# Patient Record
Sex: Female | Born: 1935
Health system: Southern US, Community
[De-identification: ages and names within clinical notes are randomized; demographics above are authoritative.]

## PROBLEM LIST (undated history)

## (undated) DIAGNOSIS — E739 Lactose intolerance, unspecified: Secondary | ICD-10-CM

## (undated) DIAGNOSIS — I10 Essential (primary) hypertension: Secondary | ICD-10-CM

## (undated) DIAGNOSIS — F32A Depression, unspecified: Secondary | ICD-10-CM

## (undated) DIAGNOSIS — Z87440 Personal history of urinary (tract) infections: Secondary | ICD-10-CM

## (undated) DIAGNOSIS — B019 Varicella without complication: Secondary | ICD-10-CM

## (undated) DIAGNOSIS — A630 Anogenital (venereal) warts: Secondary | ICD-10-CM

## (undated) DIAGNOSIS — K5792 Diverticulitis of intestine, part unspecified, without perforation or abscess without bleeding: Secondary | ICD-10-CM

## (undated) DIAGNOSIS — K589 Irritable bowel syndrome without diarrhea: Secondary | ICD-10-CM

## (undated) DIAGNOSIS — J301 Allergic rhinitis due to pollen: Secondary | ICD-10-CM

## (undated) DIAGNOSIS — M1990 Unspecified osteoarthritis, unspecified site: Secondary | ICD-10-CM

## (undated) DIAGNOSIS — K219 Gastro-esophageal reflux disease without esophagitis: Secondary | ICD-10-CM

## (undated) DIAGNOSIS — R32 Unspecified urinary incontinence: Secondary | ICD-10-CM

## (undated) DIAGNOSIS — J45909 Unspecified asthma, uncomplicated: Secondary | ICD-10-CM

## (undated) DIAGNOSIS — G629 Polyneuropathy, unspecified: Secondary | ICD-10-CM

## (undated) HISTORY — DX: Essential (primary) hypertension: I10

## (undated) HISTORY — DX: Varicella without complication: B01.9

## (undated) HISTORY — DX: Irritable bowel syndrome, unspecified: K58.9

## (undated) HISTORY — DX: Lactose intolerance, unspecified: E73.9

## (undated) HISTORY — PX: TONSILLECTOMY AND ADENOIDECTOMY: SUR1326

## (undated) HISTORY — DX: Gastro-esophageal reflux disease without esophagitis: K21.9

## (undated) HISTORY — DX: Unspecified asthma, uncomplicated: J45.909

## (undated) HISTORY — DX: Depression, unspecified: F32.A

## (undated) HISTORY — DX: Allergic rhinitis due to pollen: J30.1

## (undated) HISTORY — PX: PARTIAL COLECTOMY: SHX5273

## (undated) HISTORY — DX: Anogenital (venereal) warts: A63.0

## (undated) HISTORY — DX: Personal history of urinary (tract) infections: Z87.440

## (undated) HISTORY — DX: Diverticulitis of intestine, part unspecified, without perforation or abscess without bleeding: K57.92

## (undated) HISTORY — PX: ABDOMINAL HYSTERECTOMY: SHX81

## (undated) HISTORY — DX: Unspecified urinary incontinence: R32

## (undated) HISTORY — DX: Unspecified osteoarthritis, unspecified site: M19.90

---

## 2010-07-19 HISTORY — PX: OTHER SURGICAL HISTORY: SHX169

## 2018-07-19 HISTORY — PX: OTHER SURGICAL HISTORY: SHX169

## 2020-04-09 LAB — COMPREHENSIVE METABOLIC PANEL
Albumin: 4.5 (ref 3.5–5.0)
Calcium: 9.5 (ref 8.7–10.7)
GFR calc Af Amer: 70
GFR calc non Af Amer: 61
Globulin: 2

## 2020-04-09 LAB — BASIC METABOLIC PANEL
BUN: 12 (ref 4–21)
CO2: 26 — AB (ref 13–22)
Chloride: 100 (ref 99–108)
Creatinine: 0.9 (ref 0.5–1.1)
Glucose: 84
Potassium: 5 (ref 3.4–5.3)
Sodium: 141 (ref 137–147)

## 2020-04-09 LAB — HEPATIC FUNCTION PANEL
ALT: 13 (ref 7–35)
AST: 16 (ref 13–35)
Alkaline Phosphatase: 113 (ref 25–125)
Bilirubin, Total: 0.4

## 2020-04-09 LAB — CBC AND DIFFERENTIAL
HCT: 37 (ref 36–46)
Hemoglobin: 12.6 (ref 12.0–16.0)
Neutrophils Absolute: 4.7
Platelets: 245 (ref 150–399)
WBC: 7.9

## 2020-04-09 LAB — LIPID PANEL
Cholesterol: 114 (ref 0–200)
HDL: 54 (ref 35–70)
LDL Cholesterol: 42
Triglycerides: 93 (ref 40–160)

## 2020-04-09 LAB — CBC: RBC: 3.71 — AB (ref 3.87–5.11)

## 2020-09-25 ENCOUNTER — Other Ambulatory Visit (HOSPITAL_COMMUNITY): Payer: Self-pay | Admitting: Family Medicine

## 2020-09-26 MED FILL — clonazePAM 0.5 MG TABS: 0.5 | 30 days supply | Qty: 60 | Fill #0

## 2020-10-15 ENCOUNTER — Encounter: Payer: Self-pay | Admitting: Internal Medicine

## 2020-10-15 ENCOUNTER — Other Ambulatory Visit: Payer: Self-pay

## 2020-10-15 ENCOUNTER — Other Ambulatory Visit (HOSPITAL_COMMUNITY): Payer: Self-pay | Admitting: Internal Medicine

## 2020-10-15 ENCOUNTER — Ambulatory Visit (INDEPENDENT_AMBULATORY_CARE_PROVIDER_SITE_OTHER): Payer: Medicare Other | Admitting: Internal Medicine

## 2020-10-15 DIAGNOSIS — J4540 Moderate persistent asthma, uncomplicated: Secondary | ICD-10-CM

## 2020-10-15 DIAGNOSIS — K591 Functional diarrhea: Secondary | ICD-10-CM

## 2020-10-15 DIAGNOSIS — B009 Herpesviral infection, unspecified: Secondary | ICD-10-CM

## 2020-10-15 DIAGNOSIS — J3089 Other allergic rhinitis: Secondary | ICD-10-CM

## 2020-10-15 DIAGNOSIS — R630 Anorexia: Secondary | ICD-10-CM

## 2020-10-15 DIAGNOSIS — E782 Mixed hyperlipidemia: Secondary | ICD-10-CM

## 2020-10-15 DIAGNOSIS — F5101 Primary insomnia: Secondary | ICD-10-CM

## 2020-10-15 DIAGNOSIS — R251 Tremor, unspecified: Secondary | ICD-10-CM

## 2020-10-15 MED ORDER — FLUTICASONE FUROATE-VILANTEROL 100-25 MCG/INH IN AEPB: 1.0000 | INHALATION_SPRAY | Freq: Every day | RESPIRATORY_TRACT | 11 refills | Status: DC

## 2020-10-15 MED ORDER — VALACYCLOVIR HCL 1 G PO TABS: 1000.0000 mg | ORAL_TABLET | Freq: Every day | ORAL | 3 refills | Status: DC

## 2020-10-15 MED FILL — BREO ELLIPTA 100-25 MCG INH: 100-25 | 30 days supply | Qty: 60 | Fill #0

## 2020-10-15 MED FILL — valACYclovir HCL 1 GM TABS: 1 | 30 days supply | Qty: 30 | Fill #0

## 2020-10-15 NOTE — Progress Notes (Signed)
Subjective:   Patient ID: Tina Riley, female    DOB: [birth year], 87 y.o.   MRN: [id number]  HPI  The patient is a new 87 YO female coming in for ongoing care of her asthma (having SOB a lot of the time, coughing and sneezing a lot, denies hearing wheezing, she does not go out a lot so hard to tell if this limits activity, does not limit her around her apartment, she is prescribed breztri but this was not approved as it was for copd and not asthma so she did not get, she has symbicort which she uses 1 puff in the morning, does not really remember to use in the evening, does not have rescue inhaler currently uses symbicort for that, denies night time awakening for this, moved from [place] about a month ago and allergies have been worse since then) and allergies (taking xyzal and this seems to be doing okay but worse in the last month since moving here, denies fevers or chills, constant nose dripping and sneezing often, denies SOB or facial pressure) and tremor (has had for a long time and taking propranolol for this, overall feels it is getting worse, not sure she should still be taking this) and new problems with poor sleep (in an apartment and not used to living so close to others, one resident in particular is loud and keeping her up at night, this makes her a little anxious, she is then tired during the day, some napping, slept only 3 hours last night) and appetite (seems to be eating less, losing some weight, previously was on remeron and does not recall if that worked or not). Some other chronic medical conditions not overly discussed but medications reviewed indications for each with patient. Also with ongoing problem of diarrhea (colectomy in the 80s and since that time she is having diarrhea and stomach pains, has GI provider and saw them within 1 year, note reviewed, she takes bentyl and this helps, she is eating a lot of ice cream, daily currently, denies blood in diarrhea, no fevers or  chills) also chronic HSV (taking acyclovir 6 pills daily, sometimes forgets 1-2 of these, gets pain with outbreak).    PMH, [facility], social history reviewed and updated  Review of Systems  Constitutional: Positive for appetite change and fatigue.  HENT: Positive for postnasal drip and sneezing.   Eyes: Negative.   Respiratory: Positive for shortness of breath. Negative for apnea, cough, choking, chest tightness, wheezing and stridor.   Cardiovascular: Negative for chest pain, palpitations and leg swelling.  Gastrointestinal: Positive for abdominal pain and diarrhea. Negative for abdominal distention, anal bleeding, blood in stool, constipation, nausea, rectal pain and vomiting.  Musculoskeletal: Negative.   Skin: Negative.   Neurological: Positive for tremors.  Psychiatric/Behavioral: Positive for sleep disturbance.    Objective:  Physical Exam Constitutional:      Appearance: She is well-developed.     Comments: thin  HENT:     Head: Normocephalic and atraumatic.  Cardiovascular:     Rate and Rhythm: Normal rate and regular rhythm.  Pulmonary:     Effort: Pulmonary effort is normal. No respiratory distress.     Breath sounds: Normal breath sounds. No wheezing or rales.  Abdominal:     General: Bowel sounds are normal. There is no distension.     Palpations: Abdomen is soft. There is no mass.     Tenderness: There is abdominal tenderness. There is no right CVA tenderness, left CVA tenderness, guarding  or rebound.     Hernia: No hernia is present.  Musculoskeletal:     Cervical back: Normal range of motion.  Skin:    General: Skin is warm and dry.  Neurological:     Mental Status: She is alert and oriented to person, place, and time.     Coordination: Coordination abnormal.     Vitals:   10/15/20 1008  BP: 118/80  Pulse: 68  Resp: 18  Temp: 97.9 F (36.6 C)  TempSrc: Oral  SpO2: 98%  Weight: 103 lb 12.8 oz (47.1 kg)  Height: 5' (1.524 m)   This visit occurred during  the SARS-CoV-2 public health emergency.  Safety protocols were in place, including screening questions prior to the visit, additional usage of staff PPE, and extensive cleaning of exam room while observing appropriate contact time as indicated for disinfecting solutions.   Assessment & Plan:  Visit time 35 minutes in face to face communication with patient and coordination of care, additional 35 minutes spent in record review, coordination or care, ordering tests, communicating/referring to other healthcare professionals, documenting in medical records all on the same day of the visit for total time 70 minutes spent on the visit.

## 2020-10-15 NOTE — Patient Instructions (Addendum)
We have sent in breo for the asthma to use 1 puff daily. You can still use the symbicort as a rescue inhaler in case you feel like you cannot breathe.  We have sent in valtrex to replace the acyclovir. This medicine is 1 pill daily and prevents the herpes virus.  We could think about adding remeron (mirtazepine) to help with sleep and with appetite.   Work on reducing the dairy and oils from the diet to see if this helps with the stomach.

## 2020-10-17 ENCOUNTER — Encounter: Payer: Self-pay | Admitting: Internal Medicine

## 2020-10-17 DIAGNOSIS — J45909 Unspecified asthma, uncomplicated: Secondary | ICD-10-CM | POA: Insufficient documentation

## 2020-10-17 DIAGNOSIS — B009 Herpesviral infection, unspecified: Secondary | ICD-10-CM | POA: Insufficient documentation

## 2020-10-17 DIAGNOSIS — R251 Tremor, unspecified: Secondary | ICD-10-CM | POA: Insufficient documentation

## 2020-10-17 DIAGNOSIS — E785 Hyperlipidemia, unspecified: Secondary | ICD-10-CM | POA: Insufficient documentation

## 2020-10-17 DIAGNOSIS — R197 Diarrhea, unspecified: Secondary | ICD-10-CM | POA: Insufficient documentation

## 2020-10-17 DIAGNOSIS — R630 Anorexia: Secondary | ICD-10-CM | POA: Insufficient documentation

## 2020-10-17 DIAGNOSIS — G4700 Insomnia, unspecified: Secondary | ICD-10-CM | POA: Insufficient documentation

## 2020-10-17 DIAGNOSIS — J309 Allergic rhinitis, unspecified: Secondary | ICD-10-CM | POA: Insufficient documentation

## 2020-10-17 NOTE — Assessment & Plan Note (Signed)
Suspect this may partially be adjustment to [place] and apartment living. Offered remeron to help and they will think about this.

## 2020-10-17 NOTE — Assessment & Plan Note (Signed)
She is not on controller medication currently. Rx breo to take 1 puff daily as she does not seem able to do BID medication. Continue symbicort as rescue for now and see her back in 1 month. Adjust as needed then.

## 2020-10-17 NOTE — Assessment & Plan Note (Signed)
Suspect essential tremor and she is on appropriate propranolol. Advised to continue this for now. Given other more urgent concerns we did not get in depth with this today and will follow up on this in 1 month. Given HR 68 dose increase of propranolol is not appropriate. We did talk briefly about how typically essential tremor is chronic and progressive in nature. Not noticeable on exam.

## 2020-10-17 NOTE — Assessment & Plan Note (Signed)
Taking crestor 5 mg daily. Checking records to see when labs are due for monitoring.

## 2020-10-17 NOTE — Assessment & Plan Note (Signed)
Taking xyzal and given other changes will wait on changes with this. It is pollen season and so worsening symptoms are not unusual.

## 2020-10-17 NOTE — Assessment & Plan Note (Signed)
Will change acyclovir to valtrex for less pills and simpler regimen of 1000 mg daily.

## 2020-10-17 NOTE — Assessment & Plan Note (Signed)
Due to extensive other conditions discussed today it was not possible to take an extensive history of this problem. She is still taking bentyl 4 times a day. Advised to stop dairy products to see if this helps. Will get records to clarify colectomy 80s reason. No signs of diverticulitis or infection today based on history.

## 2020-10-17 NOTE — Assessment & Plan Note (Signed)
Offered to resume remeron and they will think about this. We can discuss again at follow up in 1-2 months.

## 2020-10-24 ENCOUNTER — Encounter: Payer: Self-pay | Admitting: Internal Medicine

## 2020-10-27 ENCOUNTER — Other Ambulatory Visit (HOSPITAL_COMMUNITY): Payer: Self-pay

## 2020-10-27 MED ORDER — MOMETASONE FURO-FORMOTEROL FUM 100-5 MCG/ACT IN AERO
2.0000 | INHALATION_SPRAY | Freq: Two times a day (BID) | RESPIRATORY_TRACT | 5 refills | Status: DC
  Filled 2020-10-27: qty 13, 30d supply, fill #0

## 2020-10-29 ENCOUNTER — Other Ambulatory Visit (HOSPITAL_COMMUNITY): Payer: Self-pay

## 2020-10-29 ENCOUNTER — Telehealth: Payer: Self-pay | Admitting: Internal Medicine

## 2020-10-29 NOTE — Telephone Encounter (Signed)
    Patient states she does not plan on taking mometasone-formoterol (DULERA) 100-5 MCG/ACT AERO  She is fearful of possible side effects. Patient requesting Symbicort order  Please call

## 2020-11-03 ENCOUNTER — Other Ambulatory Visit (HOSPITAL_COMMUNITY): Payer: Self-pay

## 2020-11-03 MED ORDER — SYMBICORT 160-4.5 MCG/ACT IN AERO
2.0000 | INHALATION_SPRAY | Freq: Two times a day (BID) | RESPIRATORY_TRACT | 11 refills | Status: DC
  Filled 2020-11-03: qty 10.2, 30d supply, fill #0
  Filled 2020-12-28: qty 10.2, 30d supply, fill #1
  Filled 2021-03-05: qty 10.2, 30d supply, fill #2
  Filled 2021-06-05: qty 10.2, 30d supply, fill #3

## 2020-11-03 NOTE — Telephone Encounter (Signed)
See below

## 2020-11-03 NOTE — Telephone Encounter (Signed)
Ok,placed

## 2020-11-06 ENCOUNTER — Encounter: Payer: Self-pay | Admitting: Internal Medicine

## 2020-11-06 ENCOUNTER — Other Ambulatory Visit (HOSPITAL_COMMUNITY): Payer: Self-pay

## 2020-11-07 ENCOUNTER — Other Ambulatory Visit (HOSPITAL_COMMUNITY): Payer: Self-pay

## 2020-11-07 MED ORDER — DICYCLOMINE HCL 10 MG PO CAPS
10.0000 mg | ORAL_CAPSULE | Freq: Two times a day (BID) | ORAL | 0 refills | Status: DC | PRN
  Filled 2020-11-07: qty 40, 20d supply, fill #0

## 2020-11-20 ENCOUNTER — Other Ambulatory Visit (HOSPITAL_COMMUNITY): Payer: Self-pay

## 2020-11-20 MED FILL — Clonazepam Tab 0.5 MG: ORAL | 30 days supply | Qty: 60 | Fill #0 | Status: AC

## 2020-11-24 ENCOUNTER — Telehealth: Payer: Self-pay | Admitting: Internal Medicine

## 2020-11-24 ENCOUNTER — Other Ambulatory Visit (HOSPITAL_COMMUNITY): Payer: Self-pay

## 2020-11-24 ENCOUNTER — Encounter: Payer: Self-pay | Admitting: Internal Medicine

## 2020-11-24 MED FILL — Valacyclovir HCl Tab 1 GM: ORAL | 90 days supply | Qty: 90 | Fill #0 | Status: AC

## 2020-11-24 NOTE — Telephone Encounter (Signed)
Ok to refill? I don't see where you have prescribed the medication that is being requested.

## 2020-11-24 NOTE — Telephone Encounter (Signed)
rosuvastatin (CRESTOR) 5 MG tablet gabapentin (NEURONTIN) 100 MG capsule propranolol ER (INDERAL LA) 60 MG 24 hr capsule levocetirizine (XYZAL) 5 MG tablet  [facility] Phone:  [phone]  Fax:  [phone]     Last seen- 03.30.22 Next apt- 06.06.22

## 2020-11-25 ENCOUNTER — Other Ambulatory Visit (HOSPITAL_COMMUNITY): Payer: Self-pay

## 2020-11-25 MED ORDER — LEVOCETIRIZINE DIHYDROCHLORIDE 5 MG PO TABS
5.0000 mg | ORAL_TABLET | Freq: Every day | ORAL | 3 refills | Status: DC
  Filled 2020-11-25: qty 90, 90d supply, fill #0
  Filled 2021-02-16: qty 90, 90d supply, fill #1
  Filled 2021-04-19 – 2021-04-27 (×2): qty 90, 90d supply, fill #2

## 2020-11-25 MED ORDER — ROSUVASTATIN CALCIUM 5 MG PO TABS
5.0000 mg | ORAL_TABLET | Freq: Every day | ORAL | 3 refills | Status: DC
  Filled 2020-11-25: qty 90, 90d supply, fill #0
  Filled 2021-02-16: qty 90, 90d supply, fill #1
  Filled 2021-04-19 – 2021-04-27 (×2): qty 90, 90d supply, fill #2

## 2020-11-25 MED ORDER — PROPRANOLOL HCL ER 60 MG PO CP24
60.0000 mg | ORAL_CAPSULE | Freq: Every day | ORAL | 1 refills | Status: DC
  Filled 2020-11-25 (×3): qty 90, 90d supply, fill #0
  Filled 2021-02-16: qty 90, 90d supply, fill #1

## 2020-11-25 MED ORDER — GABAPENTIN 100 MG PO CAPS
100.0000 mg | ORAL_CAPSULE | Freq: Three times a day (TID) | ORAL | 0 refills | Status: DC
  Filled 2020-11-25: qty 270, 90d supply, fill #0

## 2020-11-25 NOTE — Addendum Note (Signed)
Addended by: Hillard Danker A on: 11/25/2020 09:05 AM   Modules accepted: Orders

## 2020-12-16 ENCOUNTER — Ambulatory Visit: Payer: Medicare Other | Admitting: Internal Medicine

## 2020-12-22 ENCOUNTER — Ambulatory Visit (INDEPENDENT_AMBULATORY_CARE_PROVIDER_SITE_OTHER): Payer: Medicare Other | Admitting: Internal Medicine

## 2020-12-22 ENCOUNTER — Encounter: Payer: Self-pay | Admitting: Internal Medicine

## 2020-12-22 ENCOUNTER — Other Ambulatory Visit: Payer: Self-pay

## 2020-12-22 VITALS — BP 122/70 | HR 62 | Temp 98.6°F | Resp 18 | Ht 60.0 in | Wt 97.6 lb

## 2020-12-22 DIAGNOSIS — E2839 Other primary ovarian failure: Secondary | ICD-10-CM

## 2020-12-22 DIAGNOSIS — K591 Functional diarrhea: Secondary | ICD-10-CM

## 2020-12-22 DIAGNOSIS — L659 Nonscarring hair loss, unspecified: Secondary | ICD-10-CM

## 2020-12-22 LAB — CBC
HCT: 37.3 % (ref 36.0–46.0)
Hemoglobin: 12.4 g/dL (ref 12.0–15.0)
MCHC: 33.3 g/dL (ref 30.0–36.0)
MCV: 100.4 fl — ABNORMAL HIGH (ref 78.0–100.0)
Platelets: 289 10*3/uL (ref 150.0–400.0)
RBC: 3.72 Mil/uL — ABNORMAL LOW (ref 3.87–5.11)
RDW: 13.4 % (ref 11.5–15.5)
WBC: 9.9 10*3/uL (ref 4.0–10.5)

## 2020-12-22 LAB — VITAMIN B12: Vitamin B-12: 243 pg/mL (ref 211–911)

## 2020-12-22 LAB — COMPREHENSIVE METABOLIC PANEL
ALT: 8 U/L (ref 0–35)
AST: 13 U/L (ref 0–37)
Albumin: 4 g/dL (ref 3.5–5.2)
Alkaline Phosphatase: 83 U/L (ref 39–117)
BUN: 14 mg/dL (ref 6–23)
CO2: 27 mEq/L (ref 19–32)
Calcium: 9.3 mg/dL (ref 8.4–10.5)
Chloride: 102 mEq/L (ref 96–112)
Creatinine, Ser: 0.94 mg/dL (ref 0.40–1.20)
GFR: 54.6 mL/min — ABNORMAL LOW (ref >60.00)
Glucose, Bld: 88 mg/dL (ref 70–99)
Potassium: 5.2 mEq/L — ABNORMAL HIGH (ref 3.5–5.1)
Sodium: 137 mEq/L (ref 135–145)
Total Bilirubin: 0.4 mg/dL (ref 0.2–1.2)
Total Protein: 6.8 g/dL (ref 6.0–8.3)

## 2020-12-22 LAB — VITAMIN D 25 HYDROXY (VIT D DEFICIENCY, FRACTURES): VITD: 27.17 ng/mL — ABNORMAL LOW (ref 30.00–100.00)

## 2020-12-22 LAB — TSH: TSH: 5.97 u[IU]/mL — ABNORMAL HIGH (ref 0.35–4.50)

## 2020-12-22 NOTE — Progress Notes (Signed)
   Subjective:   Patient ID: Tina Riley, female    DOB: 1931/09/07, 87 y.o.   MRN: [id number]  HPI The patient is an 87 YO female coming in for concerns about ongoing diarrhea (did not have time to discuss at last visit, chronic since bowel surgeries, has tried imodium and this helped for a few days, did not want to overuse, did not try stopping dairy as we discussed last time) and hair thinning (going on awhile noticed in the last 2-3 months more).   Review of Systems  Constitutional:  Positive for fatigue.  HENT: Negative.    Eyes: Negative.   Respiratory:  Negative for cough, chest tightness and shortness of breath.   Cardiovascular:  Negative for chest pain, palpitations and leg swelling.  Gastrointestinal:  Positive for diarrhea. Negative for abdominal distention, abdominal pain, constipation, nausea and vomiting.  Musculoskeletal: Negative.   Skin: Negative.   Neurological: Negative.   Psychiatric/Behavioral: Negative.     Objective:  Physical Exam Constitutional:      Appearance: She is well-developed.  HENT:     Head: Normocephalic and atraumatic.  Cardiovascular:     Rate and Rhythm: Normal rate and regular rhythm.  Pulmonary:     Effort: Pulmonary effort is normal. No respiratory distress.     Breath sounds: Normal breath sounds. No wheezing or rales.  Abdominal:     General: Bowel sounds are normal. There is no distension.     Palpations: Abdomen is soft.     Tenderness: There is no abdominal tenderness. There is no rebound.  Musculoskeletal:     Cervical back: Normal range of motion.  Skin:    General: Skin is warm and dry.  Neurological:     Mental Status: She is alert and oriented to person, place, and time.     Coordination: Coordination normal.    Vitals:   12/22/20 0817  BP: 122/70  Pulse: 62  Resp: 18  Temp: 98.6 F (37 C)  TempSrc: Oral  SpO2: 96%  Weight: 97 lb 9.6 oz (44.3 kg)  Height: 5' (1.524 m)    This visit occurred during the  SARS-CoV-2 public health emergency.  Safety protocols were in place, including screening questions prior to the visit, additional usage of staff PPE, and extensive cleaning of exam room while observing appropriate contact time as indicated for disinfecting solutions.   Assessment & Plan:

## 2020-12-22 NOTE — Patient Instructions (Addendum)
You can take the imodium 2-3 times per week to help with the diarrhea.  We will check the labs to see if we can find a cause for the hair thinning.

## 2020-12-25 DIAGNOSIS — L659 Nonscarring hair loss, unspecified: Secondary | ICD-10-CM | POA: Insufficient documentation

## 2020-12-25 NOTE — Assessment & Plan Note (Signed)
Suspect that this is from prior surgeries upon hearing more history. She got good relief with imodium so asked her to schedule this 2-3 times per week. Also if she wants to try dairy avoidance to see if this helps for 2-3 weeks.

## 2020-12-25 NOTE — Assessment & Plan Note (Signed)
Checking TSH, vitamin D and B12. Advised that beta blocker could be the cause but since this is helping more with tremor I would recommend to continue it.

## 2020-12-28 ENCOUNTER — Encounter: Payer: Self-pay | Admitting: Internal Medicine

## 2020-12-28 MED FILL — Clonazepam Tab 0.5 MG: ORAL | 30 days supply | Qty: 60 | Fill #1 | Status: AC

## 2020-12-29 ENCOUNTER — Other Ambulatory Visit (HOSPITAL_COMMUNITY): Payer: Self-pay

## 2021-01-05 ENCOUNTER — Other Ambulatory Visit: Payer: Self-pay | Admitting: Internal Medicine

## 2021-01-07 ENCOUNTER — Other Ambulatory Visit (HOSPITAL_COMMUNITY): Payer: Self-pay

## 2021-01-07 MED ORDER — DICYCLOMINE HCL 10 MG PO CAPS
10.0000 mg | ORAL_CAPSULE | Freq: Two times a day (BID) | ORAL | 0 refills | Status: DC | PRN
  Filled 2021-01-07: qty 40, 20d supply, fill #0

## 2021-01-16 ENCOUNTER — Encounter: Payer: Self-pay | Admitting: Internal Medicine

## 2021-01-16 ENCOUNTER — Other Ambulatory Visit (HOSPITAL_COMMUNITY): Payer: Self-pay

## 2021-01-16 MED ORDER — DICYCLOMINE HCL 10 MG PO CAPS
10.0000 mg | ORAL_CAPSULE | Freq: Two times a day (BID) | ORAL | 3 refills | Status: DC | PRN
  Filled 2021-01-16: qty 180, 90d supply, fill #0
  Filled 2021-04-27: qty 180, 90d supply, fill #1

## 2021-01-22 ENCOUNTER — Other Ambulatory Visit (HOSPITAL_COMMUNITY): Payer: Self-pay

## 2021-02-11 ENCOUNTER — Encounter: Payer: Self-pay | Admitting: Internal Medicine

## 2021-02-11 DIAGNOSIS — K591 Functional diarrhea: Secondary | ICD-10-CM

## 2021-02-16 ENCOUNTER — Other Ambulatory Visit (HOSPITAL_COMMUNITY): Payer: Self-pay

## 2021-02-16 ENCOUNTER — Other Ambulatory Visit: Payer: Self-pay

## 2021-02-17 ENCOUNTER — Other Ambulatory Visit (HOSPITAL_COMMUNITY): Payer: Self-pay

## 2021-02-17 ENCOUNTER — Other Ambulatory Visit: Payer: Self-pay | Admitting: Internal Medicine

## 2021-02-18 ENCOUNTER — Other Ambulatory Visit: Payer: Self-pay | Admitting: Internal Medicine

## 2021-02-18 ENCOUNTER — Other Ambulatory Visit (HOSPITAL_COMMUNITY): Payer: Self-pay

## 2021-02-18 NOTE — Telephone Encounter (Signed)
I'm not sure why she is taking this medication and we have not talked about this due to other medical concerns at our prior visits. Can we offer her appointment to discuss?

## 2021-02-18 NOTE — Telephone Encounter (Signed)
This has not been prescribed by you in the past. Last RF per controlled substance database: 12/29/20 Last OV: 12/22/20 Next OV: 05/04/21

## 2021-02-19 ENCOUNTER — Other Ambulatory Visit (HOSPITAL_COMMUNITY): Payer: Self-pay

## 2021-02-19 ENCOUNTER — Other Ambulatory Visit: Payer: Self-pay | Admitting: Internal Medicine

## 2021-02-23 ENCOUNTER — Other Ambulatory Visit (HOSPITAL_COMMUNITY): Payer: Self-pay

## 2021-02-23 ENCOUNTER — Other Ambulatory Visit: Payer: Self-pay

## 2021-02-23 ENCOUNTER — Ambulatory Visit (INDEPENDENT_AMBULATORY_CARE_PROVIDER_SITE_OTHER): Payer: Medicare Other | Admitting: Internal Medicine

## 2021-02-23 ENCOUNTER — Encounter: Payer: Self-pay | Admitting: Internal Medicine

## 2021-02-23 DIAGNOSIS — F419 Anxiety disorder, unspecified: Secondary | ICD-10-CM | POA: Diagnosis not present

## 2021-02-23 DIAGNOSIS — K591 Functional diarrhea: Secondary | ICD-10-CM | POA: Diagnosis not present

## 2021-02-23 DIAGNOSIS — K219 Gastro-esophageal reflux disease without esophagitis: Secondary | ICD-10-CM

## 2021-02-23 MED ORDER — CLONAZEPAM 0.5 MG PO TABS
0.5000 mg | ORAL_TABLET | Freq: Two times a day (BID) | ORAL | 5 refills | Status: DC | PRN
  Filled 2021-02-23: qty 60, 30d supply, fill #0
  Filled 2021-04-10: qty 60, 30d supply, fill #1
  Filled 2021-05-25: qty 60, 30d supply, fill #2
  Filled 2021-07-18: qty 60, 30d supply, fill #3

## 2021-02-23 MED ORDER — PANTOPRAZOLE SODIUM 40 MG PO TBEC
40.0000 mg | DELAYED_RELEASE_TABLET | Freq: Two times a day (BID) | ORAL | 3 refills | Status: DC
  Filled 2021-02-23: qty 180, 90d supply, fill #0
  Filled 2021-04-27 – 2021-06-12 (×2): qty 180, 90d supply, fill #1
  Filled 2021-12-14: qty 180, 90d supply, fill #2

## 2021-02-23 NOTE — Patient Instructions (Addendum)
We have refilled the protonix (pantoprazole) and the clonazepam today.  Try metamucil for the bowels regular for 1-2 weeks.

## 2021-02-23 NOTE — Progress Notes (Signed)
   Subjective:   Patient ID: Tina Riley, female    DOB: 02/02/1932, 87 y.o.   MRN: [id number]  HPI The patient is an 87 YO female coming in for anxiety. We had not discussed in depth previously due to other more concerning health concerns. Needs refill clonazepam which she uses for anxiety mostly in the evening which is when her anxiety is worse. Mostly 1 per day, rarely 2 per day. She has a lot of anxiety about her "colon attacks" with diarrhea. Has not taken imodium scheduled as suggested as she is concerned about SBO and constipation. Used to take fiber daily and is not. Cannot remember if this helped with her diarrhea. Some changes to diet.   Review of Systems  Constitutional: Negative.   HENT: Negative.    Eyes: Negative.   Respiratory:  Negative for cough, chest tightness and shortness of breath.   Cardiovascular:  Negative for chest pain, palpitations and leg swelling.  Gastrointestinal:  Positive for diarrhea. Negative for abdominal distention, abdominal pain, constipation, nausea and vomiting.  Musculoskeletal:  Positive for arthralgias.  Skin: Negative.   Neurological: Negative.   Psychiatric/Behavioral:  Positive for sleep disturbance. The patient is nervous/anxious.    Objective:  Physical Exam Constitutional:      Appearance: She is well-developed.     Comments: Chronically ill appearing, thin  HENT:     Head: Normocephalic and atraumatic.  Cardiovascular:     Rate and Rhythm: Normal rate and regular rhythm.  Pulmonary:     Effort: Pulmonary effort is normal. No respiratory distress.     Breath sounds: Normal breath sounds. No wheezing or rales.  Abdominal:     General: Bowel sounds are normal. There is no distension.     Palpations: Abdomen is soft.     Tenderness: There is no abdominal tenderness. There is no rebound.  Musculoskeletal:     Cervical back: Normal range of motion.  Skin:    General: Skin is warm and dry.  Neurological:     Mental Status:  She is alert and oriented to person, place, and time.     Coordination: Coordination abnormal.     Comments: Wheelchair for long distances    Vitals:   02/23/21 1516  BP: 120/80  Pulse: 63  Resp: 18  Temp: 98.3 F (36.8 C)  TempSrc: Oral  SpO2: 98%  Weight: 97 lb 6.4 oz (44.2 kg)  Height: 5' (1.524 m)    This visit occurred during the SARS-CoV-2 public health emergency.  Safety protocols were in place, including screening questions prior to the visit, additional usage of staff PPE, and extensive cleaning of exam room while observing appropriate contact time as indicated for disinfecting solutions.   Assessment & Plan:

## 2021-02-24 DIAGNOSIS — K219 Gastro-esophageal reflux disease without esophagitis: Secondary | ICD-10-CM | POA: Insufficient documentation

## 2021-02-24 DIAGNOSIS — F419 Anxiety disorder, unspecified: Secondary | ICD-10-CM | POA: Insufficient documentation

## 2021-02-24 NOTE — Assessment & Plan Note (Signed)
Needs refill on protonix which she takes BID. This was done today. Counseled about trying to cut back to daily and she will consider but has tried this in the past with recurrent symptoms.

## 2021-02-24 NOTE — Assessment & Plan Note (Signed)
She has been taking clonazepam for many years and typically daily rare BID. Review of [place] narcotic database supports this history. Counseled about risk for memory change and increased fall risk. She is aware and wishes to continue. Refilled today. Counseled about other options long term for anxiety and she does not wish to add medications as she is very sensitive to side effects and would be fearful of a negative reaction.

## 2021-02-24 NOTE — Assessment & Plan Note (Signed)
Pending GI referral. She is reluctant to do imodium scheduled twice a week due to concerns about not going and SBO. She is advised to try fiber daily to help with lessening diarrhea.

## 2021-02-25 ENCOUNTER — Telehealth: Payer: Self-pay | Admitting: Internal Medicine

## 2021-02-25 NOTE — Telephone Encounter (Signed)
I will see her 

## 2021-02-25 NOTE — Telephone Encounter (Unsigned)
Hi Dr. Leone Payor,  D.O.D   We received a referral for patient to be seen for  Functional diarrhea. She is currently a patient over at [facility] and moved to [facility] seeking a local GI provider. Records are available in Epic for you to review.   Please advise on scheduling   Thanks

## 2021-02-27 ENCOUNTER — Encounter: Payer: Self-pay | Admitting: Internal Medicine

## 2021-03-05 ENCOUNTER — Other Ambulatory Visit: Payer: Self-pay | Admitting: Internal Medicine

## 2021-03-05 ENCOUNTER — Other Ambulatory Visit (HOSPITAL_COMMUNITY): Payer: Self-pay

## 2021-03-05 MED ORDER — GABAPENTIN 100 MG PO CAPS
100.0000 mg | ORAL_CAPSULE | Freq: Three times a day (TID) | ORAL | 0 refills | Status: DC
  Filled 2021-03-05: qty 270, 90d supply, fill #0

## 2021-03-18 ENCOUNTER — Other Ambulatory Visit (HOSPITAL_COMMUNITY): Payer: Self-pay

## 2021-04-10 ENCOUNTER — Other Ambulatory Visit (HOSPITAL_COMMUNITY): Payer: Self-pay

## 2021-04-17 ENCOUNTER — Other Ambulatory Visit (HOSPITAL_COMMUNITY): Payer: Self-pay

## 2021-04-19 MED FILL — Valacyclovir HCl Tab 1 GM: ORAL | 90 days supply | Qty: 90 | Fill #1 | Status: AC

## 2021-04-20 ENCOUNTER — Other Ambulatory Visit (HOSPITAL_COMMUNITY): Payer: Self-pay

## 2021-04-20 DIAGNOSIS — Z9181 History of falling: Secondary | ICD-10-CM | POA: Diagnosis not present

## 2021-04-20 DIAGNOSIS — R262 Difficulty in walking, not elsewhere classified: Secondary | ICD-10-CM | POA: Diagnosis not present

## 2021-04-27 ENCOUNTER — Other Ambulatory Visit (HOSPITAL_COMMUNITY): Payer: Self-pay

## 2021-04-28 ENCOUNTER — Other Ambulatory Visit (HOSPITAL_COMMUNITY): Payer: Self-pay

## 2021-04-29 ENCOUNTER — Other Ambulatory Visit (HOSPITAL_COMMUNITY): Payer: Self-pay

## 2021-05-04 ENCOUNTER — Ambulatory Visit (INDEPENDENT_AMBULATORY_CARE_PROVIDER_SITE_OTHER): Payer: Medicare Other | Admitting: Internal Medicine

## 2021-05-04 ENCOUNTER — Other Ambulatory Visit (HOSPITAL_COMMUNITY): Payer: Self-pay

## 2021-05-04 ENCOUNTER — Other Ambulatory Visit: Payer: Self-pay

## 2021-05-04 ENCOUNTER — Encounter: Payer: Self-pay | Admitting: Internal Medicine

## 2021-05-04 VITALS — BP 124/72 | HR 68 | Resp 18 | Ht 60.0 in | Wt 101.0 lb

## 2021-05-04 DIAGNOSIS — J4540 Moderate persistent asthma, uncomplicated: Secondary | ICD-10-CM | POA: Diagnosis not present

## 2021-05-04 DIAGNOSIS — L659 Nonscarring hair loss, unspecified: Secondary | ICD-10-CM | POA: Diagnosis not present

## 2021-05-04 DIAGNOSIS — Z23 Encounter for immunization: Secondary | ICD-10-CM

## 2021-05-04 DIAGNOSIS — K591 Functional diarrhea: Secondary | ICD-10-CM

## 2021-05-04 MED ORDER — ALBUTEROL SULFATE HFA 108 (90 BASE) MCG/ACT IN AERS
2.0000 | INHALATION_SPRAY | Freq: Four times a day (QID) | RESPIRATORY_TRACT | 2 refills | Status: DC | PRN
  Filled 2021-05-04: qty 8.5, 25d supply, fill #0

## 2021-05-04 NOTE — Patient Instructions (Addendum)
We have sent in the albuterol to use for tightness in the chest.  The EKG done today was normal.

## 2021-05-04 NOTE — Progress Notes (Signed)
   Subjective:   Patient ID: Tina Riley, female    DOB: August 08, 1931, 87 y.o.   MRN: [id number]  HPI The patient is an 87 YO female coming in for several concerns.  Review of Systems  Constitutional:  Positive for appetite change.  HENT: Negative.    Eyes: Negative.   Respiratory:  Positive for shortness of breath. Negative for cough and chest tightness.   Cardiovascular:  Negative for chest pain, palpitations and leg swelling.  Gastrointestinal:  Positive for diarrhea. Negative for abdominal distention, abdominal pain, constipation, nausea and vomiting.  Musculoskeletal:  Positive for arthralgias.  Skin: Negative.   Neurological: Negative.   Psychiatric/Behavioral: Negative.     Objective:  Physical Exam Constitutional:      Appearance: She is well-developed.  HENT:     Head: Normocephalic and atraumatic.  Cardiovascular:     Rate and Rhythm: Normal rate and regular rhythm.  Pulmonary:     Effort: Pulmonary effort is normal. No respiratory distress.     Breath sounds: Normal breath sounds. No wheezing or rales.  Abdominal:     General: Bowel sounds are normal. There is no distension.     Palpations: Abdomen is soft.     Tenderness: There is no abdominal tenderness. There is no rebound.  Musculoskeletal:     Cervical back: Normal range of motion.  Skin:    General: Skin is warm and dry.  Neurological:     Mental Status: She is alert and oriented to person, place, and time.     Coordination: Coordination abnormal.     Comments: wheelchair    Vitals:   05/04/21 1441  BP: 124/72  Pulse: 68  Resp: 18  SpO2: 98%  Weight: 101 lb (45.8 kg)  Height: 5' (1.524 m)   EKG: Rate 73, axis normal, interval normal, sinus, no st or t wave changes, no prior to compare in our system, last reported normal by patient at prior doctor  This visit occurred during the SARS-CoV-2 public health emergency.  Safety protocols were in place, including screening questions prior to the  visit, additional usage of staff PPE, and extensive cleaning of exam room while observing appropriate contact time as indicated for disinfecting solutions.   Assessment & Plan:  Flu shot given at visit  Visit time 25 minutes in face to face communication with patient and coordination of care, additional 15 minutes spent in record review, coordination or care, ordering tests, communicating/referring to other healthcare professionals, documenting in medical records all on the same day of the visit for total time 40 minutes spent on the visit.

## 2021-05-06 ENCOUNTER — Encounter: Payer: Self-pay | Admitting: Internal Medicine

## 2021-05-06 ENCOUNTER — Ambulatory Visit: Payer: Medicare Other | Admitting: Internal Medicine

## 2021-05-06 VITALS — BP 120/70 | HR 59 | Ht 60.0 in | Wt 101.0 lb

## 2021-05-06 DIAGNOSIS — K582 Mixed irritable bowel syndrome: Secondary | ICD-10-CM

## 2021-05-06 DIAGNOSIS — R159 Full incontinence of feces: Secondary | ICD-10-CM

## 2021-05-06 NOTE — Assessment & Plan Note (Signed)
EKG done to assess some worsening SOB which was unchanged from prior. She will continue symbicort and rx for albuterol prn done today. If no improvement she will let us know and we may need change to symbicort.

## 2021-05-06 NOTE — Patient Instructions (Signed)
Try one tablespoon of Benefiber daily and may go up to 2 tablespoons if needed. Handout provided.   The goal is to have regular bowel movements and to avoid explosions.    I appreciate the opportunity to care for you. Stan Head, MD, [facility]

## 2021-05-06 NOTE — Progress Notes (Signed)
Tina Riley 87 y.o. [birth year] [id number]  Assessment & Plan:   Encounter Diagnoses  Name Primary?   Irritable bowel syndrome with both constipation and diarrhea Yes   Full incontinence of feces     She will add Benefiber 1 tablespoon daily and titrate for effect to see if this alleviates her problems.  She will continue with her dietary modifications.  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  I think this is likely the best we can do at this point and she will see me as needed.  CC: Myrlene Broker, MD   Subjective:   Chief Complaint:  HPI 87 year old white woman here with her nephew Tina Riley, because of bowel habit issues that include fecal incontinence and diarrhea.  She has many many years of "colon spasms".  Also calls them "colon attacks".  She was last seen at [facility] in 2021.  I have pasted the HPI from that note.  01/03/20 GI visit Dr. Charlean Sanfilippo HPI:   87 year old female with history of irritable bowel syndrome mixed who presents for follow-up evaluation. She previously followed with Dr. Glean Hess.  The patient was seen in 2018. At that time it was recommended for her to have a KUB, a trial of MiraLAX 17 g daily and also trial of doxepin. The patient declined any of these recommendations. Therefore she was restarted on Bentyl 20 mg every 6 hours as needed and recommended to use Senokot as needed.  At follow-up she reported doing well on the Bentyl. Her bowels seem to be primarily on the constipation side but would alternate 2 loose stools on occasion. She declined to add a fiber supplement due to out-of-pocket cost.  In 2019 she had a full day after asked GI panel which was negative. She had a colonoscopy in 10/2017 with normal mucosa and an end-to-side anastomosis with random biopsies negative for microscopic colitis. She was started on colestipol. She  reported ongoing issues with postprandial diarrhea. She continues to take pantoprazole 20 mg daily for GERD.  She reports that recently her reflux, heartburn and epigastric pain symptoms have been bothering her more frequently. She has been taking pantoprazole 40 mg daily. This seems to control her symptoms well throughout the morning and afternoon, but she is noticing on occasion that she is developing symptoms in the evening around mealtime. She has not taking anything additional for this at this time. No dysphagia, odynophagia or weight loss. She reports that her bowel habits have been pretty comfortable recently. She is denying any overt issues with diarrhea or constipation at this time. She does admit to some gas without bloating. She has not required taking laxatives recently. She does report having some issues with intermittent abdominal cramping. These seem to be the same types of pains that she has had over the past, and they continue to respond quite well to Bentyl. She is taking this medication as needed but rarely requires it more than once or twice a week. She notices that certain foods, especially lactose-containing foods make her symptoms worse. She also mentioned that on one occasion she had an issue with some hemorrhoidal pain and bleeding after an episode of constipation. This has since resolved but she was wondering about what she could do to prevent this from bothering her again in the future  Today after moving over to [facility] this spring, she reports that she continues to have episodic problems.  She notes that she does better if she avoids lactose.  Right now its been about 2 weeks since she has had a "colon attack".  What those are as are episodes of numerous bowel movements with a risk of fecal incontinence.  She cannot trust flatus often.  She has chronic urinary leakage as well.  She does have a history of what sounds like a right hemicolectomy though we are not sure that is  suspected.  She had a bowel obstruction in the 1980s.  She thinks that if she is careful with her diet though she cannot be more specific than that and avoiding lactose that she is better.  She rarely uses Imodium to help when she is having diarrhea.  She is seeking potential benefit though understands that she may not be able to see much change in these patterns.  She tends to move her bowels regularly most of the time but would like to minimize or eliminate these "colon attacks".  She does say that in the past she thinks Metamucil helped her but "I hate taking it".  So she stopped that.  Appetite is generally okay.  She is frail and very fearful of falling and uses a wheelchair or a walker at all times.  I do not know when she last had a colonoscopy it was decades ago.  TSH was mildly elevated at 5.97 in June hemoglobin was normal MCV 100.4 B12 level 243 Allergies  Allergen Reactions   Cephalexin Palpitations   Ciprofloxacin Palpitations   Other Itching   Meperidine Hcl     Other reaction(s): Other Doesn't work   Sulfa Antibiotics Nausea And Vomiting   Cefdinir Nausea Only   Doxycycline Nausea Only   Current Meds  Medication Sig   acetaminophen (TYLENOL) 500 MG tablet Take 500 mg by mouth 2 (two) times daily as needed.   albuterol (VENTOLIN HFA) 108 (90 Base) MCG/ACT inhaler Inhale 2 puffs into the lungs every 6 (six) hours as needed for wheezing or shortness of breath.   aspirin EC 81 MG tablet Take 81 mg by mouth daily. Swallow whole.   clonazePAM (KLONOPIN) 0.5 MG tablet Take 1 tablet (0.5 mg total) by mouth 2 (two) times daily as needed for anxiety   dicyclomine (BENTYL) 10 MG capsule Take 1 capsule (10 mg total) by mouth 2 (two) times daily as needed.   gabapentin (NEURONTIN) 100 MG capsule Take 1 capsule (100 mg total) by mouth 3 (three) times daily.   levocetirizine (XYZAL) 5 MG tablet Take 1 tablet (5 mg total) by mouth daily.   pantoprazole (PROTONIX) 40 MG tablet Take 1 tablet  (40 mg total) by mouth 2 (two) times daily.   propranolol ER (INDERAL LA) 60 MG 24 hr capsule Take 1 capsule (60 mg total) by mouth daily.   RESTASIS 0.05 % ophthalmic emulsion Place 1 drop into both eyes 2 (two) times daily.   rosuvastatin (CRESTOR) 5 MG tablet Take 1 tablet (5 mg total) by mouth at bedtime.   SYMBICORT 160-4.5 MCG/ACT inhaler Inhale 2 puffs into the lungs 2 (two) times daily.   valACYclovir (VALTREX) 1000 MG tablet TAKE 1 TABLET (1,000 MG TOTAL) BY MOUTH DAILY.   Past Medical History:  Diagnosis Date   Arthritis    Asthma    Chicken pox    Depression    Diverticulitis    Genital warts    GERD (gastroesophageal reflux disease)    Hay fever    History of frequent urinary tract infections  Hypertension    IBS (irritable bowel syndrome)    Lactose intolerance    Urinary incontinence    Past Surgical History:  Procedure Laterality Date   ABDOMINAL HYSTERECTOMY     Hip Replacement  2020   PARTIAL COLECTOMY  1980s   TONSILLECTOMY AND ADENOIDECTOMY  childhood   vocal cord tumor removal  2012   Social History   Social History Narrative   She is widowed and reports having 1 son   Her nephew Tina Riley helps   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   Review of Systems As per HPI otherwise negative  Objective:   Physical Exam BP 120/70   Pulse (!) 59   Ht 5' (1.524 m)   Wt 101 lb (45.8 kg)   BMI 19.73 kg/m  Elder ww NAD, frail Lungs  cta Cor Nl Abd soft and NT Rectal Patti [place], CMA present.  NL anoderm DRE w/ NL resting tone and some decrease voluntary tone No stool, no mass and no rectocele Simulated defecation seems intact

## 2021-05-06 NOTE — Assessment & Plan Note (Signed)
This is still thinning and she will monitor. She has tried biotin.

## 2021-05-06 NOTE — Assessment & Plan Note (Signed)
We did spent a significant amount of time discussing options for this. She has tried metamucil with some improvement but is not doing this consistently. She is encouraged to do this consistently. She is still working to find dietary triggers. Is not wanting to use imodium due to fear of constipation and SBO. We discussed past surgery on her colon and possibly scar tissue contributes to symptoms.

## 2021-05-12 ENCOUNTER — Other Ambulatory Visit (HOSPITAL_COMMUNITY): Payer: Self-pay

## 2021-05-25 ENCOUNTER — Other Ambulatory Visit: Payer: Self-pay | Admitting: Internal Medicine

## 2021-05-26 ENCOUNTER — Other Ambulatory Visit (HOSPITAL_COMMUNITY): Payer: Self-pay

## 2021-05-26 MED ORDER — PROPRANOLOL HCL ER 60 MG PO CP24
60.0000 mg | ORAL_CAPSULE | Freq: Every day | ORAL | 1 refills | Status: DC
  Filled 2021-05-26: qty 90, 90d supply, fill #0
  Filled 2021-09-02: qty 90, 90d supply, fill #1

## 2021-06-01 ENCOUNTER — Telehealth: Payer: Self-pay | Admitting: Internal Medicine

## 2021-06-01 NOTE — Telephone Encounter (Signed)
Pt. Called and wants to know why she hasn't received albuterol for chronic asthma. Requesting it to be called in to [facility], or to have a call back to explain why it wasn't sent.     Callback #- [phone]

## 2021-06-02 ENCOUNTER — Other Ambulatory Visit: Payer: Self-pay

## 2021-06-02 ENCOUNTER — Other Ambulatory Visit (HOSPITAL_COMMUNITY): Payer: Self-pay

## 2021-06-02 DIAGNOSIS — J4540 Moderate persistent asthma, uncomplicated: Secondary | ICD-10-CM

## 2021-06-02 MED ORDER — ALBUTEROL SULFATE HFA 108 (90 BASE) MCG/ACT IN AERS
2.0000 | INHALATION_SPRAY | Freq: Four times a day (QID) | RESPIRATORY_TRACT | 2 refills | Status: DC | PRN
  Filled 2021-06-02: qty 8.5, 25d supply, fill #0

## 2021-06-05 ENCOUNTER — Other Ambulatory Visit (HOSPITAL_COMMUNITY): Payer: Self-pay

## 2021-06-08 ENCOUNTER — Other Ambulatory Visit (HOSPITAL_COMMUNITY): Payer: Self-pay

## 2021-06-08 ENCOUNTER — Other Ambulatory Visit: Payer: Self-pay | Admitting: Internal Medicine

## 2021-06-08 MED ORDER — GABAPENTIN 100 MG PO CAPS
100.0000 mg | ORAL_CAPSULE | Freq: Three times a day (TID) | ORAL | 0 refills | Status: DC
  Filled 2021-06-08: qty 270, 90d supply, fill #0

## 2021-06-15 ENCOUNTER — Other Ambulatory Visit (HOSPITAL_COMMUNITY): Payer: Self-pay

## 2021-06-16 ENCOUNTER — Other Ambulatory Visit (HOSPITAL_COMMUNITY): Payer: Self-pay

## 2021-06-18 ENCOUNTER — Encounter: Payer: Self-pay | Admitting: Internal Medicine

## 2021-06-19 NOTE — Telephone Encounter (Signed)
Message was delivered through pt  Nephew/ POA Georgann Housekeeper states that the pt has been having what she call's "colon attacks" which Marthann Schiller states is Abdomen discomfort/Urgency/water discharge/ along with Abdominal Cramping for 2 to three hours. Marthann Schiller states that the pt is averaging about 2 of these episodes a week. States that the pt was recommended to take the Benefiber but does not feel that it is helping.  Please advise

## 2021-07-08 ENCOUNTER — Ambulatory Visit: Payer: Medicare Other | Attending: Internal Medicine

## 2021-07-08 ENCOUNTER — Other Ambulatory Visit (HOSPITAL_BASED_OUTPATIENT_CLINIC_OR_DEPARTMENT_OTHER): Payer: Self-pay

## 2021-07-08 ENCOUNTER — Other Ambulatory Visit: Payer: Self-pay

## 2021-07-08 DIAGNOSIS — Z23 Encounter for immunization: Secondary | ICD-10-CM

## 2021-07-08 MED ORDER — PFIZER COVID-19 VAC BIVALENT 30 MCG/0.3ML IM SUSP
INTRAMUSCULAR | 0 refills | Status: DC
  Filled 2021-07-08: qty 0.3, 1d supply, fill #0

## 2021-07-08 NOTE — Progress Notes (Signed)
° °  Covid-19 Vaccination Clinic  Name:  Mone Commisso    MRN: [id number] DOB: 07-29-1931  07/08/2021  Ms. Lukes was observed post Covid-19 immunization for 15 minutes without incident. She was provided with Vaccine Information Sheet and instruction to access the V-Safe system.   Ms. Grussing was instructed to call 911 with any severe reactions post vaccine: Difficulty breathing  Swelling of face and throat  A fast heartbeat  A bad rash all over body  Dizziness and weakness   Immunizations Administered     Name Date Dose VIS Date Route   Pfizer Covid-19 Vaccine Bivalent Booster 07/08/2021  3:23 PM 0.3 mL 03/18/2021 Intramuscular   Manufacturer: [employer], [employer]   Lot: [id number]   NDC: [phone]

## 2021-07-20 ENCOUNTER — Other Ambulatory Visit (HOSPITAL_COMMUNITY): Payer: Self-pay

## 2021-07-27 ENCOUNTER — Telehealth: Payer: Self-pay | Admitting: Internal Medicine

## 2021-07-27 ENCOUNTER — Emergency Department (HOSPITAL_COMMUNITY): Payer: Medicare Other

## 2021-07-27 ENCOUNTER — Encounter (HOSPITAL_COMMUNITY): Payer: Self-pay

## 2021-07-27 ENCOUNTER — Other Ambulatory Visit: Payer: Self-pay

## 2021-07-27 ENCOUNTER — Emergency Department (HOSPITAL_COMMUNITY)
Admission: EM | Admit: 2021-07-27 | Discharge: 2021-07-28 | Disposition: A | Discharge status: Home / Self Care | Payer: Medicare Other | Attending: Emergency Medicine | Admitting: Emergency Medicine

## 2021-07-27 DIAGNOSIS — R197 Diarrhea, unspecified: Secondary | ICD-10-CM | POA: Diagnosis not present

## 2021-07-27 DIAGNOSIS — R109 Unspecified abdominal pain: Secondary | ICD-10-CM | POA: Diagnosis not present

## 2021-07-27 DIAGNOSIS — K5900 Constipation, unspecified: Secondary | ICD-10-CM | POA: Diagnosis not present

## 2021-07-27 DIAGNOSIS — I700 Atherosclerosis of aorta: Secondary | ICD-10-CM | POA: Diagnosis not present

## 2021-07-27 DIAGNOSIS — Z7982 Long term (current) use of aspirin: Secondary | ICD-10-CM | POA: Insufficient documentation

## 2021-07-27 DIAGNOSIS — K219 Gastro-esophageal reflux disease without esophagitis: Secondary | ICD-10-CM | POA: Diagnosis not present

## 2021-07-27 DIAGNOSIS — Z7951 Long term (current) use of inhaled steroids: Secondary | ICD-10-CM | POA: Insufficient documentation

## 2021-07-27 DIAGNOSIS — K5730 Diverticulosis of large intestine without perforation or abscess without bleeding: Secondary | ICD-10-CM | POA: Diagnosis not present

## 2021-07-27 DIAGNOSIS — J45909 Unspecified asthma, uncomplicated: Secondary | ICD-10-CM | POA: Insufficient documentation

## 2021-07-27 DIAGNOSIS — K769 Liver disease, unspecified: Secondary | ICD-10-CM | POA: Diagnosis not present

## 2021-07-27 DIAGNOSIS — N390 Urinary tract infection, site not specified: Secondary | ICD-10-CM

## 2021-07-27 DIAGNOSIS — Z20822 Contact with and (suspected) exposure to covid-19: Secondary | ICD-10-CM | POA: Diagnosis not present

## 2021-07-27 DIAGNOSIS — R1032 Left lower quadrant pain: Secondary | ICD-10-CM | POA: Diagnosis present

## 2021-07-27 DIAGNOSIS — K5732 Diverticulitis of large intestine without perforation or abscess without bleeding: Secondary | ICD-10-CM | POA: Diagnosis not present

## 2021-07-27 DIAGNOSIS — R9431 Abnormal electrocardiogram [ECG] [EKG]: Secondary | ICD-10-CM | POA: Diagnosis not present

## 2021-07-27 LAB — CBC WITH DIFFERENTIAL/PLATELET
Abs Immature Granulocytes: 0.01 10*3/uL (ref 0.00–0.07)
Basophils Absolute: 0 10*3/uL (ref 0.0–0.1)
Basophils Relative: 0 %
Eosinophils Absolute: 0.1 10*3/uL (ref 0.0–0.5)
Eosinophils Relative: 1 %
HCT: 41.8 % (ref 36.0–46.0)
Hemoglobin: 13.9 g/dL (ref 12.0–15.0)
Immature Granulocytes: 0 %
Lymphocytes Relative: 30 %
Lymphs Abs: 3 10*3/uL (ref 0.7–4.0)
MCH: 36.3 pg — ABNORMAL HIGH (ref 26.0–34.0)
MCHC: 33.3 g/dL (ref 30.0–36.0)
MCV: 109.1 fL — ABNORMAL HIGH (ref 80.0–100.0)
Monocytes Absolute: 0.3 10*3/uL (ref 0.1–1.0)
Monocytes Relative: 3 %
Neutro Abs: 6.6 10*3/uL (ref 1.7–7.7)
Neutrophils Relative %: 66 %
Platelets: 237 10*3/uL (ref 150–400)
RBC: 3.83 MIL/uL — ABNORMAL LOW (ref 3.87–5.11)
RDW: 12.7 % (ref 11.5–15.5)
WBC: 10 10*3/uL (ref 4.0–10.5)
nRBC: 0 % (ref 0.0–0.2)

## 2021-07-27 LAB — COMPREHENSIVE METABOLIC PANEL
ALT: 13 U/L (ref 0–44)
AST: 16 U/L (ref 15–41)
Albumin: 4.3 g/dL (ref 3.5–5.0)
Alkaline Phosphatase: 88 U/L (ref 38–126)
Anion gap: 9 (ref 5–15)
BUN: 20 mg/dL (ref 8–23)
CO2: 23 mmol/L (ref 22–32)
Calcium: 9.3 mg/dL (ref 8.9–10.3)
Chloride: 105 mmol/L (ref 98–111)
Creatinine, Ser: 0.92 mg/dL (ref 0.44–1.00)
GFR, Estimated: >60 mL/min (ref >60)
Glucose, Bld: 100 mg/dL — ABNORMAL HIGH (ref 70–99)
Potassium: 4.5 mmol/L (ref 3.5–5.1)
Sodium: 137 mmol/L (ref 135–145)
Total Bilirubin: 0.7 mg/dL (ref 0.3–1.2)
Total Protein: 7.1 g/dL (ref 6.5–8.1)

## 2021-07-27 LAB — LIPASE, BLOOD: Lipase: 38 U/L (ref 11–51)

## 2021-07-27 MED ORDER — IOHEXOL 350 MG/ML SOLN
100.0000 mL | Freq: Once | INTRAVENOUS | Status: AC | PRN
  Administered 2021-07-28: 70 mL via INTRAVENOUS

## 2021-07-27 MED ORDER — SODIUM CHLORIDE 0.9 % IV BOLUS
500.0000 mL | Freq: Once | INTRAVENOUS | Status: AC
  Administered 2021-07-28: 500 mL via INTRAVENOUS

## 2021-07-27 MED ORDER — ALUM & MAG HYDROXIDE-SIMETH 200-200-20 MG/5ML PO SUSP
30.0000 mL | Freq: Once | ORAL | Status: AC
  Administered 2021-07-27: 30 mL via ORAL
  Filled 2021-07-27: qty 30

## 2021-07-27 MED ORDER — LIDOCAINE VISCOUS HCL 2 % MT SOLN
15.0000 mL | Freq: Once | OROMUCOSAL | Status: AC
  Administered 2021-07-27: 15 mL via ORAL
  Filled 2021-07-27: qty 15

## 2021-07-27 MED ORDER — FAMOTIDINE IN NACL 20-0.9 MG/50ML-% IV SOLN
20.0000 mg | Freq: Once | INTRAVENOUS | Status: AC
  Administered 2021-07-28: 20 mg via INTRAVENOUS
  Filled 2021-07-27: qty 50

## 2021-07-27 NOTE — Telephone Encounter (Signed)
Caller connected to [facility] 1.9.2023.   Caller states she had diarrhea on Friday. had abdpain yesterday. diarrhea started again this am. Has upper abd pain. pain is constant. no vomiting. has had. nausea. drinking water. urinating.  Advised to go to Emergency Department now.

## 2021-07-27 NOTE — ED Triage Notes (Signed)
Patient c/o upper abdominal pain that radiates into the back and diarrhea x 3 days. Patient's nephew reports that the patient has had diarrhea since the fall which started out once a week and has been increasing in number of episodes to multiple times a day in the past few days.  Patient took Imodium x 1 this AM and has not had diarrhea since.

## 2021-07-27 NOTE — ED Provider Triage Note (Signed)
Emergency Medicine Provider Triage Evaluation Note  Tina Riley , a 88 y.o. female  was evaluated in triage.  Pt complains of progressively worsening abdominal pain since fall.  She reports episode daily since Friday which is unusual for her.  She also complains of diarrhea.  She denies fever, chills, vomiting.  She does endorse nausea.  She denies hematemesis, or blood in her stools.  She states she is afraid to eat because she feels that might trigger these episodes.  Review of Systems  Positive: As above  Negative: As above  Physical Exam  BP (!) 182/84 (BP Location: Left Arm)    Pulse 63    Temp 98.1 F (36.7 C) (Oral)    Resp 18    Ht 4\' 11"  (1.499 m)    Wt 45.8 kg    SpO2 99%    BMI 20.40 kg/m  Gen:   Awake, no distress   Resp:  Normal effort  MSK:   Moves extremities without difficulty  Other:    Medical Decision Making  Medically screening exam initiated at 5:20 PM.  Appropriate orders placed.  Rupard was informed that the remainder of the evaluation will be completed by another provider, this initial triage assessment does not replace that evaluation, and the importance of remaining in the ED until their evaluation is complete.     Elberta Spaniel, PA-C 07/27/21 1721

## 2021-07-27 NOTE — Telephone Encounter (Signed)
Pt is currently in the ED.

## 2021-07-27 NOTE — ED Provider Notes (Signed)
[facility]-EMERGENCY DEPT Provider Note   CSN: [id number] Arrival date & time: 07/27/21  1534     History  Chief Complaint  Patient presents with   Diarrhea   Abdominal Pain    Tina Riley is a 88 y.o. female.  This is a 88 y.o. female with significant medical history as below, including IBS, poor appetite who presents to the ED with complaint of diarrhea. Pt reports recurrent history of alternative constipation/diarrhea, fecal incontinence. This has been ongoing for >6 mos. She last saw GI Dr Leone PayorGessner recently and last colonoscopy was 4/19. She has been on various bowel regimens in the past with variable improvement. Has been taking her medications as prescribed.  He does report poor appetite over the past greater than 6 months.  She is able tolerate oral intake but over the past week has eaten less because she is worried that is going to trigger one of her "colon attacks."Denies melena or bright blood per rectum.  No rashes.  She has mild discomfort to her left lower quadrant described as a pressure/bloating sensation. She has some mild dysuria over the past week or so. No fevers, no emesis/nausea.  Symptoms improved with Bentyl and Tylenol.  No recent travel, recent diet changes or recent antibiotic use   The history is provided by the patient and a relative. No language interpreter was used.  Diarrhea Associated symptoms: abdominal pain   Associated symptoms: no fever, no headaches and no vomiting   Abdominal Pain Pain location:  LLQ Pain quality: aching and pressure   Pain radiates to:  Does not radiate Pain severity:  Mild Onset quality:  Gradual Timing:  Intermittent Progression:  Waxing and waning Chronicity:  Chronic Context: not alcohol use and not diet changes   Associated symptoms: constipation and diarrhea   Associated symptoms: no chest pain, no cough, no dysuria, no fever, no nausea, no shortness of breath and no vomiting      Patient  Active Problem List   Diagnosis Date Noted   Anxiety 02/24/2021   GERD (gastroesophageal reflux disease) 02/24/2021   Hair thinning 12/25/2020   HSV (herpes simplex virus) infection 10/17/2020   Allergic rhinitis 10/17/2020   Hyperlipidemia 10/17/2020   Diarrhea 10/17/2020   Asthma 10/17/2020   Insomnia 10/17/2020   Poor appetite 10/17/2020   Tremor 10/17/2020     Home Medications Prior to Admission medications   Medication Sig Start Date End Date Taking? Authorizing Provider  acetaminophen (TYLENOL) 500 MG tablet Take 500 mg by mouth daily as needed for headache or mild pain.   Yes [provider]  clonazePAM (KLONOPIN) 0.5 MG tablet Take 1 tablet (0.5 mg total) by mouth 2 (two) times daily as needed for anxiety 02/23/21 08/22/21 Yes Myrlene Brokerrawford, Elizabeth A, MD  dicyclomine (BENTYL) 10 MG capsule Take 1 capsule (10 mg total) by mouth 2 (two) times daily as needed. 01/16/21  Yes Myrlene Brokerrawford, Elizabeth A, MD  gabapentin (NEURONTIN) 100 MG capsule Take 1 capsule (100 mg total) by mouth 3 (three) times daily. Patient taking differently: Take 300 mg by mouth at bedtime. 06/08/21  Yes Myrlene Brokerrawford, Elizabeth A, MD  levocetirizine (XYZAL) 5 MG tablet Take 1 tablet (5 mg total) by mouth daily. Patient taking differently: Take 5 mg by mouth every evening. 11/25/20  Yes Myrlene Brokerrawford, Elizabeth A, MD  loperamide (IMODIUM) 2 MG capsule Take 2 mg by mouth as needed for diarrhea or loose stools (do not exceed 8 capsules a day).   Yes [provider]  nitrofurantoin, macrocrystal-monohydrate, (MACROBID) 100 MG capsule Take 1 capsule (100 mg total) by mouth 2 (two) times daily. 07/28/21  Yes Tanda Rockers A, DO  pantoprazole (PROTONIX) 40 MG tablet Take 1 tablet (40 mg total) by mouth 2 (two) times daily. Patient taking differently: Take 40 mg by mouth daily. 02/23/21  Yes Myrlene Broker, MD  propranolol ER (INDERAL LA) 60 MG 24 hr capsule Take 1 capsule (60 mg total) by mouth daily. 05/26/21  Yes  Myrlene Broker, MD  rosuvastatin (CRESTOR) 5 MG tablet Take 1 tablet (5 mg total) by mouth at bedtime. 11/25/20  Yes Myrlene Broker, MD  SYMBICORT 160-4.5 MCG/ACT inhaler Inhale 2 puffs into the lungs 2 (two) times daily. Patient taking differently: Inhale 2 puffs into the lungs daily. 11/03/20  Yes Myrlene Broker, MD  valACYclovir (VALTREX) 1000 MG tablet TAKE 1 TABLET (1,000 MG TOTAL) BY MOUTH DAILY. Patient taking differently: Take 1,000 mg by mouth at bedtime. 10/15/20 10/15/21 Yes Myrlene Broker, MD  albuterol (VENTOLIN HFA) 108 (90 Base) MCG/ACT inhaler Inhale 2 puffs into the lungs every 6 (six) hours as needed for wheezing or shortness of breath. Patient not taking: Reported on 07/28/2021 06/02/21   Myrlene Broker, MD  aspirin EC 81 MG tablet Take 81 mg by mouth daily. Swallow whole. Patient not taking: Reported on 07/28/2021    [provider]  COVID-19 mRNA bivalent vaccine, Pfizer, (PFIZER COVID-19 VAC BIVALENT) injection Inject into the muscle. 07/08/21   Judyann Munson, MD  RESTASIS 0.05 % ophthalmic emulsion Place 1 drop into both eyes 2 (two) times daily. Patient not taking: Reported on 07/28/2021 05/21/20   [provider]      Allergies    Cephalexin, Ciprofloxacin, Other, Meperidine hcl, Sulfa antibiotics, Cefdinir, and Doxycycline    Review of Systems   Review of Systems  Constitutional:  Positive for appetite change. Negative for activity change and fever.  HENT:  Negative for facial swelling and trouble swallowing.   Eyes:  Negative for discharge and redness.  Respiratory:  Negative for cough and shortness of breath.   Cardiovascular:  Negative for chest pain and palpitations.  Gastrointestinal:  Positive for abdominal pain, constipation and diarrhea. Negative for nausea and vomiting.  Genitourinary:  Negative for dysuria and flank pain.  Musculoskeletal:  Negative for back pain and gait problem.  Skin:  Negative for  pallor and rash.  Neurological:  Negative for syncope and headaches.   Physical Exam Updated Vital Signs BP (!) 199/72    Pulse 79    Temp 98.1 F (36.7 C) (Oral)    Resp 15    Ht 4\' 11"  (1.499 m)    Wt 45.8 kg    SpO2 97%    BMI 20.40 kg/m  Physical Exam Vitals and nursing note reviewed.  Constitutional:      General: She is not in acute distress.    Appearance: Normal appearance. She is well-developed.  HENT:     Head: Normocephalic and atraumatic.     Right Ear: External ear normal.     Left Ear: External ear normal.     Nose: Nose normal.     Mouth/Throat:     Mouth: Mucous membranes are moist.  Eyes:     General: No scleral icterus.       Right eye: No discharge.        Left eye: No discharge.  Cardiovascular:     Rate and Rhythm: Normal rate  and regular rhythm.     Pulses: Normal pulses.     Heart sounds: Normal heart sounds.  Pulmonary:     Effort: Pulmonary effort is normal. No respiratory distress.     Breath sounds: Normal breath sounds.  Abdominal:     General: Abdomen is flat. Bowel sounds are normal.     Palpations: Abdomen is soft.     Tenderness: There is no abdominal tenderness. There is no right CVA tenderness or left CVA tenderness.    Musculoskeletal:        General: Normal range of motion.     Cervical back: Normal range of motion.     Right lower leg: No edema.     Left lower leg: No edema.  Skin:    General: Skin is warm and dry.     Capillary Refill: Capillary refill takes less than 2 seconds.  Neurological:     Mental Status: She is alert and oriented to person, place, and time.     GCS: GCS eye subscore is 4. GCS verbal subscore is 5. GCS motor subscore is 6.  Psychiatric:        Mood and Affect: Mood normal.        Behavior: Behavior normal.    ED Results / Procedures / Treatments   Labs (all labs ordered are listed, but only abnormal results are displayed) Labs Reviewed  CBC WITH DIFFERENTIAL/PLATELET - Abnormal; Notable for the  following components:      Result Value   RBC 3.83 (*)    MCV 109.1 (*)    MCH 36.3 (*)    All other components within normal limits  COMPREHENSIVE METABOLIC PANEL - Abnormal; Notable for the following components:   Glucose, Bld 100 (*)    All other components within normal limits  URINALYSIS, ROUTINE W REFLEX MICROSCOPIC - Abnormal; Notable for the following components:   APPearance CLOUDY (*)    Hgb urine dipstick SMALL (*)    Ketones, ur 5 (*)    Nitrite POSITIVE (*)    Leukocytes,Ua LARGE (*)    WBC, UA >50 (*)    Bacteria, UA RARE (*)    All other components within normal limits  RESP PANEL BY RT-PCR (FLU A&B, COVID) [id number]  URINE CULTURE  LIPASE, BLOOD    EKG EKG Interpretation  Date/Time:  Monday July 27 2021 23:58:49 EST Ventricular Rate:  70 PR Interval:  145 QRS Duration: 86 QT Interval:  419 QTC Calculation: 453 R Axis:   -34 Text Interpretation: Sinus rhythm Abnormal R-wave progression, late transition Left ventricular hypertrophy no prior tracing available Confirmed by Tanda Rockers ([id number]) on 07/28/2021 1:30:25 AM  Radiology CT ABDOMEN PELVIS W CONTRAST  Result Date: 07/28/2021 CLINICAL DATA:  Abdominal pain. EXAM: CT ABDOMEN AND PELVIS WITH CONTRAST TECHNIQUE: Multidetector CT imaging of the abdomen and pelvis was performed using the standard protocol following bolus administration of intravenous contrast. CONTRAST:  70mL OMNIPAQUE IOHEXOL 350 MG/ML SOLN COMPARISON:  None. FINDINGS: Lower chest: The visualized lung bases are clear. There is eventration of the right hemidiaphragm. There is coronary vascular calcification. No intra-abdominal free air or free fluid. Hepatobiliary: Multiple hepatic cysts and smaller hypodense liver lesions which are not characterized on this CT. There is mild biliary ductal dilatation, likely post cholecystectomy. No retained calcified stone noted in the central CBD. Pancreas: Unremarkable. No pancreatic ductal dilatation or  surrounding inflammatory changes. Spleen: Normal in size without focal abnormality. Adrenals/Urinary Tract: The adrenal glands are unremarkable. There is  no hydronephrosis on either side. There is symmetric enhancement and excretion of contrast by both kidneys. Small right renal cysts. The visualized ureters and urinary bladder appear unremarkable. Stomach/Bowel: Severe sigmoid diverticulosis without active inflammatory changes. There is no bowel obstruction or active inflammation. There is a 3 cm duodenal diverticulum. The appendix is not visualized with certainty. No inflammatory changes identified in the right lower quadrant. Vascular/Lymphatic: Moderate aortoiliac atherosclerotic disease. The IVC is unremarkable. No portal venous gas. There is no adenopathy. Reproductive: Hysterectomy. Other: None Musculoskeletal: Osteopenia with degenerative changes of the spine. Total left hip arthroplasty. No acute osseous pathology. IMPRESSION: 1. No acute intra-abdominal or pelvic pathology. 2. Severe sigmoid diverticulosis. No bowel obstruction. 3. Aortic Atherosclerosis (ICD10-I70.0). Electronically Signed   By: Elgie CollardArash  Radparvar M.D.   On: 07/28/2021 01:55    Procedures Procedures   Cardiac monitoring reviewed by myself shows normal sinus rhythm   Medications Ordered in ED Medications  sodium chloride (PF) 0.9 % injection (has no administration in time range)  iohexol (OMNIPAQUE) 350 MG/ML injection 100 mL (70 mLs Intravenous Contrast Given 07/28/21 0134)  famotidine (PEPCID) IVPB 20 mg premix (0 mg Intravenous Stopped 07/28/21 0230)  alum & mag hydroxide-simeth (MAALOX/MYLANTA) 200-200-20 MG/5ML suspension 30 mL (30 mLs Oral Given 07/27/21 2356)    And  lidocaine (XYLOCAINE) 2 % viscous mouth solution 15 mL (15 mLs Oral Given 07/27/21 2356)  sodium chloride 0.9 % bolus 500 mL (0 mLs Intravenous Stopped 07/28/21 0233)    ED Course/ Medical Decision Making/ A&P                           Medical Decision  Making   CC: Abdominal pain, diarrhea, constipation  This patient complains of above; this involves an extensive number of treatment options and is a complaint that carries with it a high risk of complications and morbidity. Vital signs were reviewed. Serious etiologies considered.  Record review:   Previous records obtained and reviewed   Additional history obtained from family at bedside  Work up as above, notable for:  Labs & imaging results that were available during my care of the patient were reviewed by me and considered in my medical decision making.   Labs reviewed and are stable.  I ordered imaging studies which included CT abdomen pelvis and I independently visualized and interpreted imaging which showed sigmoid diverticulosis  Management: Patient given GI cocktail, IV fluids  Reassessment:  Pt reports her symptoms have resolved, she is able to tolerate PO. No ongoing abdominal pain or episodes of loose stools while in the ED. Her BP is mildly elevated but did not take night time BP meds this evening, advised her to take her night time medications when she gets home tonight.  Pt with chronic abd pain/constipation/diarrhea, has seen GI in the past regarding this. Symptoms today likely ongoing chronic conditions that she has been evaluated for previously. CT imaging reviewed and shows diverticulosis, otherwise no acute process. At this time recommend she f/u with her GI specialist for repeat evaluation, med management. She would like new GI Dr, given on call docs for f/u.  UA shows UTI, culture sent. Her CrCl is 31, will give macrobid as she has multiple drug allergies and low suspicion for pyelo.   The patient improved significantly and was discharged in stable condition. Detailed discussions were had with the patient regarding current findings, and need for close f/u with PCP or on call doctor. The patient  has been instructed to return immediately if the symptoms worsen in  any way for re-evaluation. Patient verbalized understanding and is in agreement with current care plan. All questions answered prior to discharge.       This chart was dictated using voice recognition software.  Despite best efforts to proofread,  errors can occur which can change the documentation meaning. Final Clinical Impression(s) / ED Diagnoses Final diagnoses:  Diarrhea, unspecified type  Lower urinary tract infectious disease  Sigmoid diverticulosis    Rx / DC Orders ED Discharge Orders          Ordered    nitrofurantoin, macrocrystal-monohydrate, (MACROBID) 100 MG capsule  2 times daily        07/28/21 0418              Sloan Leiter, DO 07/28/21 0425

## 2021-07-28 ENCOUNTER — Other Ambulatory Visit (HOSPITAL_COMMUNITY): Payer: Self-pay

## 2021-07-28 ENCOUNTER — Encounter (HOSPITAL_COMMUNITY): Payer: Self-pay

## 2021-07-28 ENCOUNTER — Emergency Department (HOSPITAL_COMMUNITY): Payer: Medicare Other

## 2021-07-28 DIAGNOSIS — R109 Unspecified abdominal pain: Secondary | ICD-10-CM | POA: Diagnosis not present

## 2021-07-28 DIAGNOSIS — K769 Liver disease, unspecified: Secondary | ICD-10-CM | POA: Diagnosis not present

## 2021-07-28 DIAGNOSIS — I700 Atherosclerosis of aorta: Secondary | ICD-10-CM | POA: Diagnosis not present

## 2021-07-28 LAB — RESP PANEL BY RT-PCR (FLU A&B, COVID) ARPGX2
Influenza A by PCR: NEGATIVE
Influenza B by PCR: NEGATIVE
SARS Coronavirus 2 by RT PCR: NEGATIVE

## 2021-07-28 LAB — URINALYSIS, ROUTINE W REFLEX MICROSCOPIC
Bilirubin Urine: NEGATIVE
Glucose, UA: NEGATIVE mg/dL
Ketones, ur: 5 mg/dL — AB
Nitrite: POSITIVE — AB
Protein, ur: NEGATIVE mg/dL
Specific Gravity, Urine: 1.019 (ref 1.005–1.030)
WBC, UA: >50 WBC/hpf — ABNORMAL HIGH (ref 0–5)
pH: 5 (ref 5.0–8.0)

## 2021-07-28 MED ORDER — NITROFURANTOIN MONOHYD MACRO 100 MG PO CAPS
100.0000 mg | ORAL_CAPSULE | Freq: Two times a day (BID) | ORAL | 0 refills | Status: DC
  Filled 2021-07-28: qty 10, 5d supply, fill #0

## 2021-07-28 MED ORDER — SODIUM CHLORIDE (PF) 0.9 % IJ SOLN
INTRAMUSCULAR | Status: AC
  Filled 2021-07-28: qty 50

## 2021-07-28 NOTE — Telephone Encounter (Signed)
I was contacted by pharmacist Dorethea Clan. Patients creatinine clearance too low for Macrobid. She was seen for diarrhea and is asymptomatic for Uti. No abx for now and we will wait for culture results.

## 2021-07-28 NOTE — ED Notes (Signed)
Pt. Placed on purewick.  

## 2021-07-28 NOTE — Progress Notes (Signed)
IV started by ED RN. 

## 2021-07-28 NOTE — Discharge Instructions (Addendum)
Please follow up with gastroenterology regarding chronic diarrhea/abdominal pain.   There are many causes of abdominal pain. Most pain is not serious and goes away, but some pain gets worse, changes, or will not go away. Please return to the emergency department or see your doctor right away if you (or your family member) experience any of the following:  1. Pain that gets worse or moves to just one spot.  2. Pain that gets worse if you cough or sneeze.  3. Pain with going over a bump in the road.  4. Pain that does not get better in 24 hours.  5. Inability to keep down liquids (vomiting)-especially if you are making less urine.  6. Fainting.  7. Blood in the vomit or stool.  8. High fever or shaking chills.  9. Swelling of the abdomen.  10. Any new or worsening problem.      Follow-up Instructions  See your primary care provider if not completely better in the next 2-3 days. Come to the ED if you are unable to see them in this time frame.    Additional Instructions  No alcohol.  No caffeine, aspirin, or cigarettes.   Please return to the emergency department immediately for any new or concerning symptoms, or if you get worse.

## 2021-07-29 ENCOUNTER — Other Ambulatory Visit (HOSPITAL_COMMUNITY): Payer: Self-pay

## 2021-07-29 LAB — URINE CULTURE: Culture: >=100000 — AB

## 2021-07-30 ENCOUNTER — Telehealth: Payer: Self-pay

## 2021-07-30 NOTE — Telephone Encounter (Signed)
Post ED Visit - Positive Culture Follow-up  Culture report reviewed by antimicrobial stewardship pharmacist: [facility] []  , Pharm.D. []  Enzo Bi, Pharm.D., BCPS AQ-ID []  , Pharm.D., BCPS []  Celedonio Miyamoto, .D., BCPS []  [place], .D., BCPS, AAHIVP []  Georgina Pillion, Pharm.D., BCPS, AAHIVP []  [address], PharmD, BCPS []  , PharmD, BCPS []  [place], PharmD, BCPS []  [address], PharmD []  , PharmD, BCPS []  Estella Husk, PharmD  Pharmacy Team [x]  Lysle Pearl, PharmD []  , PharmD []  Phillips Climes, PharmD []  , Rph []  Agapito Games) , PharmD []  Verlan Friends, PharmD []  , PharmD []  Mervyn Gay, PharmD []  , PharmD []  Vinnie Level, PharmD []  [facility], PharmD []  , PharmD []  Georgina Pillion, PharmD   Positive urine culture Treated with Nitrofurantoin Monohyd Macro, organism sensitive to the same and no further patient follow-up is required at this time.  07/30/2021, 10:22 AM

## 2021-08-01 ENCOUNTER — Encounter (HOSPITAL_COMMUNITY): Payer: Self-pay | Admitting: Pharmacist

## 2021-08-03 ENCOUNTER — Other Ambulatory Visit: Payer: Self-pay

## 2021-08-03 ENCOUNTER — Encounter: Payer: Self-pay | Admitting: Internal Medicine

## 2021-08-03 ENCOUNTER — Ambulatory Visit (INDEPENDENT_AMBULATORY_CARE_PROVIDER_SITE_OTHER): Payer: Medicare Other | Admitting: Internal Medicine

## 2021-08-03 ENCOUNTER — Other Ambulatory Visit (HOSPITAL_COMMUNITY): Payer: Self-pay

## 2021-08-03 VITALS — BP 118/82 | HR 60 | Resp 18 | Ht 59.0 in | Wt 101.8 lb

## 2021-08-03 DIAGNOSIS — K591 Functional diarrhea: Secondary | ICD-10-CM

## 2021-08-03 DIAGNOSIS — F419 Anxiety disorder, unspecified: Secondary | ICD-10-CM

## 2021-08-03 DIAGNOSIS — R630 Anorexia: Secondary | ICD-10-CM | POA: Diagnosis not present

## 2021-08-03 MED FILL — Valacyclovir HCl Tab 1 GM: ORAL | 90 days supply | Qty: 90 | Fill #2 | Status: CN

## 2021-08-03 NOTE — Patient Instructions (Addendum)
We will get you in with a female GI provider to talk to them about the gut.  Try a daily probiotic.   We will have the social worker get in touch with you.

## 2021-08-03 NOTE — Progress Notes (Signed)
° °  Subjective:   Patient ID: Tina Riley, female    DOB: 1931/11/16, 88 y.o.   MRN: [id number]  HPI The patient is an 88 YO female coming in for ER follow up and ongoing GI issues.   Review of Systems  Constitutional:  Positive for activity change and appetite change.  HENT: Negative.    Eyes: Negative.   Respiratory:  Negative for cough, chest tightness and shortness of breath.   Cardiovascular:  Negative for chest pain, palpitations and leg swelling.  Gastrointestinal:  Positive for abdominal distention, abdominal pain and diarrhea. Negative for constipation, nausea and vomiting.  Musculoskeletal: Negative.   Skin: Negative.   Neurological: Negative.   Psychiatric/Behavioral:  Positive for sleep disturbance. The patient is nervous/anxious.    Objective:  Physical Exam Constitutional:      Appearance: She is well-developed.  HENT:     Head: Normocephalic and atraumatic.  Cardiovascular:     Rate and Rhythm: Normal rate and regular rhythm.  Pulmonary:     Effort: Pulmonary effort is normal. No respiratory distress.     Breath sounds: Normal breath sounds. No wheezing or rales.  Abdominal:     General: Bowel sounds are normal. There is no distension.     Palpations: Abdomen is soft.     Tenderness: There is abdominal tenderness. There is no rebound.     Comments: Mild diffuse tenderness  Musculoskeletal:     Cervical back: Normal range of motion.  Skin:    General: Skin is warm and dry.  Neurological:     Mental Status: She is alert and oriented to person, place, and time.     Coordination: Coordination abnormal.    Vitals:   08/03/21 1458  BP: 118/82  Pulse: 60  Resp: 18  SpO2: 96%  Weight: 101 lb 12.8 oz (46.2 kg)  Height: 4\' 11"  (1.499 m)    This visit occurred during the SARS-CoV-2 public health emergency.  Safety protocols were in place, including screening questions prior to the visit, additional usage of staff PPE, and extensive cleaning of exam  room while observing appropriate contact time as indicated for disinfecting solutions.   Assessment & Plan:  Visit time 25 minutes in face to face communication with patient and coordination of care, additional 10 minutes spent in record review, coordination or care, ordering tests, communicating/referring to other healthcare professionals, documenting in medical records all on the same day of the visit for total time 35 minutes spent on the visit.

## 2021-08-04 ENCOUNTER — Telehealth: Payer: Self-pay | Admitting: *Deleted

## 2021-08-04 ENCOUNTER — Other Ambulatory Visit (HOSPITAL_COMMUNITY): Payer: Self-pay

## 2021-08-04 ENCOUNTER — Ambulatory Visit (INDEPENDENT_AMBULATORY_CARE_PROVIDER_SITE_OTHER): Payer: Medicare Other | Admitting: Licensed Clinical Social Worker

## 2021-08-04 DIAGNOSIS — J4540 Moderate persistent asthma, uncomplicated: Secondary | ICD-10-CM

## 2021-08-04 DIAGNOSIS — F419 Anxiety disorder, unspecified: Secondary | ICD-10-CM

## 2021-08-04 MED FILL — Valacyclovir HCl Tab 1 GM: ORAL | 90 days supply | Qty: 90 | Fill #2 | Status: AC

## 2021-08-04 NOTE — Patient Instructions (Signed)
Visit Information   Thank you for taking time to visit with me today. Please don't hesitate to contact me if I can be of assistance to you before our next scheduled telephone appointment.  Following are the goals we discussed today: Connecting you with community support  Our next appointment is by telephone on Feb 20th  at 2:00  Please call the care guide team at [phone] if you need to cancel or reschedule your appointment.   If you are experiencing a Mental Health or [place] or need someone to talk to, please call 1-800-273-TALK (toll free, 24 hour hotline) call 911   Following is a copy of your full care plan:  Care Plan : LCSW Plan of Care  Updates made by Maurine Cane, LCSW since 08/04/2021 12:00 AM     Problem: Mobility and Independence      Long-Range Goal: Mobility and Independence Optimized by addtional home support   Start Date: 08/04/2021  This Visit's Progress: On track  Priority: High  Note:   Current Barriers:  Level of Care Concerns:Inability to perform IADL's independently and Inability to perform ADL's independently  CSW Clinical Goal(s):  Patient  will work with resources discussed to address needs related to level of care and home needs  through collaboration with [place] representative, provider, and care team.   Interventions: 1:1 collaboration with primary care provider regarding development and update of comprehensive plan of care as evidenced by provider attestation and co-signature Inter-disciplinary care team collaboration (see longitudinal plan of care) Evaluation of current treatment plan related to  self management and patient's adherence to plan as established by provider Review resources, discussed options and provided patient information about  Department of Social Services ( for in-home aide program ) Referral for [employer] (for meals on wheels and Telecare) Services provided by [employer]    Level of Care  Concerns in a patient with Anxiety, GERD, and Asthma:  (Status: New goal.) Current level of care: home, alone and support system is nephew  Evaluation of patient safety in current living environment:  Solution-Focused Strategies employed:  Active listening / Reflection utilized  Emotional Support Provided Problem [place] strategies reviewed For all in-home services ( private pay; PCS, etc) Collaborated with DSS for in-Home Aide Service [phone] to put patient on Wait list Made referral to via [place] for [employer] on [employer] and Telecare program  Patient Self-Care Activities: We completed your referral today with DSS In-Home Aide program, you are on the wait list I have placed a referral [employer] for meals on wheel, this is also a long wait list they will contact you.       Consent to CCM Services: Ms. Beining was given information about Chronic Care Management services including:  CCM service includes personalized support from designated clinical staff supervised by her physician, including individualized plan of care and coordination with other care providers 24/7 contact phone numbers for assistance for urgent and routine care needs. Service will only be billed when office clinical staff spend 20 minutes or more in a month to coordinate care. Only one practitioner may furnish and bill the service in a calendar month. The patient may stop CCM services at any time (effective at the end of the month) by phone call to the office staff. The patient will be responsible for cost sharing (co-pay) of up to 20% of the service fee (after annual deductible is met).  Patient agreed to services and  verbal consent obtained.   Patient verbalizes understanding of instructions and care plan provided today and agrees to view in [place]. Active MyChart status confirmed with patient.    Casimer Lanius, LCSW Licensed Clinical Social Worker [facility]  [place]  [place]  [phone]

## 2021-08-04 NOTE — Chronic Care Management (AMB) (Signed)
Chronic Care Management   Note  08/04/2021 Name: Tina Riley MRN: [id number] DOB: 02-[name]-1932  Tina Riley is a 88 y.o. year old female who is a primary care patient of Hoyt Koch, MD. I reached out to [facility] by phone today in response to a referral sent by Tina Riley PCP.  Tina Riley was given information about Chronic Care Management services today including:  CCM service includes personalized support from designated clinical staff supervised by her physician, including individualized plan of care and coordination with other care providers 24/7 contact phone numbers for assistance for urgent and routine care needs. Service will only be billed when office clinical staff spend 20 minutes or more in a month to coordinate care. Only one practitioner may furnish and bill the service in a calendar month. The patient may stop CCM services at any time (effective at the end of the month) by phone call to the office staff. The patient is responsible for co-pay (up to 20% after annual deductible is met) if co-pay is required by the individual health plan.   Patient agreed to services and verbal consent obtained.   Follow up plan: Telephone appointment with care management team member scheduled for:08/04/21  [place]  Direct Dial: [phone]

## 2021-08-04 NOTE — Chronic Care Management (AMB) (Signed)
Chronic Care Management   Clinical Social Work Note  08/04/2021 Name: Tina Riley MRN: [id number] DOB: 04/29/1932  Tina Riley is a 88 y.o. year old female who is a primary care patient of Hoyt Koch, MD. The CCM team was consulted to assist the patient with chronic disease management and/or care coordination needs related to: Level of Care Concerns.   Engaged with patient by telephone for initial visit in response to provider referral for social work chronic care management and care coordination services.   Consent to Services:  The patient was given the following information about Chronic Care Management services today, agreed to services, and gave verbal consent: 1. CCM service includes personalized support from designated clinical staff supervised by the primary care provider, including individualized plan of care and coordination with other care providers 2. 24/7 contact phone numbers for assistance for urgent and routine care needs. 3. Service will only be billed when office clinical staff spend 20 minutes or more in a month to coordinate care. 4. Only one practitioner may furnish and bill the service in a calendar month. 5.The patient may stop CCM services at any time (effective at the end of the month) by phone call to the office staff. 6. The patient will be responsible for cost sharing (co-pay) of up to 20% of the service fee (after annual deductible is met). Patient agreed to services and consent obtained.  Patient agreed to services and consent obtained.   Summary: Assessed patient's previous and current treatment, coping skills, support system and barriers to care. She is experiencing difficulty with meeting some of her ADL's related to custodial needs, has limited support and declines AFL as an option.. Discussed various options and private pay is not an option for her .  See Care Plan below for interventions and patient self-care actives.  Recommendation:  Patient may benefit from, and is in agreement to go on the wait list for In-Home Aide program at [facility] and the meals on wheels program, she understand this is a lengthy process and may take several months. Referral placed today for both programs.  Follow up Plan: Patient would like continued follow-up from CCM LCSW.  per patient's request will follow up in 4 weeks.  Will call office if needed prior to next encounter.   Assessment: Review of patient past medical history, allergies, medications, and health status, including review of relevant consultants reports was performed today as part of a comprehensive evaluation and provision of chronic care management and care coordination services.     SDOH (Social Determinants of Health) assessments and interventions performed:  SDOH Interventions    Flowsheet Row Most Recent Value  SDOH Interventions   SDOH Interventions for the Following Domains Food Insecurity  Food Insecurity Interventions [id number] Referral  [referral for meals on wheels]  Social Connections Interventions [id number] Referral  [[facility] TeleCare program]        Advanced Directives Status: See [employer] application for related entries.  CCM Care Plan  Conditions to be addressed/monitored: Anxiety, GERD, and Asthma; Level of care concerns  Care Plan : LCSW Plan of Care  Updates made by Maurine Cane, LCSW since 08/04/2021 12:00 AM     Problem: Mobility and Independence      Long-Range Goal: Mobility and Independence Optimized by addtional home support   Start Date: 08/04/2021  This Visit's Progress: On track  Priority: High  Note:   Current Barriers:  Level of Care Concerns:Inability to perform IADL's independently and Inability  to perform ADL's independently  CSW Clinical Goal(s):  Patient  will work with resources discussed to address needs related to level of care and home needs  through collaboration with [facility] representative, provider, and care team.    Interventions: 1:1 collaboration with primary care provider regarding development and update of comprehensive plan of care as evidenced by provider attestation and co-signature Inter-disciplinary care team collaboration (see longitudinal plan of care) Evaluation of current treatment plan related to  self management and patient's adherence to plan as established by provider Review resources, discussed options and provided patient information about  [facility] Social [facility] ( for in-home aide program ) Referral for [employer] (for meals on wheels and Telecare) Services provided by [employer]    Level of Care Concerns in a patient with Anxiety, GERD, and Asthma:  (Status: New goal.) Current level of care: home, alone and support system is nephew  Evaluation of patient safety in current living environment:  Solution-Focused Strategies employed:  Active listening / Reflection utilized  Emotional Support Provided Problem [place] strategies reviewed For all in-home services ( private pay; PCS, etc) Collaborated with DSS for in-Home Aide Service [phone] to put patient on Wait list Made referral to via [place] for [employer] on [employer] and Telecare program  Patient Self-Care Activities: We completed your referral today with DSS In-Home Aide program, you are on the wait list I have placed a referral [facility] for meals on wheel, this is also a long wait list they will contact you.       Casimer Lanius, LCSW Licensed Clinical Social Worker [facility]  [place]  [phone]

## 2021-08-05 ENCOUNTER — Telehealth: Payer: Self-pay

## 2021-08-05 NOTE — Assessment & Plan Note (Addendum)
Weight is down 2-3 pounds from pervious and she is scared to eat different food options due to worry about colon problems. Referral to our social worker to see about resources for in home care, informed them that typically these are all private pay.

## 2021-08-05 NOTE — Assessment & Plan Note (Signed)
This is worse lately and can be triggered by stomach flare ups. She will continue clonazepam BID prn and does not desire alternative treatment at this time.

## 2021-08-05 NOTE — Assessment & Plan Note (Signed)
Referral back to GI she wishes female provider. Reviewed findings from ER with severe diverticulosis with patient and nephew. She is advised to continue to avoid dairy products, add probiotic daily. She did try fiber daily for some time and did not report benefit. Given information about fodmap to help lower gas production.

## 2021-08-10 ENCOUNTER — Emergency Department (HOSPITAL_BASED_OUTPATIENT_CLINIC_OR_DEPARTMENT_OTHER): Payer: Medicare Other

## 2021-08-10 ENCOUNTER — Encounter (HOSPITAL_BASED_OUTPATIENT_CLINIC_OR_DEPARTMENT_OTHER): Payer: Self-pay | Admitting: Emergency Medicine

## 2021-08-10 ENCOUNTER — Inpatient Hospital Stay (HOSPITAL_BASED_OUTPATIENT_CLINIC_OR_DEPARTMENT_OTHER)
Admission: EM | Admit: 2021-08-10 | Discharge: 2021-08-15 | DRG: 389 | Disposition: A | Discharge status: Home Health | Payer: Medicare Other | Attending: Internal Medicine | Admitting: Internal Medicine

## 2021-08-10 ENCOUNTER — Other Ambulatory Visit: Payer: Self-pay

## 2021-08-10 DIAGNOSIS — R4189 Other symptoms and signs involving cognitive functions and awareness: Secondary | ICD-10-CM | POA: Diagnosis present

## 2021-08-10 DIAGNOSIS — K219 Gastro-esophageal reflux disease without esophagitis: Secondary | ICD-10-CM | POA: Diagnosis present

## 2021-08-10 DIAGNOSIS — Z515 Encounter for palliative care: Secondary | ICD-10-CM

## 2021-08-10 DIAGNOSIS — Z4682 Encounter for fitting and adjustment of non-vascular catheter: Secondary | ICD-10-CM | POA: Diagnosis not present

## 2021-08-10 DIAGNOSIS — Z83438 Family history of other disorder of lipoprotein metabolism and other lipidemia: Secondary | ICD-10-CM

## 2021-08-10 DIAGNOSIS — E875 Hyperkalemia: Secondary | ICD-10-CM | POA: Diagnosis present

## 2021-08-10 DIAGNOSIS — F32A Depression, unspecified: Secondary | ICD-10-CM | POA: Diagnosis not present

## 2021-08-10 DIAGNOSIS — E871 Hypo-osmolality and hyponatremia: Secondary | ICD-10-CM

## 2021-08-10 DIAGNOSIS — K5652 Intestinal adhesions [bands] with complete obstruction: Secondary | ICD-10-CM | POA: Diagnosis not present

## 2021-08-10 DIAGNOSIS — J4540 Moderate persistent asthma, uncomplicated: Secondary | ICD-10-CM | POA: Diagnosis not present

## 2021-08-10 DIAGNOSIS — E876 Hypokalemia: Secondary | ICD-10-CM | POA: Diagnosis not present

## 2021-08-10 DIAGNOSIS — R1030 Lower abdominal pain, unspecified: Secondary | ICD-10-CM | POA: Diagnosis not present

## 2021-08-10 DIAGNOSIS — M1990 Unspecified osteoarthritis, unspecified site: Secondary | ICD-10-CM | POA: Diagnosis present

## 2021-08-10 DIAGNOSIS — Z4659 Encounter for fitting and adjustment of other gastrointestinal appliance and device: Secondary | ICD-10-CM

## 2021-08-10 DIAGNOSIS — I10 Essential (primary) hypertension: Secondary | ICD-10-CM | POA: Diagnosis not present

## 2021-08-10 DIAGNOSIS — Z7189 Other specified counseling: Secondary | ICD-10-CM | POA: Diagnosis not present

## 2021-08-10 DIAGNOSIS — Z7982 Long term (current) use of aspirin: Secondary | ICD-10-CM

## 2021-08-10 DIAGNOSIS — Z96642 Presence of left artificial hip joint: Secondary | ICD-10-CM | POA: Diagnosis not present

## 2021-08-10 DIAGNOSIS — Z87440 Personal history of urinary (tract) infections: Secondary | ICD-10-CM

## 2021-08-10 DIAGNOSIS — K56609 Unspecified intestinal obstruction, unspecified as to partial versus complete obstruction: Secondary | ICD-10-CM | POA: Diagnosis not present

## 2021-08-10 DIAGNOSIS — E782 Mixed hyperlipidemia: Secondary | ICD-10-CM | POA: Diagnosis not present

## 2021-08-10 DIAGNOSIS — F09 Unspecified mental disorder due to known physiological condition: Secondary | ICD-10-CM

## 2021-08-10 DIAGNOSIS — Z79899 Other long term (current) drug therapy: Secondary | ICD-10-CM

## 2021-08-10 DIAGNOSIS — Z833 Family history of diabetes mellitus: Secondary | ICD-10-CM

## 2021-08-10 DIAGNOSIS — K589 Irritable bowel syndrome without diarrhea: Secondary | ICD-10-CM | POA: Diagnosis not present

## 2021-08-10 DIAGNOSIS — Z20822 Contact with and (suspected) exposure to covid-19: Secondary | ICD-10-CM | POA: Diagnosis not present

## 2021-08-10 DIAGNOSIS — R1110 Vomiting, unspecified: Secondary | ICD-10-CM | POA: Diagnosis not present

## 2021-08-10 DIAGNOSIS — J45909 Unspecified asthma, uncomplicated: Secondary | ICD-10-CM | POA: Diagnosis present

## 2021-08-10 DIAGNOSIS — E785 Hyperlipidemia, unspecified: Secondary | ICD-10-CM | POA: Diagnosis not present

## 2021-08-10 DIAGNOSIS — Z8249 Family history of ischemic heart disease and other diseases of the circulatory system: Secondary | ICD-10-CM

## 2021-08-10 DIAGNOSIS — Z8261 Family history of arthritis: Secondary | ICD-10-CM

## 2021-08-10 DIAGNOSIS — R109 Unspecified abdominal pain: Secondary | ICD-10-CM | POA: Diagnosis not present

## 2021-08-10 DIAGNOSIS — I700 Atherosclerosis of aorta: Secondary | ICD-10-CM | POA: Diagnosis not present

## 2021-08-10 DIAGNOSIS — Z881 Allergy status to other antibiotic agents status: Secondary | ICD-10-CM

## 2021-08-10 DIAGNOSIS — F4321 Adjustment disorder with depressed mood: Secondary | ICD-10-CM | POA: Diagnosis not present

## 2021-08-10 DIAGNOSIS — F419 Anxiety disorder, unspecified: Secondary | ICD-10-CM | POA: Diagnosis not present

## 2021-08-10 DIAGNOSIS — G629 Polyneuropathy, unspecified: Secondary | ICD-10-CM | POA: Diagnosis present

## 2021-08-10 DIAGNOSIS — Z823 Family history of stroke: Secondary | ICD-10-CM

## 2021-08-10 DIAGNOSIS — I959 Hypotension, unspecified: Secondary | ICD-10-CM | POA: Diagnosis not present

## 2021-08-10 DIAGNOSIS — R8290 Unspecified abnormal findings in urine: Secondary | ICD-10-CM | POA: Diagnosis not present

## 2021-08-10 DIAGNOSIS — R1084 Generalized abdominal pain: Secondary | ICD-10-CM | POA: Diagnosis not present

## 2021-08-10 DIAGNOSIS — Z634 Disappearance and death of family member: Secondary | ICD-10-CM

## 2021-08-10 DIAGNOSIS — Z888 Allergy status to other drugs, medicaments and biological substances status: Secondary | ICD-10-CM

## 2021-08-10 DIAGNOSIS — Z825 Family history of asthma and other chronic lower respiratory diseases: Secondary | ICD-10-CM

## 2021-08-10 DIAGNOSIS — Z9049 Acquired absence of other specified parts of digestive tract: Secondary | ICD-10-CM

## 2021-08-10 DIAGNOSIS — Z7951 Long term (current) use of inhaled steroids: Secondary | ICD-10-CM

## 2021-08-10 DIAGNOSIS — Z882 Allergy status to sulfonamides status: Secondary | ICD-10-CM

## 2021-08-10 DIAGNOSIS — E739 Lactose intolerance, unspecified: Secondary | ICD-10-CM | POA: Diagnosis present

## 2021-08-10 DIAGNOSIS — Z0189 Encounter for other specified special examinations: Secondary | ICD-10-CM

## 2021-08-10 DIAGNOSIS — Z90710 Acquired absence of both cervix and uterus: Secondary | ICD-10-CM

## 2021-08-10 DIAGNOSIS — Z66 Do not resuscitate: Secondary | ICD-10-CM | POA: Diagnosis present

## 2021-08-10 DIAGNOSIS — R112 Nausea with vomiting, unspecified: Secondary | ICD-10-CM | POA: Diagnosis not present

## 2021-08-10 DIAGNOSIS — R110 Nausea: Secondary | ICD-10-CM | POA: Diagnosis not present

## 2021-08-10 HISTORY — DX: Polyneuropathy, unspecified: G62.9

## 2021-08-10 LAB — COMPREHENSIVE METABOLIC PANEL
ALT: 7 U/L (ref 0–44)
AST: 23 U/L (ref 15–41)
Albumin: 4.5 g/dL (ref 3.5–5.0)
Alkaline Phosphatase: 70 U/L (ref 38–126)
Anion gap: 15 (ref 5–15)
BUN: 36 mg/dL — ABNORMAL HIGH (ref 8–23)
CO2: 23 mmol/L (ref 22–32)
Calcium: 9.6 mg/dL (ref 8.9–10.3)
Chloride: 97 mmol/L — ABNORMAL LOW (ref 98–111)
Creatinine, Ser: 1.11 mg/dL — ABNORMAL HIGH (ref 0.44–1.00)
GFR, Estimated: 48 mL/min — ABNORMAL LOW (ref >60)
Glucose, Bld: 111 mg/dL — ABNORMAL HIGH (ref 70–99)
Potassium: 5.5 mmol/L — ABNORMAL HIGH (ref 3.5–5.1)
Sodium: 135 mmol/L (ref 135–145)
Total Bilirubin: 0.8 mg/dL (ref 0.3–1.2)
Total Protein: 7.7 g/dL (ref 6.5–8.1)

## 2021-08-10 LAB — CBC
HCT: 44.5 % (ref 36.0–46.0)
Hemoglobin: 14.9 g/dL (ref 12.0–15.0)
MCH: 36.1 pg — ABNORMAL HIGH (ref 26.0–34.0)
MCHC: 33.5 g/dL (ref 30.0–36.0)
MCV: 107.7 fL — ABNORMAL HIGH (ref 80.0–100.0)
Platelets: 278 10*3/uL (ref 150–400)
RBC: 4.13 MIL/uL (ref 3.87–5.11)
RDW: 12.9 % (ref 11.5–15.5)
WBC: 8.3 10*3/uL (ref 4.0–10.5)
nRBC: 0 % (ref 0.0–0.2)

## 2021-08-10 LAB — RESP PANEL BY RT-PCR (FLU A&B, COVID) ARPGX2
Influenza A by PCR: NEGATIVE
Influenza B by PCR: NEGATIVE
SARS Coronavirus 2 by RT PCR: NEGATIVE

## 2021-08-10 LAB — LIPASE, BLOOD: Lipase: 12 U/L (ref 11–51)

## 2021-08-10 MED ORDER — IOHEXOL 300 MG/ML  SOLN
60.0000 mL | Freq: Once | INTRAMUSCULAR | Status: AC | PRN
  Administered 2021-08-10: 60 mL via INTRAVENOUS

## 2021-08-10 MED ORDER — ONDANSETRON HCL 4 MG/2ML IJ SOLN
4.0000 mg | Freq: Once | INTRAMUSCULAR | Status: AC | PRN
  Administered 2021-08-10: 4 mg via INTRAVENOUS
  Filled 2021-08-10: qty 2

## 2021-08-10 MED ORDER — MORPHINE SULFATE (PF) 4 MG/ML IV SOLN
4.0000 mg | Freq: Once | INTRAVENOUS | Status: AC
  Administered 2021-08-10: 4 mg via INTRAVENOUS
  Filled 2021-08-10: qty 1

## 2021-08-10 MED ORDER — LACTATED RINGERS IV SOLN: INTRAVENOUS | Status: DC

## 2021-08-10 MED ORDER — LACTATED RINGERS IV BOLUS
1000.0000 mL | Freq: Once | INTRAVENOUS | Status: AC
  Administered 2021-08-10: 1000 mL via INTRAVENOUS

## 2021-08-10 NOTE — ED Notes (Signed)
Multiple unsuccessful NG insertion attempts. NG tube continuing to coil in mouth with each attempt. MD notified.

## 2021-08-10 NOTE — ED Provider Notes (Signed)
[facility] EMERGENCY DEPT Provider Note   CSN: [id number] Arrival date & time: 08/10/21  1652     History  Chief Complaint  Patient presents with   Abdominal Pain   Nausea   Emesis    Tina Riley is a 88 y.o. female.  88 year old female presents with abdominal pain and emesis x1 day.  Emesis described as being bilious.  History of bowel obstruction in the past few years ago and this feels similar.  Is passing some flatus.  No fever.  Has had crampy abdominal discomfort associated with this.  No treatment use prior to arrival      Home Medications Prior to Admission medications   Medication Sig Start Date End Date Taking? Authorizing Provider  acetaminophen (TYLENOL) 500 MG tablet Take 500 mg by mouth daily as needed for headache or mild pain.   Yes [provider]  albuterol (VENTOLIN HFA) 108 (90 Base) MCG/ACT inhaler Inhale 2 puffs into the lungs every 6 (six) hours as needed for wheezing or shortness of breath. 06/02/21  Yes Hoyt Koch, MD  clonazePAM Bobbye [place]) 0.5 MG tablet Take 1 tablet (0.5 mg total) by mouth 2 (two) times daily as needed for anxiety 02/23/21 08/22/21 Yes Hoyt Koch, MD  dicyclomine (BENTYL) 10 MG capsule Take 1 capsule (10 mg total) by mouth 2 (two) times daily as needed. 01/16/21  Yes Hoyt Koch, MD  gabapentin (NEURONTIN) 100 MG capsule Take 1 capsule (100 mg total) by mouth 3 (three) times daily. Patient taking differently: Take 300 mg by mouth at bedtime. 06/08/21  Yes Hoyt Koch, MD  levocetirizine (XYZAL) 5 MG tablet Take 1 tablet (5 mg total) by mouth daily. Patient taking differently: Take 5 mg by mouth every evening. 11/25/20  Yes Hoyt Koch, MD  loperamide (IMODIUM) 2 MG capsule Take 2 mg by mouth as needed for diarrhea or loose stools (do not exceed 8 capsules a day).   Yes [provider]  pantoprazole (PROTONIX) 40 MG tablet Take 1 tablet (40 mg total) by  mouth 2 (two) times daily. Patient taking differently: Take 40 mg by mouth daily. 02/23/21  Yes Hoyt Koch, MD  propranolol ER (INDERAL LA) 60 MG 24 hr capsule Take 1 capsule (60 mg total) by mouth daily. 05/26/21  Yes Hoyt Koch, MD  rosuvastatin (CRESTOR) 5 MG tablet Take 1 tablet (5 mg total) by mouth at bedtime. 11/25/20  Yes Hoyt Koch, MD  SYMBICORT 160-4.5 MCG/ACT inhaler Inhale 2 puffs into the lungs 2 (two) times daily. Patient taking differently: Inhale 2 puffs into the lungs daily. 11/03/20  Yes Hoyt Koch, MD  valACYclovir (VALTREX) 1000 MG tablet TAKE 1 TABLET (1,000 MG TOTAL) BY MOUTH DAILY. Patient taking differently: Take 1,000 mg by mouth at bedtime. 10/15/20 11/02/21 Yes Hoyt Koch, MD  aspirin EC 81 MG tablet Take 81 mg by mouth daily. Swallow whole. Patient not taking: Reported on 07/28/2021    [provider]  RESTASIS 0.05 % ophthalmic emulsion Place 1 drop into both eyes 2 (two) times daily. Patient not taking: Reported on 07/28/2021 05/21/20   [provider]      Allergies    Cephalexin, Ciprofloxacin, Other, Meperidine hcl, Sulfa antibiotics, Cefdinir, and Doxycycline    Review of Systems   Review of Systems  All other systems reviewed and are negative.  Physical Exam Updated Vital Signs BP 127/60 (BP Location: Left Arm)    Pulse 69  Temp (!) 97.3 F (36.3 C) (Temporal)    Resp 18    Ht 1.499 m (4\' 11" )    Wt 45.4 kg    SpO2 100%    BMI 20.20 kg/m  Physical Exam Vitals and nursing note reviewed.  Constitutional:      General: She is not in acute distress.    Appearance: Normal appearance. She is well-developed. She is not toxic-appearing.  HENT:     Head: Normocephalic and atraumatic.  Eyes:     General: Lids are normal.     Conjunctiva/sclera: Conjunctivae normal.     Pupils: Pupils are equal, round, and reactive to light.  Neck:     Thyroid: No thyroid mass.     Trachea: No tracheal  deviation.  Cardiovascular:     Rate and Rhythm: Normal rate and regular rhythm.     Heart sounds: Normal heart sounds. No murmur heard.   No gallop.  Pulmonary:     Effort: Pulmonary effort is normal. No respiratory distress.     Breath sounds: Normal breath sounds. No stridor. No decreased breath sounds, wheezing, rhonchi or rales.  Abdominal:     General: There is distension.     Palpations: Abdomen is soft.     Tenderness: There is generalized abdominal tenderness. There is no guarding or rebound.  Musculoskeletal:        General: No tenderness. Normal range of motion.     Cervical back: Normal range of motion and neck supple.  Skin:    General: Skin is warm and dry.     Findings: No abrasion or rash.  Neurological:     Mental Status: She is alert and oriented to person, place, and time. Mental status is at baseline.     GCS: GCS eye subscore is 4. GCS verbal subscore is 5. GCS motor subscore is 6.     Cranial Nerves: No cranial nerve deficit.     Sensory: No sensory deficit.     Motor: Motor function is intact.  Psychiatric:        Attention and Perception: Attention normal.        Speech: Speech normal.        Behavior: Behavior normal.    ED Results / Procedures / Treatments   Labs (all labs ordered are listed, but only abnormal results are displayed) Labs Reviewed  CBC - Abnormal; Notable for the following components:      Result Value   MCV 107.7 (*)    MCH 36.1 (*)    All other components within normal limits  LIPASE, BLOOD  COMPREHENSIVE METABOLIC PANEL  URINALYSIS, ROUTINE W REFLEX MICROSCOPIC    EKG None  Radiology No results found.  Procedures Procedures    Medications Ordered in ED Medications  ondansetron (ZOFRAN) injection 4 mg (has no administration in time range)  lactated ringers infusion (has no administration in time range)  lactated ringers bolus 1,000 mL (has no administration in time range)  morphine 4 MG/ML injection 4 mg (has no  administration in time range)    ED Course/ Medical Decision Making/ A&P                           Medical Decision Making Amount and/or Complexity of Data Reviewed Labs: ordered. Radiology: ordered.  Risk Prescription drug management.  Old records reviewed Abdominal CT consistent with high-grade small bowel obstruction.  Patient given IV fluids along with pain medications here.  We will have nursing place NG tube and will consult general surgery for admission  8:30 PM Discussed with general surgeon on-call who requested patient be admitted to the hospitalist family informed        Final Clinical Impression(s) / ED Diagnoses Final diagnoses:  None    Rx / DC Orders ED Discharge Orders     None         Lacretia Leigh, MD 08/10/21 2030

## 2021-08-10 NOTE — Plan of Care (Signed)
[facility] will assume care on arrival to accepting facility. Until arrival, care as per EDP. However, [facility] available 24/7 for questions and assistance.  Nursing staff, please page [facility] Admits and Consults ([phone]) as soon as the patient arrives to the hospital.   

## 2021-08-10 NOTE — Telephone Encounter (Signed)
Pt calling in stating that she is throwing up Greenish colored stuff. Pt states that she had a death in the family(Sister) and she believes its just making her very upset.   Pt was seen in office on 1/16 for Diarrhea.  CMA  advised me to ask her to be checked out by the ER maybe in need of fluids. Pt states that she doesn't have a way due to the death. I suggested that maybe she contact EMS for transportation. Pt was going to reach out to her nephew.  FYI

## 2021-08-10 NOTE — ED Triage Notes (Signed)
Pt BIB [facility] from home w/abd pain, N/V and generalized weakness x2 days. Pt reports same symptoms 2 weeks ago, went away and returned 2 days ago. Per EMS pt reported having to have surgery and only has 2 feet of colon from said surgery.   BP 140/70 HR 69 100% RA  CBG 152

## 2021-08-10 NOTE — ED Triage Notes (Signed)
Pt via ems from home with abdominal pain, n/v, weakness x 2 days. Had similar symptoms 2 weeks ago but they went away. Pt alert & oriented, nad noted.

## 2021-08-10 NOTE — ED Notes (Signed)
Niece and Nephew requesting to be called when pt receives a bed.

## 2021-08-11 ENCOUNTER — Inpatient Hospital Stay (HOSPITAL_COMMUNITY): Payer: Medicare Other

## 2021-08-11 ENCOUNTER — Other Ambulatory Visit (HOSPITAL_COMMUNITY): Payer: Self-pay

## 2021-08-11 ENCOUNTER — Encounter (HOSPITAL_COMMUNITY): Payer: Self-pay | Admitting: Internal Medicine

## 2021-08-11 DIAGNOSIS — E875 Hyperkalemia: Secondary | ICD-10-CM | POA: Diagnosis not present

## 2021-08-11 DIAGNOSIS — E739 Lactose intolerance, unspecified: Secondary | ICD-10-CM | POA: Diagnosis present

## 2021-08-11 DIAGNOSIS — K219 Gastro-esophageal reflux disease without esophagitis: Secondary | ICD-10-CM | POA: Diagnosis not present

## 2021-08-11 DIAGNOSIS — K5652 Intestinal adhesions [bands] with complete obstruction: Secondary | ICD-10-CM | POA: Diagnosis present

## 2021-08-11 DIAGNOSIS — K5669 Other partial intestinal obstruction: Secondary | ICD-10-CM | POA: Diagnosis not present

## 2021-08-11 DIAGNOSIS — J4540 Moderate persistent asthma, uncomplicated: Secondary | ICD-10-CM | POA: Diagnosis not present

## 2021-08-11 DIAGNOSIS — K589 Irritable bowel syndrome without diarrhea: Secondary | ICD-10-CM | POA: Diagnosis present

## 2021-08-11 DIAGNOSIS — K56609 Unspecified intestinal obstruction, unspecified as to partial versus complete obstruction: Secondary | ICD-10-CM

## 2021-08-11 DIAGNOSIS — Z4682 Encounter for fitting and adjustment of non-vascular catheter: Secondary | ICD-10-CM | POA: Diagnosis not present

## 2021-08-11 DIAGNOSIS — R8290 Unspecified abnormal findings in urine: Secondary | ICD-10-CM | POA: Diagnosis present

## 2021-08-11 DIAGNOSIS — E782 Mixed hyperlipidemia: Secondary | ICD-10-CM | POA: Diagnosis not present

## 2021-08-11 DIAGNOSIS — F32A Depression, unspecified: Secondary | ICD-10-CM | POA: Diagnosis present

## 2021-08-11 DIAGNOSIS — K6389 Other specified diseases of intestine: Secondary | ICD-10-CM | POA: Diagnosis not present

## 2021-08-11 DIAGNOSIS — Z90710 Acquired absence of both cervix and uterus: Secondary | ICD-10-CM | POA: Diagnosis not present

## 2021-08-11 DIAGNOSIS — F419 Anxiety disorder, unspecified: Secondary | ICD-10-CM | POA: Diagnosis present

## 2021-08-11 DIAGNOSIS — E785 Hyperlipidemia, unspecified: Secondary | ICD-10-CM | POA: Diagnosis present

## 2021-08-11 DIAGNOSIS — Z515 Encounter for palliative care: Secondary | ICD-10-CM | POA: Diagnosis not present

## 2021-08-11 DIAGNOSIS — R4189 Other symptoms and signs involving cognitive functions and awareness: Secondary | ICD-10-CM | POA: Diagnosis present

## 2021-08-11 DIAGNOSIS — J45909 Unspecified asthma, uncomplicated: Secondary | ICD-10-CM | POA: Diagnosis present

## 2021-08-11 DIAGNOSIS — Z7189 Other specified counseling: Secondary | ICD-10-CM | POA: Diagnosis not present

## 2021-08-11 DIAGNOSIS — I10 Essential (primary) hypertension: Secondary | ICD-10-CM | POA: Diagnosis present

## 2021-08-11 DIAGNOSIS — E876 Hypokalemia: Secondary | ICD-10-CM | POA: Diagnosis present

## 2021-08-11 DIAGNOSIS — K5939 Other megacolon: Secondary | ICD-10-CM | POA: Diagnosis not present

## 2021-08-11 DIAGNOSIS — Z66 Do not resuscitate: Secondary | ICD-10-CM | POA: Diagnosis not present

## 2021-08-11 DIAGNOSIS — Z20822 Contact with and (suspected) exposure to covid-19: Secondary | ICD-10-CM | POA: Diagnosis present

## 2021-08-11 DIAGNOSIS — Z9049 Acquired absence of other specified parts of digestive tract: Secondary | ICD-10-CM | POA: Diagnosis not present

## 2021-08-11 DIAGNOSIS — E871 Hypo-osmolality and hyponatremia: Secondary | ICD-10-CM | POA: Diagnosis not present

## 2021-08-11 DIAGNOSIS — Z87440 Personal history of urinary (tract) infections: Secondary | ICD-10-CM | POA: Diagnosis not present

## 2021-08-11 DIAGNOSIS — M1990 Unspecified osteoarthritis, unspecified site: Secondary | ICD-10-CM | POA: Diagnosis present

## 2021-08-11 DIAGNOSIS — Z96642 Presence of left artificial hip joint: Secondary | ICD-10-CM | POA: Diagnosis not present

## 2021-08-11 DIAGNOSIS — G629 Polyneuropathy, unspecified: Secondary | ICD-10-CM | POA: Diagnosis present

## 2021-08-11 LAB — URINALYSIS, ROUTINE W REFLEX MICROSCOPIC
Bilirubin Urine: NEGATIVE
Glucose, UA: NEGATIVE mg/dL
Ketones, ur: 40 mg/dL — AB
Nitrite: POSITIVE — AB
Protein, ur: 30 mg/dL — AB
Specific Gravity, Urine: >1.046 — ABNORMAL HIGH (ref 1.005–1.030)
WBC, UA: >50 WBC/hpf — ABNORMAL HIGH (ref 0–5)
pH: 6 (ref 5.0–8.0)

## 2021-08-11 LAB — BASIC METABOLIC PANEL
Anion gap: 24 — ABNORMAL HIGH (ref 5–15)
BUN: 23 mg/dL (ref 8–23)
CO2: 19 mmol/L — ABNORMAL LOW (ref 22–32)
Calcium: 9.3 mg/dL (ref 8.9–10.3)
Chloride: 100 mmol/L (ref 98–111)
Creatinine, Ser: 1.01 mg/dL — ABNORMAL HIGH (ref 0.44–1.00)
GFR, Estimated: 54 mL/min — ABNORMAL LOW (ref >60)
Glucose, Bld: 74 mg/dL (ref 70–99)
Potassium: 3.8 mmol/L (ref 3.5–5.1)
Sodium: 143 mmol/L (ref 135–145)

## 2021-08-11 LAB — MAGNESIUM: Magnesium: 1.7 mg/dL (ref 1.7–2.4)

## 2021-08-11 MED ORDER — MORPHINE SULFATE (PF) 2 MG/ML IV SOLN: 1.0000 mg | INTRAVENOUS | Status: DC | PRN

## 2021-08-11 MED ORDER — PANTOPRAZOLE SODIUM 40 MG IV SOLR
40.0000 mg | INTRAVENOUS | Status: DC
  Administered 2021-08-11 – 2021-08-13 (×3): 40 mg via INTRAVENOUS
  Filled 2021-08-11 (×3): qty 40

## 2021-08-11 MED ORDER — FLUTICASONE FUROATE-VILANTEROL 200-25 MCG/ACT IN AEPB
1.0000 | INHALATION_SPRAY | Freq: Every day | RESPIRATORY_TRACT | Status: DC
  Filled 2021-08-11: qty 28

## 2021-08-11 MED ORDER — ONDANSETRON HCL 4 MG/2ML IJ SOLN
4.0000 mg | Freq: Four times a day (QID) | INTRAMUSCULAR | Status: DC | PRN
  Administered 2021-08-12 (×2): 4 mg via INTRAVENOUS
  Filled 2021-08-11 (×2): qty 2

## 2021-08-11 MED ORDER — ONDANSETRON HCL 4 MG/2ML IJ SOLN
4.0000 mg | Freq: Once | INTRAMUSCULAR | Status: AC
  Administered 2021-08-11: 13:00:00 4 mg via INTRAVENOUS
  Filled 2021-08-11: qty 2

## 2021-08-11 MED ORDER — MORPHINE SULFATE (PF) 4 MG/ML IV SOLN
4.0000 mg | Freq: Once | INTRAVENOUS | Status: AC
  Administered 2021-08-11: 13:00:00 4 mg via INTRAVENOUS
  Filled 2021-08-11: qty 1

## 2021-08-11 MED ORDER — ONDANSETRON HCL 4 MG PO TABS: 4.0000 mg | ORAL_TABLET | Freq: Four times a day (QID) | ORAL | Status: DC | PRN

## 2021-08-11 MED ORDER — FLUTICASONE FUROATE-VILANTEROL 200-25 MCG/ACT IN AEPB: 1.0000 | INHALATION_SPRAY | Freq: Every day | RESPIRATORY_TRACT | Status: DC

## 2021-08-11 MED ORDER — FLUTICASONE FUROATE-VILANTEROL 200-25 MCG/ACT IN AEPB
1.0000 | INHALATION_SPRAY | Freq: Every day | RESPIRATORY_TRACT | Status: DC
  Administered 2021-08-12 – 2021-08-15 (×4): 1 via RESPIRATORY_TRACT
  Filled 2021-08-11: qty 28

## 2021-08-11 MED ORDER — ALBUTEROL SULFATE (2.5 MG/3ML) 0.083% IN NEBU: 2.5000 mg | INHALATION_SOLUTION | Freq: Four times a day (QID) | RESPIRATORY_TRACT | Status: DC | PRN

## 2021-08-11 MED ORDER — ALBUTEROL SULFATE HFA 108 (90 BASE) MCG/ACT IN AERS: 2.0000 | INHALATION_SPRAY | Freq: Four times a day (QID) | RESPIRATORY_TRACT | Status: DC | PRN

## 2021-08-11 MED ORDER — DIATRIZOATE MEGLUMINE & SODIUM 66-10 % PO SOLN
90.0000 mL | Freq: Once | ORAL | Status: DC
  Filled 2021-08-11: qty 90

## 2021-08-11 MED ORDER — LORAZEPAM 2 MG/ML IJ SOLN: 0.5000 mg | Freq: Two times a day (BID) | INTRAMUSCULAR | Status: DC | PRN

## 2021-08-11 NOTE — Assessment & Plan Note (Signed)
Hold crestor while NPO

## 2021-08-11 NOTE — Assessment & Plan Note (Addendum)
-  found to have high grade SBO likely secondary to adhesions from prior surgery.  -improving on own -General surgery consulted but now has signed off -full liquid diet- advance to soft in the AM -palliative care: MOST form

## 2021-08-11 NOTE — H&P (Signed)
History and Physical    Tina Riley ITG:[id number] DOB: 05/29/1932 DOA: 08/10/2021  PCP: Myrlene Broker, MD Consultants:  GI: Dr. Leone Payor Patient coming from: [facility]. Lives alone in handicap equipped apartment.   Chief Complaint: abdominal pain/vomiting   HPI: Tina Riley is a 88 y.o. female with medical history significant of GERD, HTN, IBS, frequent UTIs, partial colectomy in 1980s due to "kink" who presented to ED with nausea/vomiting x 36 hours and abdominal pain. pain is in upper quadrants with no radiation. Rated as a 9/10 and described as achy. Pain was constant. Last BM was Sunday and was somewhat normal. It's wasn't runny. Last passed gas yesterday. She has had poor PO intake. She had had ongoing diarrhea for months and is being followed by GI for this.   Surgical history of partial colectomy in th 1980s, appendectomy, hysterectomy and cholecystectomy. Last colonoscopy in the 1980s prior to colectomy.   Indendepent with all of her ADLs, does not drive. Family brings groceries.   Denies any fever/chills, vision changes, chest pain/palpitations, shortness of breath, cough, dysuria/urgency/frequency, leg swelling, rashes. Denies any blood in stool or vomit.   ED Course: vitals: temp: 97.3, bp: 127/60, HR: 69, RR: 18, oxygen: 100%RA Pertinent labs: mcv: 107, potassium: 5.5, BUN: 36, creatinine: 1.11,  CT abdomen/pelvis: high grade mechanical SBO with transition pont visualized at left abdominal surgical small bowel anastomosis.  IN ED bolused 1L, given morphine, zofran. Surgery consulted and [facility] asked to admit.   Review of Systems: As per HPI; otherwise review of systems reviewed and negative.   Ambulatory Status:  Ambulates with walker, if long: WC    Past Medical History:  Diagnosis Date   Arthritis    Asthma    Chicken pox    Depression    Diverticulitis    Genital warts    GERD (gastroesophageal reflux disease)    Hay fever    History of  frequent urinary tract infections    Hypertension    IBS (irritable bowel syndrome)    Lactose intolerance    Peripheral neuropathy    Urinary incontinence     Past Surgical History:  Procedure Laterality Date   ABDOMINAL HYSTERECTOMY     Hip Replacement  2020   PARTIAL COLECTOMY  1980s   TONSILLECTOMY AND ADENOIDECTOMY  childhood   vocal cord tumor removal  2012    Social History   Socioeconomic History   Marital status: Widowed    Spouse name: Not on file   Number of children: Not on file   Years of education: Not on file   Highest education level: Not on file  Occupational History   Not on file  Tobacco Use   Smoking status: Never   Smokeless tobacco: Never  Vaping Use   Vaping Use: Never used  Substance and Sexual Activity   Alcohol use: Never   Drug use: Never   Sexual activity: Not Currently  Other Topics Concern   Not on file  Social History Narrative   She is widowed and reports having 1 son   Her nephew Marthann Schiller helps   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   Never smoker no alcohol 1 caffeinated beverage daily no drug use   She lives independently in [employer] apartment   Social Determinants of Corporate investment banker Strain: Not on file  Food Insecurity: No Food Insecurity   Worried About Programme researcher, broadcasting/film/video in the Last Year: Never true  Ran Out of Food in the Last Year: Never true  Transportation Needs: No Transportation Needs   Lack of Transportation (Medical): No   Lack of Transportation (Non-Medical): No  Physical Activity: Not on file  Stress: Not on file  Social Connections: Socially Isolated   Frequency of Communication with Friends and Family: Twice a week   Frequency of Social Gatherings with Friends and Family: Once a week   Attends Religious Services: Never   Marine scientist or Organizations: No   Attends Archivist Meetings: Never   Marital Status: Widowed  Intimate Partner  Violence: Not on file    Allergies  Allergen Reactions   Cephalexin Palpitations   Ciprofloxacin Palpitations   Other Itching   Meperidine Hcl     Other reaction(s): Other Doesn't work   Sulfa Antibiotics Nausea And Vomiting   Cefdinir Nausea Only   Doxycycline Nausea Only    Family History  Problem Relation Age of Onset   Arthritis Mother    COPD Mother    Heart attack Father    Heart disease Father    Hypertension Father    Hyperlipidemia Father    Stroke Father    Stroke Sister    Hypertension Sister    Hyperlipidemia Sister    Hearing loss Sister    Cancer Sister    Arthritis Sister    Alcohol abuse Son    Diabetes Son    Drug abuse Son    Heart attack Son    Hypertension Son     Prior to Admission medications   Medication Sig Start Date End Date Taking? Authorizing Provider  acetaminophen (TYLENOL) 500 MG tablet Take 500 mg by mouth daily as needed for headache or mild pain.   Yes [provider]  albuterol (VENTOLIN HFA) 108 (90 Base) MCG/ACT inhaler Inhale 2 puffs into the lungs every 6 (six) hours as needed for wheezing or shortness of breath. 06/02/21  Yes Hoyt Koch, MD  clonazePAM Bobbye [place]) 0.5 MG tablet Take 1 tablet (0.5 mg total) by mouth 2 (two) times daily as needed for anxiety 02/23/21 08/22/21 Yes Hoyt Koch, MD  dicyclomine (BENTYL) 10 MG capsule Take 1 capsule (10 mg total) by mouth 2 (two) times daily as needed. 01/16/21  Yes Hoyt Koch, MD  gabapentin (NEURONTIN) 100 MG capsule Take 1 capsule (100 mg total) by mouth 3 (three) times daily. Patient taking differently: Take 300 mg by mouth at bedtime. 06/08/21  Yes Hoyt Koch, MD  levocetirizine (XYZAL) 5 MG tablet Take 1 tablet (5 mg total) by mouth daily. Patient taking differently: Take 5 mg by mouth every evening. 11/25/20  Yes Hoyt Koch, MD  loperamide (IMODIUM) 2 MG capsule Take 2 mg by mouth as needed for diarrhea or loose stools (do  not exceed 8 capsules a day).   Yes [provider]  pantoprazole (PROTONIX) 40 MG tablet Take 1 tablet (40 mg total) by mouth 2 (two) times daily. Patient taking differently: Take 40 mg by mouth daily. 02/23/21  Yes Hoyt Koch, MD  propranolol ER (INDERAL LA) 60 MG 24 hr capsule Take 1 capsule (60 mg total) by mouth daily. 05/26/21  Yes Hoyt Koch, MD  rosuvastatin (CRESTOR) 5 MG tablet Take 1 tablet (5 mg total) by mouth at bedtime. 11/25/20  Yes Hoyt Koch, MD  SYMBICORT 160-4.5 MCG/ACT inhaler Inhale 2 puffs into the lungs 2 (two) times daily. Patient taking differently: Inhale 2 puffs into  the lungs daily. 11/03/20  Yes Hoyt Koch, MD  valACYclovir (VALTREX) 1000 MG tablet TAKE 1 TABLET (1,000 MG TOTAL) BY MOUTH DAILY. Patient taking differently: Take 1,000 mg by mouth at bedtime. 10/15/20 11/02/21 Yes Hoyt Koch, MD  aspirin EC 81 MG tablet Take 81 mg by mouth daily. Swallow whole. Patient not taking: Reported on 07/28/2021    [provider]  RESTASIS 0.05 % ophthalmic emulsion Place 1 drop into both eyes 2 (two) times daily. Patient not taking: Reported on 07/28/2021 05/21/20   [provider]    Physical Exam: Vitals:   08/11/21 1130 08/11/21 1200 08/11/21 1230 08/11/21 1403  BP: 134/61 135/62 140/69 (!) 119/57  Pulse: 86 88 92 87  Resp: 17 17 19 16   Temp:    98.3 F (36.8 C)  TempSrc:    Oral  SpO2: 96% 93% 95% 90%  Weight:      Height:         General:  Appears calm and comfortable and is in NAD. Cachetic and frail appearing Eyes:  PERRL, EOMI, normal lids, iris ENT:  grossly normal hearing, lips & tongue, mmm; no teeth  Neck:  no LAD, masses or thyromegaly; no carotid bruits Cardiovascular:  RRR, no m/r/g. No LE edema.  Respiratory:   CTA bilaterally with no wheezes/rales/rhonchi.  Normal respiratory effort. Abdomen:  soft, NT on exam. No rebound or guarding, mild distention NABS Back:   normal  alignment, no CVAT Skin:  no rash or induration seen on limited exam Musculoskeletal:  grossly normal tone BUE/BLE, good ROM, no bony abnormality Lower extremity:  No LE edema.  Limited foot exam with no ulcerations.  2+ distal pulses. Psychiatric:  grossly normal mood and affect, speech fluent and appropriate, AOx3 Neurologic:  CN 2-12 grossly intact, moves all extremities in coordinated fashion, sensation intact    Radiological Exams on Admission: Independently reviewed - see discussion in A/P where applicable  DG Abd 1 View  Result Date: 08/11/2021 CLINICAL DATA:  NG tube placement EXAM: ABDOMEN - 1 VIEW COMPARISON:  08/10/2021 FINDINGS: NG tube appears to be coiled within the upper thoracic esophagus. There are linear densities in the lower lung fields. IMPRESSION: NG tube appears to be coiled within upper thoracic esophagus. Electronically Signed   By: Elmer Picker M.D.   On: 08/11/2021 18:14   DG Abd 1 View  Result Date: 08/11/2021 CLINICAL DATA:  NG tube placement EXAM: ABDOMEN - 1 VIEW COMPARISON:  Study done earlier today FINDINGS: NG tube appears to be coiled within upper thoracic esophagus. IMPRESSION: NG tube appears to be coiled within upper thoracic esophagus and should be repositioned. Electronically Signed   By: Elmer Picker M.D.   On: 08/11/2021 18:12   CT ABDOMEN PELVIS W CONTRAST  Result Date: 08/10/2021 CLINICAL DATA:  Abdomen pain with nausea vomiting EXAM: CT ABDOMEN AND PELVIS WITH CONTRAST TECHNIQUE: Multidetector CT imaging of the abdomen and pelvis was performed using the standard protocol following bolus administration of intravenous contrast. RADIATION DOSE REDUCTION: This exam was performed according to the departmental dose-optimization program which includes automated exposure control, adjustment of the mA and/or kV according to patient size and/or use of iterative reconstruction technique. CONTRAST:  97mL OMNIPAQUE IOHEXOL 300 MG/ML  SOLN  COMPARISON:  CT 07/28/2021 FINDINGS: Lower chest: Lung bases demonstrate mild subpleural fibrosis. Elevation of right diaphragm. Coronary vascular calcification. No consolidation or effusion. Small hiatal hernia. Hepatobiliary: Multiple hepatic cysts. Status post cholecystectomy. Mild intra and extrahepatic biliary dilatation.  Common bile duct dilated up to 8 mm. Additional subcentimeter hypodensities in the liver too small to further characterize Pancreas: Unremarkable. No pancreatic ductal dilatation or surrounding inflammatory changes. Spleen: Normal in size without focal abnormality. Adrenals/Urinary Tract: Adrenal glands are normal. Kidneys show no hydronephrosis. Right renal cysts. Subcentimeter hypodense renal lesions too small to further characterize. The bladder is unremarkable Stomach/Bowel: The stomach is nondistended. There are multiple fluid filled dilated loops of small bowel measuring up to 3.5 cm. Fecalized segment left abdominal small bowel proximal to a surgical anastomosis with transition to completely decompressed small bowel distal to this, coronal series 5, image 56. Patient is status post partial right colectomy with ileocolic anastomosis in the right anterior abdomen. The colon is decompressed. 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 No aneurysm. No suspicious lymph nodes. Reproductive: Status post hysterectomy. No adnexal masses. Other: Negative for pelvic effusion or free air. Musculoskeletal: Left hip replacement with artifact. No acute osseous abnormality. IMPRESSION: 1. Findings consistent with high-grade mechanical small bowel obstruction with transition point visualized at left abdominal surgical small bowel anastomosis. Negative for intramural air or free air. 2. Patient is status post partial colectomy. Diverticular disease of residual left colon without acute wall thickening 3. Status post cholecystectomy.  Intra and extrahepatic biliary dilatation probably due to postsurgical change Electronically Signed   By: Donavan Foil M.D.   On: 08/10/2021 20:02   DG Abd Portable 1V-Small Bowel Protocol-Position Verification  Result Date: 08/11/2021 CLINICAL DATA:  Small-bowel obstruction, NG tube placement EXAM: PORTABLE ABDOMEN - 1 VIEW COMPARISON:  08/10/2021 FINDINGS: Stomach is not distended. There is haziness in the mid and lower abdomen, possibly due to fluid-filled small bowel loops. Enteric tube is not seen in the lower thoracic esophagus or stomach and may be coiled within the patient's throat. Linear densities are seen in the lower lung fields suggesting scarring or subsegmental atelectasis. IMPRESSION: NG tube is not seen in the region of lower thoracic esophagus or stomach. Repositioning of NG tube should be considered. Electronically Signed   By: Elmer Picker M.D.   On: 08/11/2021 18:11   DG Abd Portable 1 View  Result Date: 08/10/2021 CLINICAL DATA:  NG placement. EXAM: PORTABLE ABDOMEN - 1 VIEW COMPARISON:  CT abdomen pelvis dated 08/10/2021. FINDINGS: An enteric tube is not visualized. Support lines noted over the patient. Excreted contrast seen in the renal collecting system. Degenerative changes of the spine. IMPRESSION: No enteric tube seen. Electronically Signed   By: Anner Crete M.D.   On: 08/10/2021 22:05    EKG: Independently reviewed.  NSR with rate 70; nonspecific ST changes with no evidence of acute ischemia   Labs on Admission: I have personally reviewed the available labs and imaging studies at the time of the admission.  Pertinent labs:  mcv: 107,  potassium: 5.5,  BUN: 36,  creatinine: 1.11,    Assessment/Plan * SBO (small bowel obstruction) ([place])- (present on admission) 88 year old female presenting with 36 hours of abdominal pain N/V found to have high grade SBO.likely secondary to adhesions from prior surgery.  -Admit to MedSurg and make n.p.o. -no clinical  findings for emergent surgery at this time  -Gentle IV fluid hydration -Pain medication-1mg  mprphine q3 hours prn  -Monitor electrolytes -General surgery consulted and will place consult note but not a surgical candidate at this time and we can manage conservatively -NG tube has been attempted several times without success. Per surgery removed for  the evening.    Hyperkalemia- (present on admission) 5.5 when seen at [facility] and I have ordered a stat repeat potassium Telemetry   GERD (gastroesophageal reflux disease)- (present on admission) Continue protonix-IV while NPO  Asthma- (present on admission) No signs of exacerbation at this time Continue symbicort and albuterol prn   Anxiety- (present on admission) On klonopin bid prn, nephew states she really needs this Will do ativan while NPO  Hyperlipidemia- (present on admission) Hold crestor while NPO   Abnormal urine- (present on admission) Hx of frequent UTI UA appears infected. She is completely asymptomatic and denies any dysuria, frequency or urgency  Hold tx for asymptomatic infection tx'd for UTI in ED with macrobid on 07/28/21, sensitive on culture       Body mass index is 20.2 kg/m.  MAR will need to be completed when no longer NPO  Level of care: Telemetry Medical DVT prophylaxis:  SCDs Code Status:  Full - confirmed with patient/family Family Communication: Talmadge Chad, nephew. MPOA Disposition Plan:  The patient is from: home  Anticipated d/c is to: home   Requires inpatient hospitalization and is at significant risk of worsening, requires constant monitoring, IVF, NG tube and assessment and MDM with specialists.     Patient is currently: acutely ill Consults called: general surgery  Admission status:  inpatient   Orma Flaming MD [facility]   How to contact the [facility] Attending or Consulting provider [place] or covering provider during after hours [place], for this patient?  Check  the care team in [facility] and look for a) attending/consulting [facility] provider listed and b) the [facility] team listed Log into [email or web address] and use [facility]'s universal password to access. If you do not have the password, please contact the hospital operator. Locate the [facility] provider you are looking for under [facility] and page to a number that you can be directly reached. If you still have difficulty reaching the provider, please page the [facility] (Director on Call) for the Hospitalists listed on amion for assistance.   08/11/2021, 6:31 PM

## 2021-08-11 NOTE — Telephone Encounter (Signed)
Noted  

## 2021-08-11 NOTE — Assessment & Plan Note (Addendum)
Continue protonix-IV while NPO- if tolerates PO today, will change back to home med

## 2021-08-11 NOTE — Assessment & Plan Note (Addendum)
On klonopin bid prn- change back to PO

## 2021-08-11 NOTE — Assessment & Plan Note (Signed)
No signs of exacerbation at this time Continue symbicort and albuterol prn

## 2021-08-11 NOTE — Consult Note (Addendum)
Consult Note  [facility] Apr 21, 1932  [id number].    Requesting MD: Dr. Zenia Resides Chief Complaint/Reason for Consult: Small bowel obstruction  HPI:  88 yo female with medical history of diverticulitis, asthma, GERD, HTN, IBS, frequent UTIs who presented to [place] ED with abdominal pain and emesis for the last 3-4 days.   Hx provided by patient and family that is in the room. Patient reports for > 6 months she has been dealing with alternating constipation/diarrhea and fecal incontinence that is preceded by lower abdominal pain. She has been following with Dr. Carlean Purl for this. She tried benefiber for this but felt this exacerbated it so she stopped using this. Over the last 2 weeks, her diarrhea has become more frequent (almost daily) for which she has been trying imodium periodically for. On Saturday the patient reports her pain changed and she began having more generalized abdominal pain that was worse in the upper abdomen with associated distension, nausea and bilious like emesis. She reports she was able to have a formed bm on Saturday after symptom onset without relief.   Symptoms persisted and she presented to the ED for evaluation on 1/23. Work up in ED significant for WBC WNL, CT scan showing high grade small bowel obstruction at area of prior anastomosis. It does look like there is some fecalization of the small bowel on CT leading up to the anastomosis. Patient transferred to [facility] hospital to medicine service and general surgery asked to see in regard to SBO.  Patient has a history of partial colectomy for what she describes as a "kink" in her colon, appendectomy, hysterectomy, and cholecystectomy. She denies hx of other bowel obstructions since the admission she needed a colectomy for. No hx of similar symptoms. She has previously received majority of medical care at through [facility]. She says her last colonoscopy was > 10 years ago.   Patient  reports she is still nauseated but has not had any emesis since admission. She is having improved but continued generalized abdominal pain that is currently mild to moderate. She reports last episode of flatus was last night. She reports she lives in an apartment alone. She ambulates with a walker ~26ft before having to take a break. Her family does her grocery shopping etc for her. She usually needs a wheelchair if she leaves her home.    ROS: Review of Systems  Constitutional:  Negative for chills and fever.  Respiratory:  Negative for cough, shortness of breath and wheezing.   Cardiovascular:  Negative for chest pain, palpitations and leg swelling.  Gastrointestinal:  Positive for abdominal pain, nausea and vomiting.  Genitourinary: Negative.   All other systems reviewed and are negative.  Family History  Problem Relation Age of Onset   Arthritis Mother    COPD Mother    Heart attack Father    Heart disease Father    Hypertension Father    Hyperlipidemia Father    Stroke Father    Stroke Sister    Hypertension Sister    Hyperlipidemia Sister    Hearing loss Sister    Cancer Sister    Arthritis Sister    Alcohol abuse Son    Diabetes Son    Drug abuse Son    Heart attack Son    Hypertension Son     Past Medical History:  Diagnosis Date   Arthritis    Asthma    Chicken pox    Depression  Diverticulitis    Genital warts    GERD (gastroesophageal reflux disease)    Hay fever    History of frequent urinary tract infections    Hypertension    IBS (irritable bowel syndrome)    Lactose intolerance    Peripheral neuropathy    Urinary incontinence     Past Surgical History:  Procedure Laterality Date   ABDOMINAL HYSTERECTOMY     Hip Replacement  2020   PARTIAL COLECTOMY  1980s   TONSILLECTOMY AND ADENOIDECTOMY  childhood   vocal cord tumor removal  2012    Social History:  reports that she has never smoked. She has never used smokeless tobacco. She reports that  she does not drink alcohol and does not use drugs. Substance use: denies substance use Allergies: cephalosporins - nausea, palpitations. Cipro - palpitations. Sulfa abx - nausea and emesis. Doxycycline - nausea Blood thinners: none  Allergies:  Allergies  Allergen Reactions   Cephalexin Palpitations   Ciprofloxacin Palpitations   Other Itching   Meperidine Hcl     Other reaction(s): Other Doesn't work   Sulfa Antibiotics Nausea And Vomiting   Cefdinir Nausea Only   Doxycycline Nausea Only    Medications Prior to Admission  Medication Sig Dispense Refill   acetaminophen (TYLENOL) 500 MG tablet Take 500 mg by mouth daily as needed for headache or mild pain.     albuterol (VENTOLIN HFA) 108 (90 Base) MCG/ACT inhaler Inhale 2 puffs into the lungs every 6 (six) hours as needed for wheezing or shortness of breath. 8.5 g 2   clonazePAM (KLONOPIN) 0.5 MG tablet Take 1 tablet (0.5 mg total) by mouth 2 (two) times daily as needed for anxiety 60 tablet 5   dicyclomine (BENTYL) 10 MG capsule Take 1 capsule (10 mg total) by mouth 2 (two) times daily as needed. 180 capsule 3   gabapentin (NEURONTIN) 100 MG capsule Take 1 capsule (100 mg total) by mouth 3 (three) times daily. (Patient taking differently: Take 300 mg by mouth at bedtime.) 270 capsule 0   levocetirizine (XYZAL) 5 MG tablet Take 1 tablet (5 mg total) by mouth daily. (Patient taking differently: Take 5 mg by mouth every evening.) 90 tablet 3   loperamide (IMODIUM) 2 MG capsule Take 2 mg by mouth as needed for diarrhea or loose stools (do not exceed 8 capsules a day).     pantoprazole (PROTONIX) 40 MG tablet Take 1 tablet (40 mg total) by mouth 2 (two) times daily. (Patient taking differently: Take 40 mg by mouth daily.) 180 tablet 3   propranolol ER (INDERAL LA) 60 MG 24 hr capsule Take 1 capsule (60 mg total) by mouth daily. 90 capsule 1   rosuvastatin (CRESTOR) 5 MG tablet Take 1 tablet (5 mg total) by mouth at bedtime. 90 tablet 3    SYMBICORT 160-4.5 MCG/ACT inhaler Inhale 2 puffs into the lungs 2 (two) times daily. (Patient taking differently: Inhale 2 puffs into the lungs daily.) 10.2 g 11   valACYclovir (VALTREX) 1000 MG tablet TAKE 1 TABLET (1,000 MG TOTAL) BY MOUTH DAILY. (Patient taking differently: Take 1,000 mg by mouth at bedtime.) 90 tablet 3   aspirin EC 81 MG tablet Take 81 mg by mouth daily. Swallow whole. (Patient not taking: Reported on 07/28/2021)     RESTASIS 0.05 % ophthalmic emulsion Place 1 drop into both eyes 2 (two) times daily. (Patient not taking: Reported on 07/28/2021)      Blood pressure (!) 119/57, pulse 87, temperature 98.3 F (  36.8 C), temperature source Oral, resp. rate 16, height 4\' 11"  (1.499 m), weight 45.4 kg, SpO2 90 %. Physical Exam: General: pleasant, WD, female who is laying in bed in NAD HEENT: head is normocephalic, atraumatic.  Sclera are noninjected.  Pupils equal and round. EOMs intact.  Ears and nose without any masses or lesions.  Mouth is pink and moist Heart: regular, rate, and rhythm.  Normal s1,s2. No obvious murmurs.  Palpable radial pulses bilaterally Lungs: CTAB, no wheezes, rhonchi, or rales noted.  Respiratory effort nonlabored Abd: Soft, lower abdominal distension, mild diffuse/generalized tenderness without peritonitis, +BS, no masses, hernias, or organomegaly. Midline wound noted and well healed.  MSK: all 4 extremities are symmetrical with no cyanosis, clubbing, or edema. Skin: warm and dry with no masses, lesions, or rashes Neuro: Cranial nerves 2-12 grossly intact, sensation is normal throughout, moves all extremities, gait not assessed Psych: A&Ox3 with an appropriate affect.    Results for orders placed or performed during the hospital encounter of 08/10/21 (from the past 48 hour(s))  Lipase, blood     Status: None   Collection Time: 08/10/21  6:30 PM  Result Value Ref Range   Lipase 12 11 - 51 U/L    Comment: Performed at [facility], [address], [place], [place] [address]  Comprehensive metabolic panel     Status: Abnormal   Collection Time: 08/10/21  6:30 PM  Result Value Ref Range   Sodium 135 135 - 145 mmol/L   Potassium 5.5 (H) 3.5 - 5.1 mmol/L   Chloride 97 (L) 98 - 111 mmol/L   CO2 23 22 - 32 mmol/L   Glucose, Bld 111 (H) 70 - 99 mg/dL    Comment: Glucose reference range applies only to samples taken after fasting for at least 8 hours.   BUN 36 (H) 8 - 23 mg/dL   Creatinine, Ser 1.11 (H) 0.44 - 1.00 mg/dL   Calcium 9.6 8.9 - 10.3 mg/dL   Total Protein 7.7 6.5 - 8.1 g/dL   Albumin 4.5 3.5 - 5.0 g/dL   AST 23 15 - 41 U/L   ALT 7 0 - 44 U/L   Alkaline Phosphatase 70 38 - 126 U/L   Total Bilirubin 0.8 0.3 - 1.2 mg/dL   GFR, Estimated 48 (L) >60 mL/min    Comment: (NOTE) Calculated using the CKD-EPI Creatinine Equation (2021)    Anion gap 15 5 - 15    Comment: Performed at [facility], [address] [place], [place] [address]  CBC     Status: Abnormal   Collection Time: 08/10/21  6:30 PM  Result Value Ref Range   WBC 8.3 4.0 - 10.5 K/uL   RBC 4.13 3.87 - 5.11 MIL/uL   Hemoglobin 14.9 12.0 - 15.0 g/dL   HCT 44.5 36.0 - 46.0 %   MCV 107.7 (H) 80.0 - 100.0 fL   MCH 36.1 (H) 26.0 - 34.0 pg   MCHC 33.5 30.0 - 36.0 g/dL   RDW 12.9 11.5 - 15.5 %   Platelets 278 150 - 400 K/uL   nRBC 0.0 0.0 - 0.2 %    Comment: Performed at [facility], [address], [place], [place] [address]  Resp Panel by RT-PCR (Flu A&B, Covid) Nasopharyngeal Swab     Status: None   Collection Time: 08/10/21  7:26 PM   Specimen: Nasopharyngeal Swab; Nasopharyngeal(NP) swabs in vial transport medium  Result Value Ref Range   SARS Coronavirus 2 by RT  PCR NEGATIVE NEGATIVE    Comment: (NOTE) SARS-CoV-2 target nucleic acids are NOT DETECTED.  The SARS-CoV-2 RNA is generally detectable in upper respiratory specimens during the acute phase of infection. The lowest concentration of SARS-CoV-2  viral copies this assay can detect is 138 copies/mL. A negative result does not preclude SARS-Cov-2 infection and should not be used as the sole basis for treatment or other patient management decisions. A negative result may occur with  improper specimen collection/handling, submission of specimen other than nasopharyngeal swab, presence of viral mutation(s) within the areas targeted by this assay, and inadequate number of viral copies(<138 copies/mL). A negative result must be combined with clinical observations, patient history, and epidemiological information. The expected result is Negative.  Fact Sheet for Patients:  EntrepreneurPulse.com.au  Fact Sheet for Healthcare Providers:  IncredibleEmployment.be  This test is no t yet approved or cleared by the [place] FDA and  has been authorized for detection and/or diagnosis of SARS-CoV-2 by FDA under an Emergency Use Authorization (EUA). This EUA will remain  in effect (meaning this test can be used) for the duration of the COVID-19 declaration under Section 564(b)(1) of the Act, 21 U.S.C.section 360bbb-3(b)(1), unless the authorization is terminated  or revoked sooner.       Influenza A by PCR NEGATIVE NEGATIVE   Influenza B by PCR NEGATIVE NEGATIVE    Comment: (NOTE) The Xpert Xpress SARS-CoV-2/FLU/RSV plus assay is intended as an aid in the diagnosis of influenza from Nasopharyngeal swab specimens and should not be used as a sole basis for treatment. Nasal washings and aspirates are unacceptable for Xpert Xpress SARS-CoV-2/FLU/RSV testing.  Fact Sheet for Patients: EntrepreneurPulse.com.au  Fact Sheet for Healthcare Providers: IncredibleEmployment.be  This test is not yet approved or cleared by the [place] FDA and has been authorized for detection and/or diagnosis of SARS-CoV-2 by FDA under an Emergency Use Authorization (EUA). This EUA  will remain in effect (meaning this test can be used) for the duration of the COVID-19 declaration under Section 564(b)(1) of the Act, 21 U.S.C. section 360bbb-3(b)(1), unless the authorization is terminated or revoked.  Performed at [facility], [address] [place], [place] [address]   Urinalysis, Routine w reflex microscopic     Status: Abnormal   Collection Time: 08/11/21  1:34 AM  Result Value Ref Range   Color, Urine YELLOW YELLOW   APPearance CLEAR CLEAR   Specific Gravity, Urine >1.046 (H) 1.005 - 1.030   pH 6.0 5.0 - 8.0   Glucose, UA NEGATIVE NEGATIVE mg/dL   Hgb urine dipstick SMALL (A) NEGATIVE   Bilirubin Urine NEGATIVE NEGATIVE   Ketones, ur 40 (A) NEGATIVE mg/dL   Protein, ur 30 (A) NEGATIVE mg/dL   Nitrite POSITIVE (A) NEGATIVE   Leukocytes,Ua LARGE (A) NEGATIVE   RBC / HPF 6-10 0 - 5 RBC/hpf   WBC, UA >50 (H) 0 - 5 WBC/hpf   Bacteria, UA FEW (A) NONE SEEN   Squamous Epithelial / LPF 0-5 0 - 5   Mucus PRESENT    Hyaline Casts, UA PRESENT     Comment: Performed at [facility], [address] [place], [place] [address]   CT ABDOMEN PELVIS W CONTRAST  Result Date: 08/10/2021 CLINICAL DATA:  Abdomen pain with nausea vomiting EXAM: CT ABDOMEN AND PELVIS WITH CONTRAST TECHNIQUE: Multidetector CT imaging of the abdomen and pelvis was performed using the standard protocol following bolus administration of intravenous contrast. RADIATION DOSE REDUCTION: This exam was performed according to the  departmental dose-optimization program which includes automated exposure control, adjustment of the mA and/or kV according to patient size and/or use of iterative reconstruction technique. CONTRAST:  75mL OMNIPAQUE IOHEXOL 300 MG/ML  SOLN COMPARISON:  CT 07/28/2021 FINDINGS: Lower chest: Lung bases demonstrate mild subpleural fibrosis. Elevation of right diaphragm. Coronary vascular calcification. No consolidation or effusion. Small hiatal  hernia. Hepatobiliary: Multiple hepatic cysts. Status post cholecystectomy. Mild intra and extrahepatic biliary dilatation. Common bile duct dilated up to 8 mm. Additional subcentimeter hypodensities in the liver too small to further characterize Pancreas: Unremarkable. No pancreatic ductal dilatation or surrounding inflammatory changes. Spleen: Normal in size without focal abnormality. Adrenals/Urinary Tract: Adrenal glands are normal. Kidneys show no hydronephrosis. Right renal cysts. Subcentimeter hypodense renal lesions too small to further characterize. The bladder is unremarkable Stomach/Bowel: The stomach is nondistended. There are multiple fluid filled dilated loops of small bowel measuring up to 3.5 cm. Fecalized segment left abdominal small bowel proximal to a surgical anastomosis with transition to completely decompressed small bowel distal to this, coronal series 5, image 56. Patient is status post partial right colectomy with ileocolic anastomosis in the right anterior abdomen. The colon is decompressed. 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 No aneurysm. No suspicious lymph nodes. Reproductive: Status post hysterectomy. No adnexal masses. Other: Negative for pelvic effusion or free air. Musculoskeletal: Left hip replacement with artifact. No acute osseous abnormality. IMPRESSION: 1. Findings consistent with high-grade mechanical small bowel obstruction with transition point visualized at left abdominal surgical small bowel anastomosis. Negative for intramural air or free air. 2. Patient is status post partial colectomy. Diverticular disease of residual left colon without acute wall thickening 3. Status post cholecystectomy. Intra and extrahepatic biliary dilatation probably due to postsurgical change Electronically Signed   By: Donavan Foil M.D.   On: 08/10/2021 20:02   DG Abd Portable 1 View  Result Date:  08/10/2021 CLINICAL DATA:  NG placement. EXAM: PORTABLE ABDOMEN - 1 VIEW COMPARISON:  CT abdomen pelvis dated 08/10/2021. FINDINGS: An enteric tube is not visualized. Support lines noted over the patient. Excreted contrast seen in the renal collecting system. Degenerative changes of the spine. IMPRESSION: No enteric tube seen. Electronically Signed   By: Anner Crete M.D.   On: 08/10/2021 22:05      Assessment/Plan SBO - No current indication for emergency surgery - Place NGT for decompression and keep NPO - Start SBO protocol  - Keep K > 4 and Mg > 2 for bowel function - Mobilize for bowel function - Hopefully patient will improve with conservative management. Her obstruction is at the area of her prior anastomosis so will need to be watched closely. If patient fails to improve with conservative management, discussed she may require exploratory surgery during admission.  - Agree with medical admission. We will follow with you.   FEN: NPO/NGT LIWS ID: none VTE: okay for chemical prophylaxis from surgical standpoint   This care required moderate level of medical decision making.   Alferd Apa, [facility] Surgery 08/11/2021, 2:44 PM Please see Amion for pager number during day hours 7:00am-4:30pm

## 2021-08-11 NOTE — Assessment & Plan Note (Addendum)
resolved 

## 2021-08-11 NOTE — ED Notes (Signed)
Care Handoff provided to [facility]. All Questions answered.

## 2021-08-11 NOTE — Assessment & Plan Note (Addendum)
Hx of frequent UTI UA appears infected. She is completely asymptomatic and denies any dysuria, frequency or urgency  Hold tx for asymptomatic infection tx'd for UTI in ED with macrobid on 07/28/21, sensitive on culture

## 2021-08-11 NOTE — Progress Notes (Signed)
1st attempt to place NG tube unsuccessful in the right nare.  The tube coiled in the patient's mouth.  2nd attempt at placing the NG tube with assist from Windell Moulding, [place] in the left nare.  NG tube successfully passed through mouth and throat but xray showed that NG tube was coiled.  Patient showing no distress at this time.  Notified surgery, Phylliss Blakes, MD regarding multiple attempts with placing the NG tube unsuccessfully, 3 times in the ED and 2 times on the floor.  NG tube was removed.  Patient will continue to remain NPO per MD order.  Will continue to monitor.  Patient currently without nausea or vomiting.

## 2021-08-12 ENCOUNTER — Inpatient Hospital Stay (HOSPITAL_COMMUNITY): Payer: Medicare Other

## 2021-08-12 DIAGNOSIS — E875 Hyperkalemia: Secondary | ICD-10-CM

## 2021-08-12 DIAGNOSIS — E871 Hypo-osmolality and hyponatremia: Secondary | ICD-10-CM

## 2021-08-12 LAB — BASIC METABOLIC PANEL
Anion gap: 9 (ref 5–15)
BUN: 20 mg/dL (ref 8–23)
CO2: 18 mmol/L — ABNORMAL LOW (ref 22–32)
Calcium: 7.9 mg/dL — ABNORMAL LOW (ref 8.9–10.3)
Chloride: 104 mmol/L (ref 98–111)
Creatinine, Ser: 0.96 mg/dL (ref 0.44–1.00)
GFR, Estimated: 57 mL/min — ABNORMAL LOW (ref >60)
Glucose, Bld: 70 mg/dL (ref 70–99)
Potassium: 4.1 mmol/L (ref 3.5–5.1)
Sodium: 131 mmol/L — ABNORMAL LOW (ref 135–145)

## 2021-08-12 LAB — CBC
HCT: 35.1 % — ABNORMAL LOW (ref 36.0–46.0)
Hemoglobin: 11.5 g/dL — ABNORMAL LOW (ref 12.0–15.0)
MCH: 35.4 pg — ABNORMAL HIGH (ref 26.0–34.0)
MCHC: 32.8 g/dL (ref 30.0–36.0)
MCV: 108 fL — ABNORMAL HIGH (ref 80.0–100.0)
Platelets: 177 10*3/uL (ref 150–400)
RBC: 3.25 MIL/uL — ABNORMAL LOW (ref 3.87–5.11)
RDW: 12.6 % (ref 11.5–15.5)
WBC: 6.6 10*3/uL (ref 4.0–10.5)
nRBC: 0 % (ref 0.0–0.2)

## 2021-08-12 MED ORDER — HYDROCERIN EX CREA
TOPICAL_CREAM | Freq: Every day | CUTANEOUS | Status: DC
  Filled 2021-08-12: qty 113

## 2021-08-12 NOTE — Assessment & Plan Note (Addendum)
Mild.

## 2021-08-12 NOTE — Progress Notes (Signed)
NGT was not able to placed after several attempts yesterday. Gastrografin is not given and pt also refusing to take it PO. Tina Capers, NP made aware.

## 2021-08-12 NOTE — Plan of Care (Signed)

## 2021-08-12 NOTE — Progress Notes (Signed)
Progress Note     Subjective: Has passed small amount of flatus this am but with stable abdominal pain and nausea. No BM. Just waking up and has not been oob this am.  She had multiple NGT placement attempts with significant discomfort and does not know if she is willing to undergo another attempt. Her younger sister passed away earlier this month and she is feeling understandably overwhelmed due to that and physical ailments  Objective: Vital signs in last 24 hours: Temp:  [97.6 F (36.4 C)-98.3 F (36.8 C)] 98 F (36.7 C) (01/25 0448) Pulse Rate:  [86-92] 89 (01/25 0448) Resp:  [15-19] 17 (01/25 0448) BP: (119-146)/(54-77) 134/54 (01/25 0448) SpO2:  [90 %-100 %] 99 % (01/25 0448) Last BM Date: 08/10/21  Intake/Output from previous day: 01/24 0701 - 01/25 0700 In: 1776.5 [P.O.:50; I.V.:1726.5] Out: 425 [Urine:425] Intake/Output this shift: No intake/output data recorded.  PE: General: pleasant, WD, female who is laying in bed in NAD HEENT: head is normocephalic, atraumatic. Mouth is pink and moist Heart: regular, rate, and rhythm.  Palpable radial pulses bilaterally Lungs: Respiratory effort nonlabored Abd: soft, +BS, multiple well healed surgical scars. Mildly distended. Mild to moderate TTP over LLQ MSK: all 4 extremities are symmetrical with no cyanosis, clubbing, or edema. Skin: warm and dry Psych: A&Ox3 with an appropriate affect.    Lab Results:  Recent Labs    08/10/21 1830 08/12/21 0109  WBC 8.3 6.6  HGB 14.9 11.5*  HCT 44.5 35.1*  PLT 278 177   BMET Recent Labs    08/11/21 1843 08/12/21 0109  NA 143 131*  K 3.8 4.1  CL 100 104  CO2 19* 18*  GLUCOSE 74 70  BUN 23 20  CREATININE 1.01* 0.96  CALCIUM 9.3 7.9*   PT/INR No results for input(s): LABPROT, INR in the last 72 hours. CMP     Component Value Date/Time   NA 131 (L) 08/12/2021 0109   NA 141 04/09/2020 0000   K 4.1 08/12/2021 0109   CL 104 08/12/2021 0109   CO2 18 (L) 08/12/2021  0109   GLUCOSE 70 08/12/2021 0109   BUN 20 08/12/2021 0109   BUN 12 04/09/2020 0000   CREATININE 0.96 08/12/2021 0109   CALCIUM 7.9 (L) 08/12/2021 0109   PROT 7.7 08/10/2021 1830   ALBUMIN 4.5 08/10/2021 1830   AST 23 08/10/2021 1830   ALT 7 08/10/2021 1830   ALKPHOS 70 08/10/2021 1830   BILITOT 0.8 08/10/2021 1830   GFRNONAA 57 (L) 08/12/2021 0109   GFRAA 70 04/09/2020 0000   Lipase     Component Value Date/Time   LIPASE 12 08/10/2021 1830       Studies/Results: DG Abd 1 View  Result Date: 08/11/2021 CLINICAL DATA:  NG tube placement EXAM: ABDOMEN - 1 VIEW COMPARISON:  08/10/2021 FINDINGS: NG tube appears to be coiled within the upper thoracic esophagus. There are linear densities in the lower lung fields. IMPRESSION: NG tube appears to be coiled within upper thoracic esophagus. Electronically Signed   By: Elmer Picker M.D.   On: 08/11/2021 18:14   DG Abd 1 View  Result Date: 08/11/2021 CLINICAL DATA:  NG tube placement EXAM: ABDOMEN - 1 VIEW COMPARISON:  Study done earlier today FINDINGS: NG tube appears to be coiled within upper thoracic esophagus. IMPRESSION: NG tube appears to be coiled within upper thoracic esophagus and should be repositioned. Electronically Signed   By: Elmer Picker M.D.   On: 08/11/2021 18:12  CT ABDOMEN PELVIS W CONTRAST  Result Date: 08/10/2021 CLINICAL DATA:  Abdomen pain with nausea vomiting EXAM: CT ABDOMEN AND PELVIS WITH CONTRAST TECHNIQUE: Multidetector CT imaging of the abdomen and pelvis was performed using the standard protocol following bolus administration of intravenous contrast. RADIATION DOSE REDUCTION: This exam was performed according to the departmental dose-optimization program which includes automated exposure control, adjustment of the mA and/or kV according to patient size and/or use of iterative reconstruction technique. CONTRAST:  44mL OMNIPAQUE IOHEXOL 300 MG/ML  SOLN COMPARISON:  CT 07/28/2021 FINDINGS: Lower  chest: Lung bases demonstrate mild subpleural fibrosis. Elevation of right diaphragm. Coronary vascular calcification. No consolidation or effusion. Small hiatal hernia. Hepatobiliary: Multiple hepatic cysts. Status post cholecystectomy. Mild intra and extrahepatic biliary dilatation. Common bile duct dilated up to 8 mm. Additional subcentimeter hypodensities in the liver too small to further characterize Pancreas: Unremarkable. No pancreatic ductal dilatation or surrounding inflammatory changes. Spleen: Normal in size without focal abnormality. Adrenals/Urinary Tract: Adrenal glands are normal. Kidneys show no hydronephrosis. Right renal cysts. Subcentimeter hypodense renal lesions too small to further characterize. The bladder is unremarkable Stomach/Bowel: The stomach is nondistended. There are multiple fluid filled dilated loops of small bowel measuring up to 3.5 cm. Fecalized segment left abdominal small bowel proximal to a surgical anastomosis with transition to completely decompressed small bowel distal to this, coronal series 5, image 56. Patient is status post partial right colectomy with ileocolic anastomosis in the right anterior abdomen. The colon is decompressed. 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 No aneurysm. No suspicious lymph nodes. Reproductive: Status post hysterectomy. No adnexal masses. Other: Negative for pelvic effusion or free air. Musculoskeletal: Left hip replacement with artifact. No acute osseous abnormality. IMPRESSION: 1. Findings consistent with high-grade mechanical small bowel obstruction with transition point visualized at left abdominal surgical small bowel anastomosis. Negative for intramural air or free air. 2. Patient is status post partial colectomy. Diverticular disease of residual left colon without acute wall thickening 3. Status post cholecystectomy. Intra and extrahepatic biliary dilatation  probably due to postsurgical change Electronically Signed   By: Donavan Foil M.D.   On: 08/10/2021 20:02   DG Abd Portable 1V-Small Bowel Protocol-Position Verification  Result Date: 08/11/2021 CLINICAL DATA:  Small-bowel obstruction, NG tube placement EXAM: PORTABLE ABDOMEN - 1 VIEW COMPARISON:  08/10/2021 FINDINGS: Stomach is not distended. There is haziness in the mid and lower abdomen, possibly due to fluid-filled small bowel loops. Enteric tube is not seen in the lower thoracic esophagus or stomach and may be coiled within the patient's throat. Linear densities are seen in the lower lung fields suggesting scarring or subsegmental atelectasis. IMPRESSION: NG tube is not seen in the region of lower thoracic esophagus or stomach. Repositioning of NG tube should be considered. Electronically Signed   By: Elmer Picker M.D.   On: 08/11/2021 18:11   DG Abd Portable 1 View  Result Date: 08/10/2021 CLINICAL DATA:  NG placement. EXAM: PORTABLE ABDOMEN - 1 VIEW COMPARISON:  CT abdomen pelvis dated 08/10/2021. FINDINGS: An enteric tube is not visualized. Support lines noted over the patient. Excreted contrast seen in the renal collecting system. Degenerative changes of the spine. IMPRESSION: No enteric tube seen. Electronically Signed   By: Anner Crete M.D.   On: 08/10/2021 22:05    Anti-infectives: Anti-infectives (From admission, onward)    None        Assessment/Plan  SBO - No current indication for emergency surgery -  multiple failed NGT placements - have ordered placement with fluoroscopy. Patient has not decided whether or not she will agree to proceed. We discussed at length the reasoning and benefits behind my recommendation for NGT placement. She expresses understanding and seems to comprehend our discussion but is hesitant to go through the discomfort again and would like to think about if further. If she agrees with placement discussed with patient and nurse can give prn ativan  prior. If she declines NGT then I recommend SBO protocol with PO contrast - Start SBO protocol PO vs per NGT pending placement - Keep K > 4 and Mg > 2 for bowel function - Mobilize for bowel function - uses a walker at home. Will order PT  - Hopefully patient will improve with conservative management and would like to give patient best chance with NGT/decompression. Her obstruction is at the area of her prior anastomosis so will need to be watched closely. If patient fails to improve with conservative management, discussed she may require exploratory surgery during admission.   FEN: NPO/NGT LIWS ID: none VTE: okay for chemical prophylaxis from surgical standpoint   I reviewed hospitalist notes, last 24 h vitals and pain scores, last 48 h intake and output, last 24 h labs and trends, and last 24 h imaging results.  This care required moderate level of medical decision making.    LOS: 1 day   [facility] 08/12/2021, 7:34 AM Please see Amion for pager number during day hours 7:00am-4:30pm

## 2021-08-12 NOTE — Hospital Course (Signed)
Tina Riley is a 88 y.o. female with medical history significant of GERD, HTN, IBS, frequent UTIs, partial colectomy in 1980s due to "kink" who presented to ED with nausea/vomiting x 36 hours and abdominal pain. pain is in upper quadrants with no radiation. Rated as a 9/10 and described as achy. Pain was constant. Last BM was Sunday and was somewhat normal. It's wasn't runny. Last passed gas yesterday. She has had poor PO intake.  Resistant to NG Tube.  Sister died last week.

## 2021-08-12 NOTE — Progress Notes (Signed)
Mobility Specialist Progress Note:   08/12/21 1200  Mobility  Activity Ambulated with assistance to bathroom  Level of Assistance Minimal assist, patient does 75% or more  Assistive Device Front wheel walker  Distance Ambulated (ft) 30 ft  Activity Response Tolerated well  $Mobility charge 1 Mobility   Pt c/o bilateral feet pain d/t calloses that are baseline. Required minA to stand from EOB, contactG during ambulation for safety. Pt to BR to void, successful. Pt back in bed with bed alarm on.   Nelta Numbers Mobility Specialist  Phone [phone]

## 2021-08-12 NOTE — Progress Notes (Signed)
°  Progress Note   Patient: Tina Riley [id number] DOB: 1931/07/29 DOA: 08/10/2021     1 DOS: the patient was seen and examined on 08/12/2021   Brief hospital course: Tina Riley is a 88 y.o. female with medical history significant of GERD, HTN, IBS, frequent UTIs, partial colectomy in 1980s due to "kink" who presented to ED with nausea/vomiting x 36 hours and abdominal pain. pain is in upper quadrants with no radiation. Rated as a 9/10 and described as achy. Pain was constant. Last BM was Sunday and was somewhat normal. It's wasn't runny. Last passed gas yesterday. She has had poor PO intake.  Resistant to NG Tube.  Sister died last week.    Assessment and Plan * SBO (small bowel obstruction) ([place])- (present on admission) -found to have high grade SBO.likely secondary to adhesions from prior surgery.  -IVF -Pain medication-1mg  morphine q3 hours prn  -Monitor electrolytes -General surgery consulted  -NG tube has been attempted several times without success- patient resistant to more tries but has put it in "Gods hands" -will consult palliative care   Hyponatremia Mild -recheck labs in AM  Abnormal urine- (present on admission) Hx of frequent UTI UA appears infected. She is completely asymptomatic and denies any dysuria, frequency or urgency  Hold tx for asymptomatic infection tx'd for UTI in ED with macrobid on 07/28/21, sensitive on culture   GERD (gastroesophageal reflux disease)- (present on admission) Continue protonix-IV while NPO  Anxiety- (present on admission) On klonopin bid prn Will do ativan IV while NPO  Hyperkalemia- (present on admission) -resolved  Asthma- (present on admission) No signs of exacerbation at this time Continue symbicort and albuterol prn   Hyperlipidemia- (present on admission) Hold crestor while NPO    Subjective: has put the NG tube decision in God's hands   Objective Vitals:   08/11/21 2112 08/12/21 0448 08/12/21  0843 08/12/21 0857  BP: 134/66 (!) 134/54  (!) 147/58  Pulse: 87 89  95  Resp: 16 17  18   Temp: 97.6 F (36.4 C) 98 F (36.7 C)  98.4 F (36.9 C)  TempSrc: Oral   Oral  SpO2: 100% 99% 99% 96%  Weight:      Height:         General: Appearance:    Elderly female in no acute distress     Lungs:     respirations unlabored  Heart:    Normal heart rate.    MS:   All extremities are intact.    Neurologic:   Awake, alert     Data Reviewed: Abdominal x ray pending K WNL Na trending down  Family Communication: nephew not available until after 2PM  Disposition: Status is: Inpatient  Remains inpatient appropriate because: continued management of SBO     Time spent: 35 minutes  Author: Geradine Girt, DO 08/12/2021 12:39 PM  For on call review [email or web address].

## 2021-08-12 NOTE — Evaluation (Signed)
Physical Therapy Evaluation Patient Details Name: Tina Riley MRN: [id number] DOB: 18-Jun-1932 Today's Date: 08/12/2021  History of Present Illness  Pt is a 88 y.o. F who presents 08/10/2021 with small bowel obstruction. NG tube attempted without success. Significant PMH: GERD, HTN, IBS, UTIs, hip replacement.  Clinical Impression  PTA, pt lives alone in a level entry apartment, uses a walker for mobility and is independent with ADL's. Pt presents with decreased endurance and generalized weakness. Ambulating 30 feet with a walker at a min guard assist level. Further distance limited due to pt need to use bathroom. Recommend HHPT at discharge to address deficits.     Recommendations for follow up therapy are one component of a multi-disciplinary discharge planning process, led by the attending physician.  Recommendations may be updated based on patient status, additional functional criteria and insurance authorization.  Follow Up Recommendations Home health PT    Assistance Recommended at Discharge PRN  Patient can return home with the following  A little help with walking and/or transfers;A little help with bathing/dressing/bathroom;Assist for transportation    Equipment Recommendations None recommended by PT  Recommendations for Other Services       Functional Status Assessment Patient has had a recent decline in their functional status and demonstrates the ability to make significant improvements in function in a reasonable and predictable amount of time.     Precautions / Restrictions Precautions Precautions: Fall Restrictions Weight Bearing Restrictions: No      Mobility  Bed Mobility Overal bed mobility: Needs Assistance Bed Mobility: Supine to Sit     Supine to sit: Min assist     General bed mobility comments: use of bed pad to scoot hips    Transfers Overall transfer level: Needs assistance Equipment used: Rolling walker (2 wheels) Transfers: Sit  to/from Stand Sit to Stand: Supervision           General transfer comment: no assist required to rise from toilet    Ambulation/Gait Ambulation/Gait assistance: Min guard Gait Distance (Feet): 30 Feet Assistive device: Rolling walker (2 wheels) Gait Pattern/deviations: Step-through pattern, Decreased stride length Gait velocity: decreased Gait velocity interpretation: <1.8 ft/sec, indicate of risk for recurrent falls   General Gait Details: very slow pace, one posterior LOB in bathroom, requiring min guard assist to correct, but overall supervision  Stairs            Wheelchair Mobility    Modified Rankin (Stroke Patients Only)       Balance Overall balance assessment: Needs assistance Sitting-balance support: Feet supported Sitting balance-Leahy Scale: Good     Standing balance support: Bilateral upper extremity supported, During functional activity, Reliant on assistive device for balance Standing balance-Leahy Scale: Poor                               Pertinent Vitals/Pain Pain Assessment Pain Assessment: Faces Faces Pain Scale: Hurts a little bit Pain Location: lower abdomen Pain Descriptors / Indicators: Discomfort Pain Intervention(s): Monitored during session    Home Living Family/patient expects to be discharged to:: Private residence Living Arrangements: Alone Available Help at Discharge: Family;Available PRN/intermittently Type of Home: Apartment Home Access: Ramped entrance       Home Layout: One level Home Equipment: Grab bars - toilet;Grab bars - tub/shower;Rolling Walker (2 wheels)      Prior Function Prior Level of Function : Needs assist  Mobility Comments: using walker, does not drive ADLs Comments: cleans bathroom and does laundry. family assists with other household chores     Hand Dominance        Extremity/Trunk Assessment   Upper Extremity Assessment Upper Extremity Assessment:  Generalized weakness    Lower Extremity Assessment Lower Extremity Assessment: Generalized weakness    Cervical / Trunk Assessment Cervical / Trunk Assessment: Kyphotic  Communication   Communication: No difficulties  Cognition Arousal/Alertness: Awake/alert Behavior During Therapy: WFL for tasks assessed/performed Overall Cognitive Status: Within Functional Limits for tasks assessed                                          General Comments      Exercises     Assessment/Plan    PT Assessment Patient needs continued PT services  PT Problem List Decreased strength;Decreased activity tolerance;Decreased balance;Decreased mobility       PT Treatment Interventions DME instruction;Gait training;Functional mobility training;Therapeutic activities;Therapeutic exercise;Balance training;Patient/family education    PT Goals (Current goals can be found in the Care Plan section)  Acute Rehab PT Goals Patient Stated Goal: resolve SBO PT Goal Formulation: With patient Time For Goal Achievement: 08/26/21 Potential to Achieve Goals: Good    Frequency Min 3X/week     Co-evaluation               AM-PAC PT "6 Clicks" Mobility  Outcome Measure Help needed turning from your back to your side while in a flat bed without using bedrails?: A Little Help needed moving from lying on your back to sitting on the side of a flat bed without using bedrails?: A Little Help needed moving to and from a bed to a chair (including a wheelchair)?: A Little Help needed standing up from a chair using your arms (e.g., wheelchair or bedside chair)?: A Little Help needed to walk in hospital room?: A Little Help needed climbing 3-5 steps with a railing? : A Lot 6 Click Score: 17    End of Session Equipment Utilized During Treatment: Gait belt Activity Tolerance: Patient tolerated treatment well Patient left: in chair;with call bell/phone within reach;with chair alarm set;with  family/visitor present Nurse Communication: Mobility status;Other (comment) (small BM) PT Visit Diagnosis: Unsteadiness on feet (R26.81);Muscle weakness (generalized) (M62.81)    Time: 3474-2595 PT Time Calculation (min) (ACUTE ONLY): 34 min   Charges:   PT Evaluation $PT Eval Moderate Complexity: 1 Mod PT Treatments $Therapeutic Activity: 8-22 mins        Lillia Pauls, PT, DPT Acute Rehabilitation Services Pager [phone] Office [phone]   Norval Morton 08/12/2021, 5:25 PM

## 2021-08-13 ENCOUNTER — Inpatient Hospital Stay (HOSPITAL_COMMUNITY): Payer: Medicare Other

## 2021-08-13 ENCOUNTER — Other Ambulatory Visit (HOSPITAL_COMMUNITY): Payer: Self-pay

## 2021-08-13 DIAGNOSIS — E782 Mixed hyperlipidemia: Secondary | ICD-10-CM

## 2021-08-13 DIAGNOSIS — Z515 Encounter for palliative care: Secondary | ICD-10-CM

## 2021-08-13 DIAGNOSIS — J4540 Moderate persistent asthma, uncomplicated: Secondary | ICD-10-CM

## 2021-08-13 DIAGNOSIS — K219 Gastro-esophageal reflux disease without esophagitis: Secondary | ICD-10-CM

## 2021-08-13 DIAGNOSIS — Z7189 Other specified counseling: Secondary | ICD-10-CM

## 2021-08-13 DIAGNOSIS — R8290 Unspecified abnormal findings in urine: Secondary | ICD-10-CM

## 2021-08-13 MED ORDER — PROPRANOLOL HCL ER 60 MG PO CP24
60.0000 mg | ORAL_CAPSULE | Freq: Every day | ORAL | Status: DC
  Administered 2021-08-13 – 2021-08-15 (×3): 60 mg via ORAL
  Filled 2021-08-13 (×4): qty 1

## 2021-08-13 NOTE — Progress Notes (Signed)
Mobility Specialist Progress Note:   08/13/21 1100  Mobility  Activity Transferred from bed to chair  Level of Assistance Contact guard assist, steadying assist  Assistive Device Front wheel walker  Distance Ambulated (ft) 10 ft  Activity Response Tolerated well  $Mobility charge 1 Mobility   Pt agreed to sit in chair this morning. Ambulated in room to chair with minG. No physical assist needed throughout session. Pt left in chair with chair alarm on, and all needs met.   Nelta Numbers Mobility Specialist  Phone [phone]

## 2021-08-13 NOTE — Evaluation (Signed)
Occupational Therapy Evaluation Patient Details Name: Tina Riley MRN: [id number] DOB: [birth year] Today's Date: 08/13/2021   History of Present Illness Pt is a 88 y.o. F who presents 08/10/2021 with small bowel obstruction. NG tube attempted without success. Significant PMH: GERD, HTN, IBS, UTIs, hip replacement.   Clinical Impression   Pt walked with a walker and was modified independent in self care, light housekeeping and light meal prep prior to admission. She used a sock aid. Pt presents with generalized weakness and impaired standing balance. She needs set up to moderate assistance (without AD) for ADL and min guard assist with RW for ambulation. Pt is eager to go home so she can go to her sister's funeral on Sunday. Will follow acutely.      Recommendations for follow up therapy are one component of a multi-disciplinary discharge planning process, led by the attending physician.  Recommendations may be updated based on patient status, additional functional criteria and insurance authorization.   Follow Up Recommendations  Home health OT    Assistance Recommended at Discharge Intermittent Supervision/Assistance  Patient can return home with the following A little help with bathing/dressing/bathroom;Assistance with cooking/housework;Assist for transportation    Functional Status Assessment  Patient has had a recent decline in their functional status and demonstrates the ability to make significant improvements in function in a reasonable and predictable amount of time.  Equipment Recommendations  BSC/3in1    Recommendations for Other Services       Precautions / Restrictions Precautions Precautions: Fall Restrictions Weight Bearing Restrictions: No      Mobility Bed Mobility               General bed mobility comments: received in chair    Transfers Overall transfer level: Needs assistance Equipment used: Rolling walker (2 wheels) Transfers: Sit to/from  Stand Sit to Stand: Supervision           General transfer comment: no assist required to rise from chair or toilet      Balance Overall balance assessment: Needs assistance   Sitting balance-Leahy Scale: Good     Standing balance support: Bilateral upper extremity supported, During functional activity, Reliant on assistive device for balance Standing balance-Leahy Scale: Poor Standing balance comment: fair static balance                           ADL either performed or assessed with clinical judgement   ADL Overall ADL's : Needs assistance/impaired Eating/Feeding: Independent;Sitting   Grooming: Brushing hair;Sitting;Set up   Upper Body Bathing: Set up;Sitting   Lower Body Bathing: Minimal assistance;Sit to/from stand   Upper Body Dressing : Set up;Sitting   Lower Body Dressing: Moderate assistance;Sit to/from stand Lower Body Dressing Details (indicate cue type and reason): typically uses a sock aid Toilet Transfer: Min guard;Ambulation;BSC/3in1;Rolling walker (2 wheels)   Toileting- Clothing Manipulation and Hygiene: Minimal assistance;Sit to/from stand       Functional mobility during ADLs: Min guard;Rolling walker (2 wheels)       Vision Baseline Vision/History: 1 Wears glasses Ability to See in Adequate Light: 0 Adequate Patient Visual Report: No change from baseline Additional Comments: reports poor vision, but demonstrated ability to read small print     Perception     Praxis      Pertinent Vitals/Pain Pain Assessment Pain Assessment: No/denies pain     Hand Dominance Right   Extremity/Trunk Assessment Upper Extremity Assessment Upper Extremity Assessment: Overall WFL for tasks  assessed (arthritic changes in hands)   Lower Extremity Assessment Lower Extremity Assessment: Defer to PT evaluation   Cervical / Trunk Assessment Cervical / Trunk Assessment: Kyphotic   Communication Communication Communication: No difficulties    Cognition Arousal/Alertness: Awake/alert Behavior During Therapy: WFL for tasks assessed/performed Overall Cognitive Status: Within Functional Limits for tasks assessed                                       General Comments       Exercises     Shoulder Instructions      Home Living Family/patient expects to be discharged to:: Private residence Living Arrangements: Alone Available Help at Discharge: Family;Available PRN/intermittently Type of Home: Apartment Home Access: Ramped entrance     Home Layout: One level     Bathroom Shower/Tub: Producer, television/film/video: Handicapped height Bathroom Accessibility: Yes   Home Equipment: Grab bars - toilet;Grab bars - tub/shower;Rolling Walker (2 wheels);Shower seat          Prior Functioning/Environment Prior Level of Function : Needs assist             Mobility Comments: using walker, does not drive ADLs Comments: cleans bathroom and does laundry. family assists with other household chores, getting groceries        OT Problem List: Impaired balance (sitting and/or standing);Decreased strength      OT Treatment/Interventions: Self-care/ADL training;Patient/family education;Therapeutic activities    OT Goals(Current goals can be found in the care plan section) Acute Rehab OT Goals OT Goal Formulation: With patient Time For Goal Achievement: 08/27/21 Potential to Achieve Goals: Good ADL Goals Pt Will Perform Grooming: with modified independence;standing Pt Will Perform Lower Body Bathing: with modified independence;sit to/from stand Pt Will Perform Lower Body Dressing: with modified independence;with adaptive equipment;sit to/from stand Pt Will Transfer to Toilet: with modified independence;ambulating;bedside commode (over toilet) Pt Will Perform Toileting - Clothing Manipulation and hygiene: with modified independence;sit to/from stand  OT Frequency: Min 2X/week    Co-evaluation               AM-PAC OT "6 Clicks" Daily Activity     Outcome Measure Help from another person eating meals?: None Help from another person taking care of personal grooming?: A Little Help from another person toileting, which includes using toliet, bedpan, or urinal?: A Little Help from another person bathing (including washing, rinsing, drying)?: A Little Help from another person to put on and taking off regular upper body clothing?: None Help from another person to put on and taking off regular lower body clothing?: A Lot 6 Click Score: 19   End of Session Equipment Utilized During Treatment: Rolling walker (2 wheels);Gait belt  Activity Tolerance: Patient tolerated treatment well Patient left: in chair;with call bell/phone within reach;with chair alarm set  OT Visit Diagnosis: Unsteadiness on feet (R26.81);Other abnormalities of gait and mobility (R26.89);Muscle weakness (generalized) (M62.81)                Time: 4128-7867 OT Time Calculation (min): 25 min Charges:  OT General Charges $OT Visit: 1 Visit OT Evaluation $OT Eval Moderate Complexity: 1 Mod OT Treatments $Self Care/Home Management : 8-22 mins  Martie Round, OTR/L Acute Rehabilitation Services Pager: [phone] Office: [phone]   Evern Bio 08/13/2021, 12:07 PM

## 2021-08-13 NOTE — TOC Initial Note (Addendum)
Transition of Care [facility]) - Initial/Assessment Note    Patient Details  Name: Tina Riley MRN: [id number] Date of Birth: Jun 12, 1932  [facility] CM/SW Contact:    Durenda Guthrie, RN Phone Number: 08/13/2021, 3:13 PM  Clinical Narrative:  Patient is an 88 yr old female admitted with a SBO. Case manager spoke with patient's nephew, Tina Riley [phone], to discuss need for Home Health services. Discussed HH agencies. Referral called to Lorenza Chick, [facility] Liaison. Tina Riley states that he prefers patient not be contacted about start of care until she is home. If there is a need for discussion of Home Health services prior to that he wants to be called. CM will relay this message to [facility]. Patient lives alone and was independent prior to admission, she has and uses a walker. Tina Riley is in frequent contact with her.  TOC Team will continue to follow.               Expected Discharge Plan: Home w Home Health Services Barriers to Discharge: Continued Medical Work up   Patient Goals and CMS Choice     Choice offered to / list presented to :  (Nephew: Tina Riley)  Expected Discharge Plan and Services Expected Discharge Plan: Home w Home Health Services In-house Referral: NA Discharge Planning Services: CM Consult Post Acute Care Choice: Home Health, Durable Medical Equipment Living arrangements for the past 2 months: Apartment                 DME Arranged: 3-N-1 DME Agency: [employer] Date DME Agency Contacted: 08/13/21 Time DME Agency Contacted: [id number] Representative spoke with at DME Agency: Tina Riley HH Arranged: PT, OT,Aide HH Agency: [facility] Date Tina Riley Agency Contacted: 08/13/21 Time HH Agency Contacted: [id number] Representative spoke with at [facility] Agency: Tina Riley  Prior Living Arrangements/Services Living arrangements for the past 2 months: Apartment Lives with:: Self   Do you feel safe going back to the place where you live?: Yes       Need for Family Participation in Patient Care: Yes (Comment) Care giver support system in place?: Yes (comment) Current home services: Home OT, Home PT    Activities of Daily Living Home Assistive Devices/Equipment: Environmental consultant (specify type) ADL Screening (condition at time of admission) Patient's cognitive ability adequate to safely complete daily activities?: Yes Is the patient deaf or have difficulty hearing?: No Does the patient have difficulty seeing, even when wearing glasses/contacts?: No Does the patient have difficulty concentrating, remembering, or making decisions?: No Patient able to express need for assistance with ADLs?: Yes Does the patient have difficulty dressing or bathing?: No Independently performs ADLs?: No Communication: Independent Dressing (OT): Needs assistance Is this a change from baseline?: Change from baseline, expected to last <3days Grooming: Independent Feeding: Independent Bathing: Needs assistance Is this a change from baseline?: Change from baseline, expected to last >3 days Toileting: Needs assistance Is this a change from baseline?: Change from baseline, expected to last >3days In/Out Bed: Needs assistance Is this a change from baseline?: Change from baseline, expected to last >3 days Does the patient have difficulty walking or climbing stairs?: Yes Weakness of Legs: None Weakness of Arms/Hands: None  Permission Sought/Granted                  Emotional Assessment         Alcohol / Substance Use: Not Applicable Psych Involvement: No (comment)  Admission diagnosis:  Small bowel obstruction (HCC) [K56.609] SBO (small  bowel obstruction) (HCC) [K56.609] Patient Active Problem List   Diagnosis Date Noted   Hyponatremia 08/12/2021   Hyperkalemia 08/11/2021   Abnormal urine 08/11/2021   SBO (small bowel obstruction) (HCC) 08/10/2021   Anxiety 02/24/2021   GERD (gastroesophageal reflux disease) 02/24/2021   Hair thinning 12/25/2020    HSV (herpes simplex virus) infection 10/17/2020   Allergic rhinitis 10/17/2020   Hyperlipidemia 10/17/2020   Diarrhea 10/17/2020   Asthma 10/17/2020   Insomnia 10/17/2020   Poor appetite 10/17/2020   Tremor 10/17/2020   PCP:  Myrlene Broker, MD Pharmacy:   [facility] [address] [place] [address] Phone: [phone] Fax: [phone]     Social Determinants of Health (SDOH) Interventions    Readmission Risk Interventions No flowsheet data found.

## 2021-08-13 NOTE — Progress Notes (Signed)
Progress Note     Subjective: Feeling better from yesterday. Abdomen remains sore but pain improved. No further nausea or emesis. Passing flatus and feels like she may be able to have a BM today. Worked well with PT  Objective: Vital signs in last 24 hours: Temp:  [97.5 F (36.4 C)-98.4 F (36.9 C)] 97.9 F (36.6 C) (01/26 0459) Pulse Rate:  [84-95] 84 (01/26 0459) Resp:  [17-18] 18 (01/26 0459) BP: (147-161)/(58-74) 157/74 (01/26 0459) SpO2:  [92 %-99 %] 92 % (01/26 0757) Last BM Date: 08/12/21  Intake/Output from previous day: 01/25 0701 - 01/26 0700 In: 1261.6 [I.V.:1261.6] Out: 600 [Urine:600] Intake/Output this shift: No intake/output data recorded.  PE: General: pleasant, WD, female who is laying in bed in NAD HEENT: head is normocephalic, atraumatic. Mouth is pink and moist Heart: regular, rate, and rhythm.  Palpable radial pulses bilaterally Lungs: Respiratory effort nonlabored Abd: soft, +BS, multiple well healed surgical scars. No distension. Mild TTP over suprapubic region MSK: all 4 extremities are symmetrical with no cyanosis, clubbing, or edema. Skin: warm and dry Psych: A&Ox3 with an appropriate affect.    Lab Results:  Recent Labs    08/10/21 1830 08/12/21 0109  WBC 8.3 6.6  HGB 14.9 11.5*  HCT 44.5 35.1*  PLT 278 177    BMET Recent Labs    08/11/21 1843 08/12/21 0109  NA 143 131*  K 3.8 4.1  CL 100 104  CO2 19* 18*  GLUCOSE 74 70  BUN 23 20  CREATININE 1.01* 0.96  CALCIUM 9.3 7.9*    PT/INR No results for input(s): LABPROT, INR in the last 72 hours. CMP     Component Value Date/Time   NA 131 (L) 08/12/2021 0109   NA 141 04/09/2020 0000   K 4.1 08/12/2021 0109   CL 104 08/12/2021 0109   CO2 18 (L) 08/12/2021 0109   GLUCOSE 70 08/12/2021 0109   BUN 20 08/12/2021 0109   BUN 12 04/09/2020 0000   CREATININE 0.96 08/12/2021 0109   CALCIUM 7.9 (L) 08/12/2021 0109   PROT 7.7 08/10/2021 1830   ALBUMIN 4.5 08/10/2021 1830   AST  23 08/10/2021 1830   ALT 7 08/10/2021 1830   ALKPHOS 70 08/10/2021 1830   BILITOT 0.8 08/10/2021 1830   GFRNONAA 57 (L) 08/12/2021 0109   GFRAA 70 04/09/2020 0000   Lipase     Component Value Date/Time   LIPASE 12 08/10/2021 1830       Studies/Results: DG Abd 1 View  Result Date: 08/11/2021 CLINICAL DATA:  NG tube placement EXAM: ABDOMEN - 1 VIEW COMPARISON:  08/10/2021 FINDINGS: NG tube appears to be coiled within the upper thoracic esophagus. There are linear densities in the lower lung fields. IMPRESSION: NG tube appears to be coiled within upper thoracic esophagus. Electronically Signed   By: Ernie Avena M.D.   On: 08/11/2021 18:14   DG Abd 1 View  Result Date: 08/11/2021 CLINICAL DATA:  NG tube placement EXAM: ABDOMEN - 1 VIEW COMPARISON:  Study done earlier today FINDINGS: NG tube appears to be coiled within upper thoracic esophagus. IMPRESSION: NG tube appears to be coiled within upper thoracic esophagus and should be repositioned. Electronically Signed   By: Ernie Avena M.D.   On: 08/11/2021 18:12   DG Abd Portable 1V  Result Date: 08/12/2021 CLINICAL DATA:  SBO EXAM: PORTABLE ABDOMEN - 1 VIEW COMPARISON:  Abdominal radiograph dated August 11, 2021 FINDINGS: Gas seen in the colon and rectum with trace  amounts of small bowel gas. Prior left total hip arthroplasty. No acute osseous abnormality. IMPRESSION: Gas seen in the colon and rectum with trace amounts of small bowel gas. Electronically Signed   By: Yetta Glassman M.D.   On: 08/12/2021 12:39   DG Abd Portable 1V-Small Bowel Protocol-Position Verification  Result Date: 08/11/2021 CLINICAL DATA:  Small-bowel obstruction, NG tube placement EXAM: PORTABLE ABDOMEN - 1 VIEW COMPARISON:  08/10/2021 FINDINGS: Stomach is not distended. There is haziness in the mid and lower abdomen, possibly due to fluid-filled small bowel loops. Enteric tube is not seen in the lower thoracic esophagus or stomach and may be coiled  within the patient's throat. Linear densities are seen in the lower lung fields suggesting scarring or subsegmental atelectasis. IMPRESSION: NG tube is not seen in the region of lower thoracic esophagus or stomach. Repositioning of NG tube should be considered. Electronically Signed   By: Elmer Picker M.D.   On: 08/11/2021 18:11    Anti-infectives: Anti-infectives (From admission, onward)    None        Assessment/Plan  SBO - No current indication for emergency surgery - multiple failed NGT placements and declined placement with fluoro yesterday - xray abd this am with mildly improved small bowel dilatation. Abdominal exam improved - agree with palliative consult per primary - Keep K > 4 and Mg > 2 for bowel function - Mobilize for bowel function - uses a walker at home. PT reccs HH PT  - seems to be having improvement  Her obstruction is at the area of her prior anastomosis so will need to be watched closely. If patient fails to improve with conservative management, discussed she may require exploratory surgery during admission but again she seems to be improving  FEN: clear liquids ID: none VTE: okay for chemical prophylaxis from surgical standpoint   I reviewed hospitalist notes, last 24 h vitals and pain scores, last 48 h intake and output, last 24 h labs and trends, and last 24 h imaging results.  This care required moderate level of medical decision making.    LOS: 2 days   [facility] 08/13/2021, 8:03 AM Please see Amion for pager number during day hours 7:00am-4:30pm

## 2021-08-13 NOTE — Progress Notes (Signed)
°  Progress Note   Patient: Tina Riley [id number] DOB: Apr 13, 1932 DOA: 08/10/2021     2 DOS: the patient was seen and examined on 08/13/2021   Brief hospital course: Emily Crooker is a 88 y.o. female with medical history significant of GERD, HTN, IBS, frequent UTIs, partial colectomy in 1980s due to "kink" who presented to ED with nausea/vomiting x 36 hours and abdominal pain. pain is in upper quadrants with no radiation. Rated as a 9/10 and described as achy. Pain was constant. Last BM was Sunday and was somewhat normal. It's wasn't runny. Last passed gas yesterday. She has had poor PO intake.  Resistant to NG Tube.  Sister died last week.  Assessment and Plan * SBO (small bowel obstruction) ([place])- (present on admission) -found to have high grade SBO.likely secondary to adhesions from prior surgery.  -IVF -Pain medication prn -Monitor electrolytes with labs in AM -General surgery consulted  -+ flatus and BM overnight-- clear diet -will consult palliative care   Hyponatremia Mild -recheck labs in AM  Abnormal urine- (present on admission) Hx of frequent UTI UA appears infected. She is completely asymptomatic and denies any dysuria, frequency or urgency  Hold tx for asymptomatic infection tx'd for UTI in ED with macrobid on 07/28/21, sensitive on culture   GERD (gastroesophageal reflux disease)- (present on admission) Continue protonix-IV while NPO- if tolerates PO today, will change back to home med  Anxiety- (present on admission) On klonopin bid prn Will do ativan IV- if tolerates PO will change back to home med  Hyperkalemia- (present on admission) -resolved  Asthma- (present on admission) No signs of exacerbation at this time Continue symbicort and albuterol prn   Hyperlipidemia- (present on admission) Hold crestor while NPO     PT/OT- home health  Subjective: had small BM last PM  Objective Vitals:   08/12/21 2028 08/13/21 0459 08/13/21 0757  08/13/21 0843  BP: (!) 154/70 (!) 157/74  (!) 173/71  Pulse: 86 84  89  Resp: 18 18  18   Temp: (!) 97.5 F (36.4 C) 97.9 F (36.6 C)  97.8 F (36.6 C)  TempSrc: Oral Oral  Oral  SpO2: 95% 95% 92% 94%  Weight:      Height:        General: Appearance:     female in no acute distress     Lungs:     respirations unlabored  Heart:    Normal heart rate.    MS:   All extremities are intact.    Neurologic:   Awake, alert     Data Reviewed: Labs ordered for the AM  Family Communication: called MPOA, nephew  Disposition: Status is: Inpatient  Remains inpatient appropriate because: 35 min       Time spent: 35 minutes  Author: Geradine Girt, DO 08/13/2021 1:01 PM  For on call review [email or web address].

## 2021-08-13 NOTE — Progress Notes (Signed)
Pt complain of abdominal tenderness, willing to try NGT insertion, MD was notified but was not needed. Diet has been changed which pt was able to tolerate well. Pt has been out of bed at least 2x. DRN bracelet was placed per order.

## 2021-08-13 NOTE — Progress Notes (Signed)
Mobility Specialist Progress Note:   08/13/21 1400  Mobility  Activity Ambulated with assistance to bathroom  Level of Assistance Standby assist, set-up cues, supervision of patient - no hands on  Assistive Device Front wheel walker  Distance Ambulated (ft) 20 ft  Activity Response Tolerated well  $Mobility charge 1 Mobility   Pt asx during ambulation to BR, small BM successful. Left pt in BR with NT present to wash up.   Addison Lank Mobility Specialist  Phone [phone]

## 2021-08-13 NOTE — Consult Note (Signed)
Palliative Care Consult Note                                  Date: 08/13/2021   Patient Name: Tina Riley  DOB: 03/18/1932  MRN: [id number]  Age / Sex: 88 y.o., female  PCP: Hoyt Koch, MD Referring Physician: Geradine Girt, DO  Reason for Consultation: Establishing goals of care  HPI/Patient Profile: 88 y.o. female  with past medical history of GERD, HTN, IBS, frequent UTIs, partial colectomy in 1980s due to "kink" who presented to ED with nausea/vomiting x 36 hours and abdominal pain. She was admitted on 08/10/2021 with SBO. Attempted NGT x 2 unsuccessfully.  Seems her SBO has begun to resolve spontaneously and she was started on clear liquids.  PMT was consulted for [place] conversations.  Past Medical History:  Diagnosis Date   Arthritis    Asthma    Chicken pox    Depression    Diverticulitis    Genital warts    GERD (gastroesophageal reflux disease)    Hay fever    History of frequent urinary tract infections    Hypertension    IBS (irritable bowel syndrome)    Lactose intolerance    Peripheral neuropathy    Urinary incontinence     Subjective:   This NP Walden Field reviewed medical records, received report from team, assessed the patient and then meet at the patient's bedside to discuss diagnosis, prognosis, GOC, EOL wishes disposition and options.  I met with the patient at the bedside.   Concept of Palliative Care was introduced as specialized medical care for people and their families living with serious illness.  If focuses on providing relief from the symptoms and stress of a serious illness.  The goal is to improve quality of life for both the patient and the family. Values and goals of care important to patient and family were attempted to be elicited.  Created space and opportunity for patient  and family to explore thoughts and feelings regarding current medical situation    Natural trajectory and current clinical status were discussed. Questions and concerns addressed. Patient  encouraged to call with questions or concerns.    Patient/Family Understanding of Illness: The patient that she broke her hip 3 years ago after a fall.  She had another fall about 1 year ago but only ended up bruised.  She now was admitted with "an intestinal blockage".  She states her intestines have been "ugly" for about 20 years since her partial colectomy.  She does note that she feels better today, they have started her on clear liquids and she has been passing significant flatus.  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  She enjoys reading books and watching TV, specifically old [place] [employer] movies and especially the movie "emergency" which she chuckles that it brings some excitement to her life.  Patient Values: Faith, prayer, family  Goals: To continue treatments, avoid resuscitation or extremely invasive procedures such as feeding tube  Today's Discussion: Today we had a good discussion about her current situation.  We discussed small bowel obstruction, the possibility of recurrence, the need for NG tube (although it did seem to resolve without the tube this time).  We discussed that IF she ended up with an obstruction someday that does not resolve then she would  need surgery but this is not something needs to be considered at this point.  We had a discussion about her faith and prayer fullness.  She states that she prays a lot and she prayed to God this morning that "I know Aminomine and I could really use some help" and she feels that he has answered her prayers.  She notes that her sister passed away about a week ago and she became quite tearful about this.  We spent a good amount of time discussing her sister and helping her process her grief.  She relies strongly on her faith to get through  this.  She has agreed to spiritual care consult and states "I think I would like that a lot."  We had an extensive goals of care discussion including completion of a DNR and a MOST form.  I reviewed all the selections on the MOST form with her point by point before she signed.  I told her I would call her nephew Alroy Dust to inform him of our conversation and her decisions.  She asked me not to call him until after 5:00 today because he is teaching.  I informed her that I could try to call him tomorrow and if I get his voicemail I will leave a message.  I provided emotional general support therapeutic listening, empathy, sharing of stories, and other techniques.  Answered all questions and addressed all concerns to the best my ability.  Review of Systems  Constitutional:  Negative for appetite change.  Respiratory:  Negative for cough and shortness of breath.   Gastrointestinal:  Negative for abdominal pain, nausea and vomiting.   Objective:   Primary Diagnoses: Present on Admission:  SBO (small bowel obstruction) (HCC)  Anxiety  GERD (gastroesophageal reflux disease)  Hyperlipidemia  Asthma  Hyperkalemia  Abnormal urine   Physical Exam Vitals and nursing note reviewed.  Constitutional:      General: She is not in acute distress. HENT:     Head: Normocephalic and atraumatic.  Cardiovascular:     Rate and Rhythm: Normal rate.  Pulmonary:     Effort: No respiratory distress.     Breath sounds: No wheezing or rhonchi.  Abdominal:     General: Abdomen is flat.     Palpations: Abdomen is soft.  Skin:    General: Skin is warm and dry.  Neurological:     General: No focal deficit present.     Mental Status: She is alert.  Psychiatric:        Mood and Affect: Mood normal.        Behavior: Behavior normal.    Vital Signs:  BP (!) 173/71 (BP Location: Right Arm)    Pulse 89    Temp 97.8 F (36.6 C) (Oral)    Resp 18    Ht _0  (1.499 m)    Wt 45.4 kg    SpO2 94%    BMI  20.20 kg/m   Palliative Assessment/Data: 50-60%    Advanced Care Planning:   Primary Decision Maker: PATIENT  Code Status/Advance Care Planning: DNR  A discussion was had today regarding advanced directives. Concepts specific to code status, artifical feeding and hydration, continued IV antibiotics and rehospitalization was had.  The difference between a aggressive medical intervention path and a palliative comfort care path for this patient at this time was had. The MOST form was introduced and discussed.The MOST form was completed with the patient and reviewed all selections before she signed. See selections  below or in [facility] ACP tab.   I completed a MOST form today. The patient and family outlined their wishes for the following treatment decisions:  Cardiopulmonary Resuscitation: Do Not Attempt Resuscitation (DNR/No CPR)  Medical Interventions: Limited Additional Interventions: Use medical treatment, IV fluids and cardiac monitoring as indicated, DO NOT USE intubation or mechanical ventilation. May consider use of less invasive airway support such as BiPAP or CPAP. Also provide comfort measures. Transfer to the hospital if indicated. Avoid intensive care.   Antibiotics: Determine use of limitation of antibiotics when infection occurs  IV Fluids: IV fluids if indicated  Feeding Tube: No feeding tube     Decisions/Changes to ACP: Change to DNR See MOST form completed  Assessment & Plan:   Impression: 88 year old female admitted with SBO which seems to have spontaneously resolved.  She is feeling much better.  She is tearful today as she just lost her sister a week ago and is not sure she will be able to make it to the funeral.  Her nephew Alroy Dust is her surviving healthcare power of attorney and she designates him as her surrogate decision-maker should she be unable to make her decisions.  She did elect DNR status and completion of a MOST form (see above or see Vynca).   Anticipate discharge sometime soon with home health.  SUMMARY OF RECOMMENDATIONS   DNR No feeding tube Continue current treatments otherwise Spiritual care consult for grief and spiritual support PMT will contact the patient's nephew tomorrow (not today per her request) to review discussion and decisions that the patient has made PMT will continue to follow  Symptom Management:  Per primary team PMT is available to assist as needed  Prognosis:  Unable to determine  Discharge Planning:  To Be Determined   Discussed with: Medical team, nursing team, patient, [facility] team    Thank you for allowing [place] to participate in the care of Alger Memos Traum PMT will continue to support holistically.  Time Total: 90 min  Greater than 50%  of this time was spent counseling and coordinating care related to the above assessment and plan.  Signed by: Walden Field, NP Palliative Medicine Team  Team Phone # [phone] (Nights/Weekends)  08/13/2021, 2:27 PM

## 2021-08-14 DIAGNOSIS — F419 Anxiety disorder, unspecified: Secondary | ICD-10-CM

## 2021-08-14 DIAGNOSIS — E876 Hypokalemia: Secondary | ICD-10-CM

## 2021-08-14 DIAGNOSIS — Z66 Do not resuscitate: Secondary | ICD-10-CM

## 2021-08-14 DIAGNOSIS — F4321 Adjustment disorder with depressed mood: Secondary | ICD-10-CM

## 2021-08-14 DIAGNOSIS — R4189 Other symptoms and signs involving cognitive functions and awareness: Secondary | ICD-10-CM

## 2021-08-14 LAB — CBC
HCT: 35 % — ABNORMAL LOW (ref 36.0–46.0)
Hemoglobin: 12.3 g/dL (ref 12.0–15.0)
MCH: 35.9 pg — ABNORMAL HIGH (ref 26.0–34.0)
MCHC: 35.1 g/dL (ref 30.0–36.0)
MCV: 102 fL — ABNORMAL HIGH (ref 80.0–100.0)
Platelets: 229 10*3/uL (ref 150–400)
RBC: 3.43 MIL/uL — ABNORMAL LOW (ref 3.87–5.11)
RDW: 11.7 % (ref 11.5–15.5)
WBC: 7.4 10*3/uL (ref 4.0–10.5)
nRBC: 0 % (ref 0.0–0.2)

## 2021-08-14 LAB — BASIC METABOLIC PANEL
Anion gap: 13 (ref 5–15)
BUN: 5 mg/dL — ABNORMAL LOW (ref 8–23)
CO2: 27 mmol/L (ref 22–32)
Calcium: 8.1 mg/dL — ABNORMAL LOW (ref 8.9–10.3)
Chloride: 97 mmol/L — ABNORMAL LOW (ref 98–111)
Creatinine, Ser: 0.9 mg/dL (ref 0.44–1.00)
GFR, Estimated: >60 mL/min (ref >60)
Glucose, Bld: 97 mg/dL (ref 70–99)
Potassium: 3 mmol/L — ABNORMAL LOW (ref 3.5–5.1)
Sodium: 137 mmol/L (ref 135–145)

## 2021-08-14 MED ORDER — ENSURE ENLIVE PO LIQD
237.0000 mL | Freq: Two times a day (BID) | ORAL | Status: DC
  Administered 2021-08-14 – 2021-08-15 (×3): 237 mL via ORAL

## 2021-08-14 MED ORDER — POTASSIUM CHLORIDE 20 MEQ PO PACK
40.0000 meq | PACK | ORAL | Status: AC
  Administered 2021-08-14 (×2): 40 meq via ORAL
  Filled 2021-08-14 (×2): qty 2

## 2021-08-14 MED ORDER — CLONAZEPAM 0.5 MG PO TABS: 0.5000 mg | ORAL_TABLET | Freq: Two times a day (BID) | ORAL | Status: DC | PRN

## 2021-08-14 MED ORDER — HYDRALAZINE HCL 10 MG PO TABS
10.0000 mg | ORAL_TABLET | Freq: Four times a day (QID) | ORAL | Status: DC | PRN
  Administered 2021-08-14: 10 mg via ORAL
  Filled 2021-08-14: qty 1

## 2021-08-14 MED ORDER — VALACYCLOVIR HCL 500 MG PO TABS
1000.0000 mg | ORAL_TABLET | Freq: Every day | ORAL | Status: DC
  Administered 2021-08-14 – 2021-08-15 (×2): 1000 mg via ORAL
  Filled 2021-08-14 (×2): qty 2

## 2021-08-14 MED ORDER — LORATADINE 10 MG PO TABS
10.0000 mg | ORAL_TABLET | Freq: Every day | ORAL | Status: DC
  Filled 2021-08-14: qty 1

## 2021-08-14 MED ORDER — PANTOPRAZOLE SODIUM 40 MG PO TBEC
40.0000 mg | DELAYED_RELEASE_TABLET | Freq: Every day | ORAL | Status: DC
  Administered 2021-08-14: 40 mg via ORAL
  Filled 2021-08-14: qty 1

## 2021-08-14 MED ORDER — LEVOCETIRIZINE DIHYDROCHLORIDE 5 MG PO TABS: 5.0000 mg | ORAL_TABLET | Freq: Every evening | ORAL | Status: DC

## 2021-08-14 MED ORDER — POTASSIUM CHLORIDE 20 MEQ PO PACK
40.0000 meq | PACK | Freq: Once | ORAL | Status: AC
  Administered 2021-08-14: 40 meq via ORAL
  Filled 2021-08-14: qty 2

## 2021-08-14 NOTE — Progress Notes (Signed)
This chaplain responded to PMT consult for emotional support and prayer. The chaplain appreciates EVS-Glenda in communicating the Pt. request for a Bible.  The Pt. invited the chaplain to sit down beside her and maintained a pleasant affect throughout the visit.  The chaplain understands the Pt. faith and reading of scripture provides a place of comfort in the loss of her sister and the Pt. hospital admission.   The chaplain understands it is important to the Pt. to virtually watch her sister's funeral on Sunday. The chaplain learned the Pt. nephew-Mitch is the person the Pt. trusts with her medical and estate decisions.  The chaplain and Pt. read scripture as a place of prayer together.  Chaplain Stephanie Acre [phone]

## 2021-08-14 NOTE — Assessment & Plan Note (Signed)
-  replete aggressively to help with bowel function

## 2021-08-14 NOTE — Progress Notes (Signed)
°  Progress Note   Patient: Tina Riley [id number] DOB: 23-Aug-1931 DOA: 08/10/2021     3 DOS: the patient was seen and examined on 08/14/2021   Brief hospital course: Tina Riley is a 88 y.o. female with medical history significant of GERD, HTN, IBS, frequent UTIs, partial colectomy in 1980s due to "kink" who presented to ED with nausea/vomiting x 36 hours and abdominal pain. pain is in upper quadrants with no radiation. Rated as a 9/10 and described as achy. Pain was constant. Last BM was Sunday and was somewhat normal. It's wasn't runny. Last passed gas yesterday. She has had poor PO intake.  Resistant to NG Tube.  Sister died last week.  Assessment and Plan * SBO (small bowel obstruction) ([place])- (present on admission) -found to have high grade SBO likely secondary to adhesions from prior surgery.  -improving on own -General surgery consulted but now has signed off -full liquid diet- advance to soft in the AM -palliative care: MOST form   Hypokalemia -replete aggressively to help with bowel function  Cognitive change -reported by family -referral to outpatient setting for evaluation   Hyponatremia Mild   Abnormal urine- (present on admission) Hx of frequent UTI UA appears infected. She is completely asymptomatic and denies any dysuria, frequency or urgency  Hold tx for asymptomatic infection tx'd for UTI in ED with macrobid on 07/28/21, sensitive on culture   GERD (gastroesophageal reflux disease)- (present on admission) Continue protonix-IV while NPO- if tolerates PO today, will change back to home med  Anxiety- (present on admission) On klonopin bid prn- change back to PO  Asthma- (present on admission) No signs of exacerbation at this time Continue symbicort and albuterol prn   Hyperlipidemia- (present on admission) Hold crestor while NPO   Hyperkalemia-resolved as of 08/14/2021, (present on admission) -resolved     Subjective: had a bad  night  Objective Vitals:   08/13/21 2030 08/14/21 0518 08/14/21 0724 08/14/21 0817  BP: (!) 152/64 (!) 163/67 (!) 174/69   Pulse: 70 71 65   Resp:   16   Temp: 97.6 F (36.4 C) (!) 97.3 F (36.3 C) 97.8 F (36.6 C)   TempSrc: Oral Oral Oral   SpO2: 97% 94% 96% 98%  Weight:      Height:         Data Reviewed: K low  Family Communication: nephew on phone  Disposition: Status is: Inpatient  Remains inpatient appropriate because: home tom PM if tolerates diet       Time spent: 35 minutes  Author: Geradine Girt, DO 08/14/2021 3:02 PM  For on call review [email or web address].

## 2021-08-14 NOTE — Progress Notes (Signed)
Daily Progress Note   Patient Name: Tina Riley       Date: 08/14/2021 DOB: 1931/09/12  Age: 88 y.o. MRN#: [id number] Attending Physician: Geradine Girt, DO Primary Care Physician: Hoyt Koch, MD Admit Date: 08/10/2021 Length of Stay: 3 days  Reason for Consultation/Follow-up: Establishing goals of care  HPI/Patient Profile:  88 y.o. female  with past medical history of GERD, HTN, IBS, frequent UTIs, partial colectomy in 1980s due to "kink" who presented to ED with nausea/vomiting x 36 hours and abdominal pain. She was admitted on 08/10/2021 with SBO. Attempted NGT x 2 unsuccessfully.  Seems her SBO has begun to resolve spontaneously and she was started on clear liquids.   PMT was consulted for [place] conversations.  Subjective:   Subjective: Chart Reviewed. Updates received. Patient Assessed. Created space and opportunity for patient  and family to explore thoughts and feelings regarding current medical situation.  Today's Discussion: Today met with the patient at the bedside.  She shared that "I had a pity party already this morning" I am she threatened to sign out AMA, was insistent on going home.  However, she says that she is over this.  We discussed that given everything going on with the passing of her sister last week and her acute illness here that it is understandable that she has moments at this point.  She shared that the chaplain was able to come by and speak with her and she very much enjoyed the visit.  We had further discussion about her sister, coping mechanisms, her current clinical status.  She states she feels better today, has been eating liquids and doing well.  No overt abdominal pain but sore on palpation.  No other significant needs.  We discussed plan for discharge when stable, possibly on Saturday versus Monday.  We discussed that her sister's Mardelle Matte is on Sunday and if she is not able to attend in person we can try to arrange set up for review virtually.   She seemed happy about this.  I reviewed her desire for DNR and selections on MOST form (okay with antibiotics case-by-case, okay with IV fluids, DNR, limited scope, no feeding tube).  She confirmed that these are still her wishes.  I spoke with the patient's nephew/HC POA and his wife on the phone.  They said that there is seems to be some confusion as they were under the impression to be discharging Saturday.  I offered to circle back with the hospitalist and case manager to get definitive plans in place.  They requested a decision sooner rather than later being admitted by her primary caregiver to accompany the patient to the funeral and they would need to cancel this if she is going to be in the hospital.  I also expressed some concern about being left home alone with limited support (small support system including nephew and nephew's wife).  There is a question of possible need for private caregivers at least initially after discharge, although there are limited resources.  I spoke with case management who indicated she would not put in a referral for home health aide in addition to home health PT and OT.  PT and OT feel that she is appropriate to be safely discharged to home with outpatient services.  I provided emotional general support through therapeutic listening, empathy, sharing of stories, and other techniques.  I answered all questions and addressed all concerns to the best my ability.  Review of Systems  Respiratory:  Negative  for chest tightness and shortness of breath.   Gastrointestinal:  Negative for abdominal pain, nausea and vomiting.   Objective:   Vital Signs:  BP (!) 174/69 (BP Location: Right Arm)    Pulse 65    Temp 97.8 F (36.6 C) (Oral)    Resp 16    Ht '4\' 11"'  (1.499 m)    Wt 45.4 kg    SpO2 98%    BMI 20.20 kg/m   Physical Exam: Physical Exam Vitals and nursing note reviewed.  Constitutional:      General: She is not in acute distress.    Appearance: She is  ill-appearing.  Cardiovascular:     Rate and Rhythm: Normal rate.  Pulmonary:     Effort: No respiratory distress.     Breath sounds: No wheezing or rhonchi.  Abdominal:     General: Abdomen is flat.     Palpations: Abdomen is soft.     Tenderness: There is abdominal tenderness ("sore").  Skin:    General: Skin is warm and dry.  Neurological:     Mental Status: She is alert.  Psychiatric:        Mood and Affect: Mood normal.        Behavior: Behavior normal.    Palliative Assessment/Data: 50%   Assessment & Plan:   Impression: Present on Admission:  SBO (small bowel obstruction) (HCC)  Anxiety  GERD (gastroesophageal reflux disease)  Hyperlipidemia  Asthma  Hyperkalemia  Abnormal urine  88 year old female admitted with SBO which seems to have spontaneously resolved.  She is feeling much better.  She is tearful today as she just lost her sister a week ago and is not sure she will be able to make it to the funeral.  Her nephew Tina Riley is her surviving healthcare power of attorney and she designates him as her surrogate decision-maker should she be unable to make her decisions.  She did elect DNR status and completion of a MOST form (see above or see Vynca).  Anticipate discharge sometime soon with home health.  SUMMARY OF RECOMMENDATIONS   Continue DNR Full scope of treatments otherwise, excluding feeding tube (see MOST form) PMT will follow peripherally Please notify us if further community needs identified  Symptom Management:  Per primary team PMT is available to assist if needed  Code Status: DNR  Prognosis: Unable to determine  Discharge Planning: Home with Home Health  Discussed with: Medical team, nursing team, [facility] team, patient, patient's family  Thank you for allowing [place] to participate in the care of Tina Riley PMT will continue to support holistically.  Time Total: 75 min  Visit consisted of counseling and education dealing with the  complex and emotionally intense issues of symptom management and palliative care in the setting of serious and potentially life-threatening illness. Greater than 50%  of this time was spent counseling and coordinating care related to the above assessment and plan.  Walden Field, NP Palliative Medicine Team  Team Phone # [phone] (Nights/Weekends)  03/17/2021, 8:17 AM

## 2021-08-14 NOTE — Progress Notes (Signed)
Occupational Therapy Treatment Patient Details Name: Tina Riley MRN: [id number] DOB: 1932-05-03 Today's Date: 08/14/2021   History of present illness Pt is a 88 y.o. F who presents 08/10/2021 with small bowel obstruction. NG tube attempted without success. Significant PMH: GERD, HTN, IBS, UTIs, hip replacement.   OT comments  Pt anxious today. Reports having slept poorly and threatened to leave AMA. Assisted OOB to go to bathroom, but pt with urinary incontinence upon standing, transferred to [facility]. Set up for pericare, moderate assistance for LB dressing, set up to change wet gown and to wash her hands. Pt choosing to return to bed in hopes of getting some sleep.    Recommendations for follow up therapy are one component of a multi-disciplinary discharge planning process, led by the attending physician.  Recommendations may be updated based on patient status, additional functional criteria and insurance authorization.    Follow Up Recommendations  Home health OT    Assistance Recommended at Discharge Intermittent Supervision/Assistance  Patient can return home with the following  A little help with bathing/dressing/bathroom;Assistance with cooking/housework;Assist for transportation   Equipment Recommendations  BSC/3in1    Recommendations for Other Services      Precautions / Restrictions Precautions Precautions: Fall       Mobility Bed Mobility Overal bed mobility: Needs Assistance Bed Mobility: Supine to Sit, Sit to Supine     Supine to sit: Min assist Sit to supine: Supervision   General bed mobility comments: pulled trunk up using therapist's hand    Transfers Overall transfer level: Needs assistance Equipment used: Rolling walker (2 wheels) Transfers: Sit to/from Stand Sit to Stand: Min guard           General transfer comment: slow to rise, anxious about falling today     Balance Overall balance assessment: Needs assistance   Sitting  balance-Leahy Scale: Good     Standing balance support: Bilateral upper extremity supported, During functional activity, Reliant on assistive device for balance Standing balance-Leahy Scale: Poor Standing balance comment: more reluctant to release walker to manage panties                           ADL either performed or assessed with clinical judgement   ADL Overall ADL's : Needs assistance/impaired     Grooming: Wash/dry hands;Sitting;Set up           Upper Body Dressing : Set up;Sitting   Lower Body Dressing: Moderate assistance;Sit to/from stand Lower Body Dressing Details (indicate cue type and reason): mesh panties over feet Toilet Transfer: Min guard;Ambulation;BSC/3in1;Rolling walker (2 wheels)   Toileting- Clothing Manipulation and Hygiene: Minimal assistance;Sit to/from stand;Sitting/lateral lean Toileting - Clothing Manipulation Details (indicate cue type and reason): min assist to manage mesh panties     Functional mobility during ADLs: Rolling walker (2 wheels);Min guard      Extremity/Trunk Assessment              Vision       Perception     Praxis      Cognition Arousal/Alertness: Awake/alert Behavior During Therapy: Anxious Overall Cognitive Status: Within Functional Limits for tasks assessed                                          Exercises      Shoulder Instructions       General  Comments      Pertinent Vitals/ Pain       Pain Assessment Pain Assessment: No/denies pain  Home Living                                          Prior Functioning/Environment              Frequency  Min 2X/week        Progress Toward Goals  OT Goals(current goals can now be found in the care plan section)  Progress towards OT goals: Progressing toward goals  Acute Rehab OT Goals OT Goal Formulation: With patient Time For Goal Achievement: 08/27/21 Potential to Achieve Goals: Good  Plan  Discharge plan remains appropriate    Co-evaluation                 AM-PAC OT "6 Clicks" Daily Activity     Outcome Measure   Help from another person eating meals?: None Help from another person taking care of personal grooming?: A Little Help from another person toileting, which includes using toliet, bedpan, or urinal?: A Little Help from another person bathing (including washing, rinsing, drying)?: A Little Help from another person to put on and taking off regular upper body clothing?: A Little Help from another person to put on and taking off regular lower body clothing?: A Lot 6 Click Score: 18    End of Session Equipment Utilized During Treatment: Rolling walker (2 wheels)  OT Visit Diagnosis: Unsteadiness on feet (R26.81);Other abnormalities of gait and mobility (R26.89);Muscle weakness (generalized) (M62.81)   Activity Tolerance Patient limited by fatigue   Patient Left in bed;with call bell/phone within reach;with bed alarm set   Nurse Communication          Time: 5726-2035 OT Time Calculation (min): 35 min  Charges: OT General Charges $OT Visit: 1 Visit OT Treatments $Self Care/Home Management : 23-37 mins  Tina Riley, OTR/L Acute Rehabilitation Services Pager: [phone] Office: [phone]  Tina Riley 08/14/2021, 1:02 PM

## 2021-08-14 NOTE — Assessment & Plan Note (Signed)
-  reported by family -referral to outpatient setting for evaluation

## 2021-08-14 NOTE — Care Management Important Message (Signed)
Important Message  Patient Details  Name: Tina Riley MRN: [id number] Date of Birth: October 11, 1931   Medicare Important Message Given:  Yes     Sherilyn Banker 08/14/2021, 12:41 PM

## 2021-08-14 NOTE — Progress Notes (Signed)
Physical Therapy Treatment Patient Details Name: Tina Riley MRN: [id number] DOB: Apr 19, 1932 Today's Date: 08/14/2021   History of Present Illness Pt is a 88 y.o. F who presents 08/10/2021 with small bowel obstruction. NG tube attempted without success. Significant PMH: GERD, HTN, IBS, UTIs, hip replacement.    PT Comments    Pt received in supine, agreeable to therapy session and with good participation and tolerance for transfer and gait progression. Pt reliant on RW and needing up to min guard for safety with transfers and gait. Pt denies abdominal discomfort. Pt continues to benefit from PT services to progress toward functional mobility goals. Continue to recommend HHPT, will plan to progress gait distance for building pt confidence and independence next session.   Recommendations for follow up therapy are one component of a multi-disciplinary discharge planning process, led by the attending physician.  Recommendations may be updated based on patient status, additional functional criteria and insurance authorization.  Follow Up Recommendations  Home health PT     Assistance Recommended at Discharge PRN  Patient can return home with the following A little help with walking and/or transfers;A little help with bathing/dressing/bathroom;Assist for transportation;Help with stairs or ramp for entrance   Equipment Recommendations  None recommended by PT (BSC already delivered to room)    Recommendations for Other Services       Precautions / Restrictions Precautions Precautions: Fall Restrictions Weight Bearing Restrictions: No     Mobility  Bed Mobility Overal bed mobility: Needs Assistance Bed Mobility: Supine to Sit, Sit to Supine     Supine to sit: Min assist     General bed mobility comments: pulled trunk up using bed rail; HOB elevated >30 deg    Transfers Overall transfer level: Needs assistance Equipment used: Rolling walker (2 wheels) Transfers: Sit  to/from Stand Sit to Stand: Min guard           General transfer comment: cues for safe hand placement, increased time to perform    Ambulation/Gait Ambulation/Gait assistance: Min guard Gait Distance (Feet): 80 Feet Assistive device: Rolling walker (2 wheels) Gait Pattern/deviations: Step-through pattern, Decreased stride length Gait velocity: decreased Gait velocity interpretation: <1.31 ft/sec, indicative of household ambulator   General Gait Details: up to min guard for RW use and mod postural cues and for longer step length for energy conservation      Balance Overall balance assessment: Needs assistance   Sitting balance-Leahy Scale: Good     Standing balance support: Bilateral upper extremity supported, During functional activity, Reliant on assistive device for balance Standing balance-Leahy Scale: Poor Standing balance comment: reliant on BUE support of RW            Cognition Arousal/Alertness: Awake/alert Behavior During Therapy: WFL for tasks assessed/performed Overall Cognitive Status: Within Functional Limits for tasks assessed         General Comments: pt calm/cooperative today, agreeable to mobility progression with encouragement. Pt spoke about her emotions since recent death of her sister in past week but participatory.        Exercises      General Comments General comments (skin integrity, edema, etc.): no acute s/sx distress and pt denies overt dizziness with transfers      Pertinent Vitals/Pain Pain Assessment Pain Assessment: No/denies pain     PT Goals (current goals can now be found in the care plan section) Acute Rehab PT Goals Patient Stated Goal: resolve SBO PT Goal Formulation: With patient Time For Goal Achievement: 08/26/21 Progress towards PT  goals: Progressing toward goals    Frequency    Min 3X/week      PT Plan Current plan remains appropriate       AM-PAC PT "6 Clicks" Mobility   Outcome Measure   Help needed turning from your back to your side while in a flat bed without using bedrails?: A Little Help needed moving from lying on your back to sitting on the side of a flat bed without using bedrails?: A Little Help needed moving to and from a bed to a chair (including a wheelchair)?: A Little Help needed standing up from a chair using your arms (e.g., wheelchair or bedside chair)?: A Little Help needed to walk in hospital room?: A Little Help needed climbing 3-5 steps with a railing? : A Lot 6 Click Score: 17    End of Session Equipment Utilized During Treatment: Gait belt Activity Tolerance: Patient tolerated treatment well Patient left: in chair;with call bell/phone within reach;with chair alarm set (pt assisted to set up lunch tray in front of recliner) Nurse Communication: Mobility status PT Visit Diagnosis: Unsteadiness on feet (R26.81);Muscle weakness (generalized) (M62.81)     Time: 1311-1330 PT Time Calculation (min) (ACUTE ONLY): 19 min  Charges:  $Gait Training: 8-22 mins                     Tina Riley P., PTA Acute Rehabilitation Services Pager: [phone] Office: [phone]    Tina Riley 08/14/2021, 1:45 PM

## 2021-08-14 NOTE — Progress Notes (Addendum)
Progress Note     Subjective: Tolerating clears. BM yesterday and this am. Does have low appetite but no nausea, emesis, worsening abdominal pain. Feeling disgruntled this am and talking about leaving AMA but feels better after our visit  Objective: Vital signs in last 24 hours: Temp:  [97.3 F (36.3 C)-97.9 F (36.6 C)] 97.8 F (36.6 C) (01/27 0724) Pulse Rate:  [65-89] 65 (01/27 0724) Resp:  [16-19] 16 (01/27 0724) BP: (152-174)/(64-80) 174/69 (01/27 0724) SpO2:  [94 %-98 %] 96 % (01/27 0724) Last BM Date: 08/12/21  Intake/Output from previous day: 01/26 0701 - 01/27 0700 In: 3218.3 [P.O.:120; I.V.:3098.3] Out: 1200 [Urine:1200] Intake/Output this shift: No intake/output data recorded.  PE: General: pleasant, WD, female who is laying in bed in NAD HEENT: head is normocephalic, atraumatic. Mouth is pink and moist Heart: regular, rate, and rhythm.  Palpable radial pulses bilaterally Lungs: Respiratory effort nonlabored Abd: soft, +BS, multiple well healed surgical scars. No distension. No ttp MSK: all 4 extremities are symmetrical with no cyanosis, clubbing, or edema. Skin: warm and dry Psych: A&Ox3 with an appropriate affect.    Lab Results:  Recent Labs    08/12/21 0109 08/14/21 0222  WBC 6.6 7.4  HGB 11.5* 12.3  HCT 35.1* 35.0*  PLT 177 229    BMET Recent Labs    08/12/21 0109 08/14/21 0222  NA 131* 137  K 4.1 3.0*  CL 104 97*  CO2 18* 27  GLUCOSE 70 97  BUN 20 5*  CREATININE 0.96 0.90  CALCIUM 7.9* 8.1*    PT/INR No results for input(s): LABPROT, INR in the last 72 hours. CMP     Component Value Date/Time   NA 137 08/14/2021 0222   NA 141 04/09/2020 0000   K 3.0 (L) 08/14/2021 0222   CL 97 (L) 08/14/2021 0222   CO2 27 08/14/2021 0222   GLUCOSE 97 08/14/2021 0222   BUN 5 (L) 08/14/2021 0222   BUN 12 04/09/2020 0000   CREATININE 0.90 08/14/2021 0222   CALCIUM 8.1 (L) 08/14/2021 0222   PROT 7.7 08/10/2021 1830   ALBUMIN 4.5 08/10/2021  1830   AST 23 08/10/2021 1830   ALT 7 08/10/2021 1830   ALKPHOS 70 08/10/2021 1830   BILITOT 0.8 08/10/2021 1830   GFRNONAA >60 08/14/2021 0222   GFRAA 70 04/09/2020 0000   Lipase     Component Value Date/Time   LIPASE 12 08/10/2021 1830       Studies/Results: DG Abd Portable 1V  Result Date: 08/13/2021 CLINICAL DATA:  Small bowel obstruction EXAM: PORTABLE ABDOMEN - 1 VIEW COMPARISON:  Radiographs 08/12/2021 FINDINGS: Bowel staple lines noted in the right and left abdomen; on the CT of 08/10/2021 the site of transition from dilated to nondilated small bowel was of the small bowel anastomotic staple line in the left abdomen. On today's exam with identify formed stool in the colon along with mildly dilated loops of small bowel in the right abdomen measuring 3.7 cm (formerly 4.2 cm). Left hip prosthesis noted. IMPRESSION: 1. Mildly improved small bowel dilatation, a mildly dilated loop of small bowel in the right abdomen currently measures 3.7 cm in diameter (formerly 4.2 cm). Electronically Signed   By: Van Clines M.D.   On: 08/13/2021 08:57   DG Abd Portable 1V  Result Date: 08/12/2021 CLINICAL DATA:  SBO EXAM: PORTABLE ABDOMEN - 1 VIEW COMPARISON:  Abdominal radiograph dated August 11, 2021 FINDINGS: Gas seen in the colon and rectum with trace amounts of  small bowel gas. Prior left total hip arthroplasty. No acute osseous abnormality. IMPRESSION: Gas seen in the colon and rectum with trace amounts of small bowel gas. Electronically Signed   By: Yetta Glassman M.D.   On: 08/12/2021 12:39    Anti-infectives: Anti-infectives (From admission, onward)    None        Assessment/Plan  SBO - No indication for emergency surgery - xray abd 1/26 with mildly improved small bowel dilatation. Abdominal exam remains improved - tolerating clears with low appetite. Multiple BMs - agree with palliative consult per primary - Keep K > 4 and Mg > 2 for bowel function - Mobilize for  bowel function - uses a walker at home. PT reccs HH PT  Advance to full liquids and ADAT soft. Stable for dc as early as this afternoon from surgical standpoint if she is tolerating fulls - can go home on full liquids  FEN: FLD ADAT soft, ensure ID: none VTE: okay for chemical prophylaxis from surgical standpoint   I reviewed Consultant Palliative care notes, hospitalist notes, last 24 h vitals and pain scores, last 48 h intake and output, and last 24 h labs and trends.  This care required low level of medical decision making.    LOS: 3 days   [facility] 08/14/2021, 8:01 AM Please see Amion for pager number during day hours 7:00am-4:30pm

## 2021-08-15 LAB — BASIC METABOLIC PANEL
Anion gap: 12 (ref 5–15)
Anion gap: 10 (ref 5–15)
BUN: 6 mg/dL — ABNORMAL LOW (ref 8–23)
BUN: 9 mg/dL (ref 8–23)
CO2: 25 mmol/L (ref 22–32)
CO2: 24 mmol/L (ref 22–32)
Calcium: 8.2 mg/dL — ABNORMAL LOW (ref 8.9–10.3)
Calcium: 8.4 mg/dL — ABNORMAL LOW (ref 8.9–10.3)
Chloride: 99 mmol/L (ref 98–111)
Chloride: 98 mmol/L (ref 98–111)
Creatinine, Ser: 0.8 mg/dL (ref 0.44–1.00)
Creatinine, Ser: 0.83 mg/dL (ref 0.44–1.00)
GFR, Estimated: >60 mL/min (ref >60)
GFR, Estimated: >60 mL/min (ref >60)
Glucose, Bld: 112 mg/dL — ABNORMAL HIGH (ref 70–99)
Glucose, Bld: 242 mg/dL — ABNORMAL HIGH (ref 70–99)
Potassium: 3 mmol/L — ABNORMAL LOW (ref 3.5–5.1)
Potassium: 3.6 mmol/L (ref 3.5–5.1)
Sodium: 136 mmol/L (ref 135–145)
Sodium: 132 mmol/L — ABNORMAL LOW (ref 135–145)

## 2021-08-15 LAB — MAGNESIUM: Magnesium: 1.4 mg/dL — ABNORMAL LOW (ref 1.7–2.4)

## 2021-08-15 MED ORDER — POTASSIUM CHLORIDE 20 MEQ PO PACK: 40.0000 meq | PACK | Freq: Once | ORAL | Status: DC

## 2021-08-15 MED ORDER — MAGNESIUM SULFATE 2 GM/50ML IV SOLN
2.0000 g | Freq: Once | INTRAVENOUS | Status: AC
  Administered 2021-08-15: 2 g via INTRAVENOUS
  Filled 2021-08-15: qty 50

## 2021-08-15 MED ORDER — ENSURE ENLIVE PO LIQD: 237.0000 mL | Freq: Two times a day (BID) | ORAL | 12 refills | Status: DC

## 2021-08-15 MED ORDER — GABAPENTIN 100 MG PO CAPS: 300.0000 mg | ORAL_CAPSULE | Freq: Every day | ORAL | Status: DC

## 2021-08-15 MED ORDER — POTASSIUM CHLORIDE CRYS ER 20 MEQ PO TBCR
40.0000 meq | EXTENDED_RELEASE_TABLET | Freq: Once | ORAL | Status: AC
  Administered 2021-08-15: 40 meq via ORAL
  Filled 2021-08-15: qty 2

## 2021-08-15 MED ORDER — POTASSIUM CHLORIDE 10 MEQ/100ML IV SOLN
10.0000 meq | INTRAVENOUS | Status: AC
  Administered 2021-08-15 (×4): 10 meq via INTRAVENOUS
  Filled 2021-08-15 (×4): qty 100

## 2021-08-15 MED ORDER — ACETAMINOPHEN 325 MG PO TABS
650.0000 mg | ORAL_TABLET | Freq: Four times a day (QID) | ORAL | Status: DC | PRN
  Administered 2021-08-15: 650 mg via ORAL
  Filled 2021-08-15: qty 2

## 2021-08-15 NOTE — Progress Notes (Signed)
Mobility Specialist Progress Note ° ° 08/15/21 1200  °Mobility  °Activity Dangled on edge of bed  °Level of Assistance Moderate assist, patient does 50-74%  °Assistive Device  °(HHA)  °Activity Response Tolerated fair  °$Mobility charge 1 Mobility  ° °Pt in sadden state d/t sister's funeral being today and tomorrow, also pt was having slight headaches this morning. Deferring any ambulation this afternoon but agreeable to sitting EOB. MinA to support trunk up and modA to get hips to EOB via padding, assistance was d/t pt's fear of falling. Mod encouragement for self effort. After sitting up for ~5mins pt complained of being hot and was returned BTB w/ needs met and call bell in reach.   ° °Jeremiah Hedrick °Mobility Specialist °Phone Number [phone] ° °

## 2021-08-15 NOTE — Progress Notes (Signed)
Occupational Therapy Treatment Patient Details Name: Tina Riley MRN: [id number] DOB: 1932/01/24 Today's Date: 08/15/2021   History of present illness Pt is a 88 y.o. F who presents 08/10/2021 with small bowel obstruction. NG tube attempted without success. Significant PMH: GERD, HTN, IBS, UTIs, hip replacement.   OT comments  Assisted pt to bathroom and to groom at sink with min guard assist and RW. Encouraged pt to walk in hall with OT, but she declined, reports she walked with RN earlier.    Recommendations for follow up therapy are one component of a multi-disciplinary discharge planning process, led by the attending physician.  Recommendations may be updated based on patient status, additional functional criteria and insurance authorization.    Follow Up Recommendations  Home health OT    Assistance Recommended at Discharge Intermittent Supervision/Assistance  Patient can return home with the following  A little help with bathing/dressing/bathroom;Assistance with cooking/housework;Assist for transportation   Equipment Recommendations  BSC/3in1    Recommendations for Other Services      Precautions / Restrictions Precautions Precautions: Fall       Mobility Bed Mobility               General bed mobility comments: in chair    Transfers Overall transfer level: Needs assistance Equipment used: Rolling walker (2 wheels) Transfers: Sit to/from Stand Sit to Stand: Min guard                 Balance Overall balance assessment: Needs assistance   Sitting balance-Leahy Scale: Good     Standing balance support: Bilateral upper extremity supported, During functional activity, Reliant on assistive device for balance Standing balance-Leahy Scale: Poor Standing balance comment: reliant on BUE support of RW                           ADL either performed or assessed with clinical judgement   ADL Overall ADL's : Needs assistance/impaired      Grooming: Wash/dry hands;Standing;Min guard           Upper Body Dressing : Set up;Sitting       Toilet Transfer: Min guard;Ambulation;Rolling walker (2 wheels);Comfort height toilet;Grab bars   Toileting- Clothing Manipulation and Hygiene: Min guard;Sit to/from stand       Functional mobility during ADLs: Min guard;Rolling walker (2 wheels)      Extremity/Trunk Assessment              Vision       Perception     Praxis      Cognition Arousal/Alertness: Awake/alert Behavior During Therapy: WFL for tasks assessed/performed Overall Cognitive Status: Within Functional Limits for tasks assessed                                          Exercises      Shoulder Instructions       General Comments      Pertinent Vitals/ Pain       Pain Assessment Pain Assessment: No/denies pain  Home Living                                          Prior Functioning/Environment              Frequency  Min 2X/week        Progress Toward Goals  OT Goals(current goals can now be found in the care plan section)  Progress towards OT goals: Progressing toward goals  Acute Rehab OT Goals OT Goal Formulation: With patient Time For Goal Achievement: 08/27/21 Potential to Achieve Goals: Good  Plan Discharge plan remains appropriate    Co-evaluation                 AM-PAC OT "6 Clicks" Daily Activity     Outcome Measure   Help from another person eating meals?: None Help from another person taking care of personal grooming?: A Little Help from another person toileting, which includes using toliet, bedpan, or urinal?: A Little Help from another person bathing (including washing, rinsing, drying)?: A Little Help from another person to put on and taking off regular upper body clothing?: A Little Help from another person to put on and taking off regular lower body clothing?: A Lot 6 Click Score: 18    End of Session  Equipment Utilized During Treatment: Rolling walker (2 wheels)  OT Visit Diagnosis: Unsteadiness on feet (R26.81);Other abnormalities of gait and mobility (R26.89);Muscle weakness (generalized) (M62.81)   Activity Tolerance Patient tolerated treatment well   Patient Left in chair;with call bell/phone within reach;with chair alarm set   Nurse Communication          Time: 1500-1520 OT Time Calculation (min): 20 min  Charges: OT General Charges $OT Visit: 1 Visit OT Treatments $Self Care/Home Management : 8-22 mins  Martie Round, OTR/L Acute Rehabilitation Services Pager: [phone] Office: [phone]   Evern Bio 08/15/2021, 3:55 PM

## 2021-08-15 NOTE — Plan of Care (Signed)
  Problem: Clinical Measurements: Goal: Ability to maintain clinical measurements within normal limits will improve Outcome: Not Progressing   Problem: Elimination: Goal: Will not experience complications related to bowel motility Outcome: Not Progressing   

## 2021-08-15 NOTE — Discharge Summary (Signed)
Physician Discharge Summary  Tina Riley [id number] DOB: 02/22/1932 DOA: 08/10/2021  PCP: Hoyt Koch, MD  Admit date: 08/10/2021 Discharge date: 08/15/2021  Admitted From: home  Discharge disposition: home with home health   Recommendations for Outpatient Follow-Up:   BMP 1 week MOST FORM DONE with palliative care-- under ACP   Discharge Diagnosis:   Principal Problem:   SBO (small bowel obstruction) ([place]) Active Problems:   Hypokalemia   Anxiety   GERD (gastroesophageal reflux disease)   Abnormal urine   Hyponatremia   Cognitive change   Hyperlipidemia   Asthma    Discharge Condition: Improved.  Diet recommendation: as tolerated- slowly add diet  Wound care: None.  Code status: DNR   History of Present Illness:   Tina Riley is a 88 y.o. female with medical history significant of GERD, HTN, IBS, frequent UTIs, partial colectomy in 1980s due to "kink" who presented to ED with nausea/vomiting x 36 hours and abdominal pain. pain is in upper quadrants with no radiation. Rated as a 9/10 and described as achy. Pain was constant. Last BM was Sunday and was somewhat normal. It's wasn't runny. Last passed gas yesterday. She has had poor PO intake. She had had ongoing diarrhea for months and is being followed by GI for this.    Surgical history of partial colectomy in th 1980s, appendectomy, hysterectomy and cholecystectomy. Last colonoscopy in the 1980s prior to colectomy.    Indendepent with all of her ADLs, does not drive. Family brings groceries.    Denies any fever/chills, vision changes, chest pain/palpitations, shortness of breath, cough, dysuria/urgency/frequency, leg swelling, rashes. Denies any blood in stool or vomit.      Hospital Course by Problem:   * SBO (small bowel obstruction) (HCC)- (present on admission) -found to have high grade SBO likely secondary to adhesions from prior surgery.  -improving on own-- never  got NG tube -General surgery consulted but now has signed off -diet advanced to soft and patient tolerating well -plan to d/c home tonight so patient can attend her sister's funeral in the AM   Hypokalemia/hypomagnesmia -repleted aggressively    Cognitive change -reported by family -referral to outpatient setting for evaluation  -discussed with palliative care NP and currently patient has the ability to make her decisions   Hyponatremia Mild     Abnormal urine- (present on admission) Hx of frequent UTI UA appears infected. She is completely asymptomatic and denies any dysuria, frequency or urgency  Hold tx for asymptomatic infection tx'd for UTI in ED with macrobid on 07/28/21, sensitive on culture    GERD (gastroesophageal reflux disease)- (present on admission) Protonix PO   Anxiety- (present on admission) On klonopin bid prn   Asthma- (present on admission) No signs of exacerbation at this time Continue symbicort and albuterol prn    Hyperlipidemia- (present on admission) -resume home meds      Medical Consultants:      Discharge Exam:   Vitals:   08/15/21 0423 08/15/21 0857  BP: (!) 153/67 (!) 169/77  Pulse: 67 66  Resp: 16 18  Temp: (!) 97.5 F (36.4 C) 98.8 F (37.1 C)  SpO2: 98% 99%   Vitals:   08/14/21 1558 08/14/21 2105 08/15/21 0423 08/15/21 0857  BP: 140/64 (!) 173/74 (!) 153/67 (!) 169/77  Pulse: 65 67 67 66  Resp: 16 17 16 18   Temp: 97.8 F (36.6 C) 97.9 F (36.6 C) (!) 97.5 F (36.4 C) 98.8 F (  37.1 C)  TempSrc: Oral Oral    SpO2: 98% 98% 98% 99%  Weight:      Height:        General exam: Appears calm and comfortable.   The results of significant diagnostics from this hospitalization (including imaging, microbiology, ancillary and laboratory) are listed below for reference.     Procedures and Diagnostic Studies:   DG Abd 1 View  Result Date: 08/11/2021 CLINICAL DATA:  NG tube placement EXAM: ABDOMEN - 1 VIEW COMPARISON:   08/10/2021 FINDINGS: NG tube appears to be coiled within the upper thoracic esophagus. There are linear densities in the lower lung fields. IMPRESSION: NG tube appears to be coiled within upper thoracic esophagus. Electronically Signed   By: Elmer Picker M.D.   On: 08/11/2021 18:14   DG Abd 1 View  Result Date: 08/11/2021 CLINICAL DATA:  NG tube placement EXAM: ABDOMEN - 1 VIEW COMPARISON:  Study done earlier today FINDINGS: NG tube appears to be coiled within upper thoracic esophagus. IMPRESSION: NG tube appears to be coiled within upper thoracic esophagus and should be repositioned. Electronically Signed   By: Elmer Picker M.D.   On: 08/11/2021 18:12   CT ABDOMEN PELVIS W CONTRAST  Result Date: 08/10/2021 CLINICAL DATA:  Abdomen pain with nausea vomiting EXAM: CT ABDOMEN AND PELVIS WITH CONTRAST TECHNIQUE: Multidetector CT imaging of the abdomen and pelvis was performed using the standard protocol following bolus administration of intravenous contrast. RADIATION DOSE REDUCTION: This exam was performed according to the departmental dose-optimization program which includes automated exposure control, adjustment of the mA and/or kV according to patient size and/or use of iterative reconstruction technique. CONTRAST:  78mL OMNIPAQUE IOHEXOL 300 MG/ML  SOLN COMPARISON:  CT 07/28/2021 FINDINGS: Lower chest: Lung bases demonstrate mild subpleural fibrosis. Elevation of right diaphragm. Coronary vascular calcification. No consolidation or effusion. Small hiatal hernia. Hepatobiliary: Multiple hepatic cysts. Status post cholecystectomy. Mild intra and extrahepatic biliary dilatation. Common bile duct dilated up to 8 mm. Additional subcentimeter hypodensities in the liver too small to further characterize Pancreas: Unremarkable. No pancreatic ductal dilatation or surrounding inflammatory changes. Spleen: Normal in size without focal abnormality. Adrenals/Urinary Tract: Adrenal glands are normal.  Kidneys show no hydronephrosis. Right renal cysts. Subcentimeter hypodense renal lesions too small to further characterize. The bladder is unremarkable Stomach/Bowel: The stomach is nondistended. There are multiple fluid filled dilated loops of small bowel measuring up to 3.5 cm. Fecalized segment left abdominal small bowel proximal to a surgical anastomosis with transition to completely decompressed small bowel distal to this, coronal series 5, image 56. Patient is status post partial right colectomy with ileocolic anastomosis in the right anterior abdomen. The colon is decompressed. 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 No aneurysm. No suspicious lymph nodes. Reproductive: Status post hysterectomy. No adnexal masses. Other: Negative for pelvic effusion or free air. Musculoskeletal: Left hip replacement with artifact. No acute osseous abnormality. IMPRESSION: 1. Findings consistent with high-grade mechanical small bowel obstruction with transition point visualized at left abdominal surgical small bowel anastomosis. Negative for intramural air or free air. 2. Patient is status post partial colectomy. Diverticular disease of residual left colon without acute wall thickening 3. Status post cholecystectomy. Intra and extrahepatic biliary dilatation probably due to postsurgical change Electronically Signed   By: Donavan Foil M.D.   On: 08/10/2021 20:02   DG Abd Portable 1V-Small Bowel Protocol-Position Verification  Result Date: 08/11/2021 CLINICAL DATA:  Small-bowel obstruction, NG tube placement  EXAM: PORTABLE ABDOMEN - 1 VIEW COMPARISON:  08/10/2021 FINDINGS: Stomach is not distended. There is haziness in the mid and lower abdomen, possibly due to fluid-filled small bowel loops. Enteric tube is not seen in the lower thoracic esophagus or stomach and may be coiled within the patient's throat. Linear densities are seen in the lower  lung fields suggesting scarring or subsegmental atelectasis. IMPRESSION: NG tube is not seen in the region of lower thoracic esophagus or stomach. Repositioning of NG tube should be considered. Electronically Signed   By: Elmer Picker M.D.   On: 08/11/2021 18:11   DG Abd Portable 1 View  Result Date: 08/10/2021 CLINICAL DATA:  NG placement. EXAM: PORTABLE ABDOMEN - 1 VIEW COMPARISON:  CT abdomen pelvis dated 08/10/2021. FINDINGS: An enteric tube is not visualized. Support lines noted over the patient. Excreted contrast seen in the renal collecting system. Degenerative changes of the spine. IMPRESSION: No enteric tube seen. Electronically Signed   By: Anner Crete M.D.   On: 08/10/2021 22:05     Labs:   Basic Metabolic Panel: Recent Labs  Lab 08/11/21 1843 08/12/21 0109 08/14/21 0222 08/15/21 0117 08/15/21 0638 08/15/21 1413  NA 143 131* 137 136  --  132*  K 3.8 4.1 3.0* 3.0*  --  3.6  CL 100 104 97* 99  --  98  CO2 19* 18* 27 25  --  24  GLUCOSE 74 70 97 112*  --  242*  BUN 23 20 5* 6*  --  9  CREATININE 1.01* 0.96 0.90 0.80  --  0.83  CALCIUM 9.3 7.9* 8.1* 8.2*  --  8.4*  MG 1.7  --   --   --  1.4*  --    GFR Estimated Creatinine Clearance: 32 mL/min (by C-G formula based on SCr of 0.83 mg/dL). Liver Function Tests: Recent Labs  Lab 08/10/21 1830  AST 23  ALT 7  ALKPHOS 70  BILITOT 0.8  PROT 7.7  ALBUMIN 4.5   Recent Labs  Lab 08/10/21 1830  LIPASE 12   No results for input(s): AMMONIA in the last 168 hours. Coagulation profile No results for input(s): INR, PROTIME in the last 168 hours.  CBC: Recent Labs  Lab 08/10/21 1830 08/12/21 0109 08/14/21 0222  WBC 8.3 6.6 7.4  HGB 14.9 11.5* 12.3  HCT 44.5 35.1* 35.0*  MCV 107.7* 108.0* 102.0*  PLT 278 177 229   Cardiac Enzymes: No results for input(s): CKTOTAL, CKMB, CKMBINDEX, TROPONINI in the last 168 hours. BNP: Invalid input(s): POCBNP CBG: No results for input(s): GLUCAP in the last 168  hours. D-Dimer No results for input(s): DDIMER in the last 72 hours. Hgb A1c No results for input(s): HGBA1C in the last 72 hours. Lipid Profile No results for input(s): CHOL, HDL, LDLCALC, TRIG, CHOLHDL, LDLDIRECT in the last 72 hours. Thyroid function studies No results for input(s): TSH, T4TOTAL, T3FREE, THYROIDAB in the last 72 hours.  Invalid input(s): FREET3 Anemia work up No results for input(s): VITAMINB12, FOLATE, FERRITIN, TIBC, IRON, RETICCTPCT in the last 72 hours. Microbiology Recent Results (from the past 240 hour(s))  Resp Panel by RT-PCR (Flu A&B, Covid) Nasopharyngeal Swab     Status: None   Collection Time: 08/10/21  7:26 PM   Specimen: Nasopharyngeal Swab; Nasopharyngeal(NP) swabs in vial transport medium  Result Value Ref Range Status   SARS Coronavirus 2 by RT PCR NEGATIVE NEGATIVE Final    Comment: (NOTE) SARS-CoV-2 target nucleic acids are NOT DETECTED.  The SARS-CoV-2  RNA is generally detectable in upper respiratory specimens during the acute phase of infection. The lowest concentration of SARS-CoV-2 viral copies this assay can detect is 138 copies/mL. A negative result does not preclude SARS-Cov-2 infection and should not be used as the sole basis for treatment or other patient management decisions. A negative result may occur with  improper specimen collection/handling, submission of specimen other than nasopharyngeal swab, presence of viral mutation(s) within the areas targeted by this assay, and inadequate number of viral copies(<138 copies/mL). A negative result must be combined with clinical observations, patient history, and epidemiological information. The expected result is Negative.  Fact Sheet for Patients:  EntrepreneurPulse.com.au  Fact Sheet for Healthcare Providers:  IncredibleEmployment.be  This test is no t yet approved or cleared by the [place] FDA and  has been authorized for detection  and/or diagnosis of SARS-CoV-2 by FDA under an Emergency Use Authorization (EUA). This EUA will remain  in effect (meaning this test can be used) for the duration of the COVID-19 declaration under Section 564(b)(1) of the Act, 21 U.S.C.section 360bbb-3(b)(1), unless the authorization is terminated  or revoked sooner.       Influenza A by PCR NEGATIVE NEGATIVE Final   Influenza B by PCR NEGATIVE NEGATIVE Final    Comment: (NOTE) The Xpert Xpress SARS-CoV-2/FLU/RSV plus assay is intended as an aid in the diagnosis of influenza from Nasopharyngeal swab specimens and should not be used as a sole basis for treatment. Nasal washings and aspirates are unacceptable for Xpert Xpress SARS-CoV-2/FLU/RSV testing.  Fact Sheet for Patients: EntrepreneurPulse.com.au  Fact Sheet for Healthcare Providers: IncredibleEmployment.be  This test is not yet approved or cleared by the [place] FDA and has been authorized for detection and/or diagnosis of SARS-CoV-2 by FDA under an Emergency Use Authorization (EUA). This EUA will remain in effect (meaning this test can be used) for the duration of the COVID-19 declaration under Section 564(b)(1) of the Act, 21 U.S.C. section 360bbb-3(b)(1), unless the authorization is terminated or revoked.  Performed at [facility], [address], [place], [place] [address]      Discharge Instructions:   Discharge Instructions     Ambulatory referral to Neurology   Complete by: As directed    An appointment is requested in approximately: 2 weeks Neurocognitive testing with neuropsychologist   Discharge instructions   Complete by: As directed    Soft/low residue-- gradually add fiber back to your diet   Increase activity slowly   Complete by: As directed       Allergies as of 08/15/2021       Reactions   Cephalexin Palpitations   Ciprofloxacin Palpitations   Other Itching   Meperidine Hcl     Other reaction(s): Other Doesn't work   Sulfa Antibiotics Nausea And Vomiting   Cefdinir Nausea Only   Doxycycline Nausea Only        Medication List     STOP taking these medications    aspirin EC 81 MG tablet   dicyclomine 10 MG capsule Commonly known as: BENTYL   loperamide 2 MG capsule Commonly known as: IMODIUM   Restasis 0.05 % ophthalmic emulsion Generic drug: cycloSPORINE       TAKE these medications    acetaminophen 500 MG tablet Commonly known as: TYLENOL Take 500 mg by mouth daily as needed for headache or mild pain.   albuterol 108 (90 Base) MCG/ACT inhaler Commonly known as: VENTOLIN HFA Inhale 2 puffs into the lungs every 6 (six) hours  as needed for wheezing or shortness of breath.   clonazePAM 0.5 MG tablet Commonly known as: KLONOPIN Take 1 tablet (0.5 mg total) by mouth 2 (two) times daily as needed for anxiety   feeding supplement Liqd Take 237 mLs by mouth 2 (two) times daily between meals.   gabapentin 100 MG capsule Commonly known as: NEURONTIN Take 3 capsules (300 mg total) by mouth at bedtime.   levocetirizine 5 MG tablet Commonly known as: XYZAL Take 1 tablet (5 mg total) by mouth daily. What changed: when to take this   pantoprazole 40 MG tablet Commonly known as: PROTONIX Take 1 tablet (40 mg total) by mouth 2 (two) times daily. What changed: when to take this   propranolol ER 60 MG 24 hr capsule Commonly known as: INDERAL LA Take 1 capsule (60 mg total) by mouth daily.   rosuvastatin 5 MG tablet Commonly known as: CRESTOR Take 1 tablet (5 mg total) by mouth at bedtime.   Symbicort 160-4.5 MCG/ACT inhaler Generic drug: budesonide-formoterol Inhale 2 puffs into the lungs 2 (two) times daily. What changed: when to take this   valACYclovir 1000 MG tablet Commonly known as: VALTREX TAKE 1 TABLET (1,000 MG TOTAL) BY MOUTH DAILY. What changed:  how much to take when to take this               Durable Medical  Equipment  (From admission, onward)           Start     Ordered   08/13/21 1511  For home use only DME 3 n 1  Once        08/13/21 1510            Follow-up Information     [facility], [facility] Follow up.   Specialty: Home Health Services Why: Someone from [facility] will contact you to arrange start date and time for your therapy. Contact information: [address] [place] [address] [phone]         Hoyt Koch, MD Follow up in 1 week(s).   Specialty: Internal Medicine Contact information: [place] [address] [phone]                  Time coordinating discharge: 35 min  Signed:  Geradine Girt DO  [facility] 08/15/2021, 3:14 PM

## 2021-08-15 NOTE — Plan of Care (Signed)
Problem: Education: Goal: Knowledge of General Education information will improve Description: Including pain rating scale, medication(s)/side effects and non-pharmacologic comfort measures Outcome: Completed/Met   Problem: Health Behavior/Discharge Planning: Goal: Ability to manage health-related needs will improve Outcome: Completed/Met   Problem: Clinical Measurements: Goal: Ability to maintain clinical measurements within normal limits will improve Outcome: Completed/Met Goal: Will remain free from infection Outcome: Completed/Met Goal: Diagnostic test results will improve Outcome: Completed/Met Goal: Respiratory complications will improve Outcome: Completed/Met Goal: Cardiovascular complication will be avoided Outcome: Completed/Met   Problem: Activity: Goal: Risk for activity intolerance will decrease Outcome: Completed/Met   Problem: Nutrition: Goal: Adequate nutrition will be maintained Outcome: Completed/Met   Problem: Coping: Goal: Level of anxiety will decrease Outcome: Completed/Met   Problem: Elimination: Goal: Will not experience complications related to bowel motility Outcome: Completed/Met Goal: Will not experience complications related to urinary retention Outcome: Completed/Met   Problem: Pain Managment: Goal: General experience of comfort will improve Outcome: Completed/Met   Problem: Safety: Goal: Ability to remain free from injury will improve Outcome: Completed/Met   Problem: Skin Integrity: Goal: Risk for impaired skin integrity will decrease Outcome: Completed/Met   Problem: Acute Rehab PT Goals(only PT should resolve) Goal: Pt Will Go Supine/Side To Sit Outcome: Completed/Met Goal: Patient Will Transfer Sit To/From Stand Outcome: Completed/Met Goal: Pt Will Ambulate Outcome: Completed/Met   Problem: Acute Rehab OT Goals (only OT should resolve) Goal: Pt. Will Perform Grooming Outcome: Completed/Met Goal: Pt. Will Perform Lower  Body Bathing Outcome: Completed/Met Goal: Pt. Will Perform Lower Body Dressing Outcome: Completed/Met Goal: Pt. Will Transfer To Toilet Outcome: Completed/Met Goal: Pt. Will Perform Toileting-Clothing Manipulation Outcome: Completed/Met

## 2021-08-15 NOTE — TOC Transition Note (Signed)
Transition of Care [facility]) - CM/SW Discharge Note   Patient Details  Name: Devri Kingston MRN: [id number] Date of Birth: 02-Dec-1931  [facility] CM/SW Contact:  Bartholomew Crews, RN Phone Number: [phone] 08/15/2021, 3:24 PM   Clinical Narrative:     Patient to transition home today. Notified [facility] [place] orders placed 1/26. No further TOC needs identified at this time.   Final next level of care: [place] Barriers to Discharge: No Barriers Identified   Patient Goals and CMS Choice     Choice offered to / list presented to :  (Nephew: Talmadge Chad)  Discharge Placement                       Discharge Plan and Services In-house Referral: NA Discharge Planning Services: CM Consult Post Acute Care Choice: Home Health, Durable Medical Equipment          DME Arranged: 3-N-1 DME Agency: [facility] Date DME Agency Contacted: 08/13/21 Time DME Agency Contacted: [phone] Representative spoke with at DME Agency: Freda Munro HH Arranged: PT, OT [facility] Agency: [place] Date [place]: 08/15/21 Time [place]: 51 Representative spoke with at [place]: Cindie  Social Determinants of Health ([place]) Interventions     Readmission Risk Interventions No flowsheet data found.

## 2021-08-17 ENCOUNTER — Telehealth: Payer: Self-pay

## 2021-08-17 NOTE — Telephone Encounter (Signed)
Transition Care Management Follow-up Telephone Call Date of discharge and from where: TCM [facility] 08-15-21 Dx: SBO How have you been since you were released from the hospital? Tired  Any questions or concerns? No  Items Reviewed: Did the pt receive and understand the discharge instructions provided? Yes  Medications obtained and verified? Yes  Other? No  Any new allergies since your discharge? No  Dietary orders reviewed? Yes Do you have support at home? Yes    Home Care and Equipment/Supplies: Were home health services ordered? yes If so, what is the name of the agency? [facility]  Has the agency set up a time to come to the patient's home? no Were any new equipment or medical supplies ordered?  No What is the name of the medical supply agency? na Were you able to get the supplies/equipment? not applicable Do you have any questions related to the use of the equipment or supplies? No  Functional Questionnaire: (I = Independent and D = Dependent) ADLs: I  Bathing/Dressing- I  Meal Prep- I  Eating- I  Maintaining continence- I  Transferring/Ambulation- I  Managing Meds- I  Follow up appointments reviewed:  PCP Hospital f/u appt confirmed? No- pt states that she will have Lupita Leash (niece)call the [facility] and [facility]? No  . Are transportation arrangements needed? No - will check with nephew and if he can't bring patient she will let office know  If their condition worsens, is the pt aware to call PCP or go to the Emergency Dept.? Yes Was the patient provided with contact information for the PCP's office or ED? Yes Was to pt encouraged to call back with questions or concerns? Yes

## 2021-08-18 DIAGNOSIS — J4540 Moderate persistent asthma, uncomplicated: Secondary | ICD-10-CM | POA: Diagnosis not present

## 2021-08-18 DIAGNOSIS — F419 Anxiety disorder, unspecified: Secondary | ICD-10-CM | POA: Diagnosis not present

## 2021-08-19 ENCOUNTER — Telehealth: Payer: Self-pay | Admitting: Internal Medicine

## 2021-08-19 DIAGNOSIS — E785 Hyperlipidemia, unspecified: Secondary | ICD-10-CM | POA: Diagnosis not present

## 2021-08-19 DIAGNOSIS — E876 Hypokalemia: Secondary | ICD-10-CM | POA: Diagnosis not present

## 2021-08-19 DIAGNOSIS — M479 Spondylosis, unspecified: Secondary | ICD-10-CM | POA: Diagnosis not present

## 2021-08-19 DIAGNOSIS — K56699 Other intestinal obstruction unspecified as to partial versus complete obstruction: Secondary | ICD-10-CM | POA: Diagnosis not present

## 2021-08-19 DIAGNOSIS — K219 Gastro-esophageal reflux disease without esophagitis: Secondary | ICD-10-CM | POA: Diagnosis not present

## 2021-08-19 DIAGNOSIS — G629 Polyneuropathy, unspecified: Secondary | ICD-10-CM | POA: Diagnosis not present

## 2021-08-19 DIAGNOSIS — I10 Essential (primary) hypertension: Secondary | ICD-10-CM | POA: Diagnosis not present

## 2021-08-19 DIAGNOSIS — M6281 Muscle weakness (generalized): Secondary | ICD-10-CM | POA: Diagnosis not present

## 2021-08-19 DIAGNOSIS — J45909 Unspecified asthma, uncomplicated: Secondary | ICD-10-CM | POA: Diagnosis not present

## 2021-08-19 DIAGNOSIS — G8929 Other chronic pain: Secondary | ICD-10-CM | POA: Diagnosis not present

## 2021-08-19 DIAGNOSIS — I700 Atherosclerosis of aorta: Secondary | ICD-10-CM | POA: Diagnosis not present

## 2021-08-19 DIAGNOSIS — Z7951 Long term (current) use of inhaled steroids: Secondary | ICD-10-CM | POA: Diagnosis not present

## 2021-08-19 DIAGNOSIS — Z9181 History of falling: Secondary | ICD-10-CM | POA: Diagnosis not present

## 2021-08-19 DIAGNOSIS — Z87440 Personal history of urinary (tract) infections: Secondary | ICD-10-CM | POA: Diagnosis not present

## 2021-08-19 DIAGNOSIS — E871 Hypo-osmolality and hyponatremia: Secondary | ICD-10-CM | POA: Diagnosis not present

## 2021-08-19 DIAGNOSIS — Z96642 Presence of left artificial hip joint: Secondary | ICD-10-CM | POA: Diagnosis not present

## 2021-08-19 DIAGNOSIS — Z79899 Other long term (current) drug therapy: Secondary | ICD-10-CM | POA: Diagnosis not present

## 2021-08-19 DIAGNOSIS — M170 Bilateral primary osteoarthritis of knee: Secondary | ICD-10-CM | POA: Diagnosis not present

## 2021-08-19 DIAGNOSIS — K449 Diaphragmatic hernia without obstruction or gangrene: Secondary | ICD-10-CM | POA: Diagnosis not present

## 2021-08-19 DIAGNOSIS — K56609 Unspecified intestinal obstruction, unspecified as to partial versus complete obstruction: Secondary | ICD-10-CM | POA: Diagnosis not present

## 2021-08-19 NOTE — Telephone Encounter (Signed)
[place]  Phone:  [phone]  Concerns:  patient has been in hospital - during visit today bp 160/90 - chronic headache, chronic poor appetite, had fall on Sunday - no injuries reported - chronic back pain 9 out of 10 - red spot on outside of left lateral foot - Patient has doctors appointment on Friday of this week. Also concerned that patient has poor mood and feels isolated.  Orders:  Physical  therapy 1 week x 1, 2 weeks x 3 Pending medical social worker visit, pending OT evaluation  Recommends speech therapy for swallowing  Home Health Aid for bathing  2 x a week for 3 weeks

## 2021-08-20 DIAGNOSIS — Z79899 Other long term (current) drug therapy: Secondary | ICD-10-CM | POA: Diagnosis not present

## 2021-08-20 DIAGNOSIS — Z96642 Presence of left artificial hip joint: Secondary | ICD-10-CM | POA: Diagnosis not present

## 2021-08-20 DIAGNOSIS — K219 Gastro-esophageal reflux disease without esophagitis: Secondary | ICD-10-CM | POA: Diagnosis not present

## 2021-08-20 DIAGNOSIS — K449 Diaphragmatic hernia without obstruction or gangrene: Secondary | ICD-10-CM | POA: Diagnosis not present

## 2021-08-20 DIAGNOSIS — M6281 Muscle weakness (generalized): Secondary | ICD-10-CM | POA: Diagnosis not present

## 2021-08-20 DIAGNOSIS — Z87440 Personal history of urinary (tract) infections: Secondary | ICD-10-CM | POA: Diagnosis not present

## 2021-08-20 DIAGNOSIS — E871 Hypo-osmolality and hyponatremia: Secondary | ICD-10-CM | POA: Diagnosis not present

## 2021-08-20 DIAGNOSIS — K56609 Unspecified intestinal obstruction, unspecified as to partial versus complete obstruction: Secondary | ICD-10-CM | POA: Diagnosis not present

## 2021-08-20 DIAGNOSIS — G8929 Other chronic pain: Secondary | ICD-10-CM | POA: Diagnosis not present

## 2021-08-20 DIAGNOSIS — E785 Hyperlipidemia, unspecified: Secondary | ICD-10-CM | POA: Diagnosis not present

## 2021-08-20 DIAGNOSIS — Z9181 History of falling: Secondary | ICD-10-CM | POA: Diagnosis not present

## 2021-08-20 DIAGNOSIS — M170 Bilateral primary osteoarthritis of knee: Secondary | ICD-10-CM | POA: Diagnosis not present

## 2021-08-20 DIAGNOSIS — G629 Polyneuropathy, unspecified: Secondary | ICD-10-CM | POA: Diagnosis not present

## 2021-08-20 DIAGNOSIS — K56699 Other intestinal obstruction unspecified as to partial versus complete obstruction: Secondary | ICD-10-CM | POA: Diagnosis not present

## 2021-08-20 DIAGNOSIS — I700 Atherosclerosis of aorta: Secondary | ICD-10-CM | POA: Diagnosis not present

## 2021-08-20 DIAGNOSIS — Z7951 Long term (current) use of inhaled steroids: Secondary | ICD-10-CM | POA: Diagnosis not present

## 2021-08-20 DIAGNOSIS — J45909 Unspecified asthma, uncomplicated: Secondary | ICD-10-CM | POA: Diagnosis not present

## 2021-08-20 DIAGNOSIS — I10 Essential (primary) hypertension: Secondary | ICD-10-CM | POA: Diagnosis not present

## 2021-08-20 DIAGNOSIS — M479 Spondylosis, unspecified: Secondary | ICD-10-CM | POA: Diagnosis not present

## 2021-08-20 DIAGNOSIS — E876 Hypokalemia: Secondary | ICD-10-CM | POA: Diagnosis not present

## 2021-08-20 NOTE — Telephone Encounter (Signed)
Okay for verbal orders. 

## 2021-08-21 ENCOUNTER — Encounter: Payer: Self-pay | Admitting: Internal Medicine

## 2021-08-21 ENCOUNTER — Other Ambulatory Visit: Payer: Self-pay

## 2021-08-21 ENCOUNTER — Other Ambulatory Visit (HOSPITAL_COMMUNITY): Payer: Self-pay

## 2021-08-21 ENCOUNTER — Ambulatory Visit (INDEPENDENT_AMBULATORY_CARE_PROVIDER_SITE_OTHER): Payer: Medicare Other | Admitting: Internal Medicine

## 2021-08-21 DIAGNOSIS — K56609 Unspecified intestinal obstruction, unspecified as to partial versus complete obstruction: Secondary | ICD-10-CM | POA: Diagnosis not present

## 2021-08-21 DIAGNOSIS — R630 Anorexia: Secondary | ICD-10-CM

## 2021-08-21 DIAGNOSIS — F419 Anxiety disorder, unspecified: Secondary | ICD-10-CM | POA: Diagnosis not present

## 2021-08-21 MED ORDER — CLONAZEPAM 0.5 MG PO TABS
0.5000 mg | ORAL_TABLET | Freq: Two times a day (BID) | ORAL | 5 refills | Status: DC | PRN
Start: 2021-08-21 — End: 2021-11-10
  Filled 2021-08-21: qty 60, 30d supply, fill #0

## 2021-08-21 MED ORDER — ROSUVASTATIN CALCIUM 5 MG PO TABS
5.0000 mg | ORAL_TABLET | Freq: Every day | ORAL | 3 refills | Status: DC
  Filled 2021-08-21: qty 90, 90d supply, fill #0
  Filled 2021-12-14: qty 90, 90d supply, fill #1
  Filled 2022-03-11: qty 90, 90d supply, fill #2
  Filled 2022-06-17: qty 90, 90d supply, fill #3

## 2021-08-21 MED ORDER — BOOST BREEZE PO LIQD
237.0000 mL | Freq: Every day | ORAL | 3 refills | Status: DC
  Filled 2021-08-21: qty 2370, fill #0

## 2021-08-21 MED ORDER — LEVOCETIRIZINE DIHYDROCHLORIDE 5 MG PO TABS
5.0000 mg | ORAL_TABLET | Freq: Every day | ORAL | 3 refills | Status: DC
Start: 2021-08-21 — End: 2022-07-21
  Filled 2021-08-21: qty 90, 90d supply, fill #0
  Filled 2021-12-14: qty 90, 90d supply, fill #1
  Filled 2022-03-11: qty 90, 90d supply, fill #2
  Filled 2022-06-17: qty 90, 90d supply, fill #3

## 2021-08-21 MED ORDER — SYMBICORT 160-4.5 MCG/ACT IN AERO
2.0000 | INHALATION_SPRAY | Freq: Two times a day (BID) | RESPIRATORY_TRACT | 11 refills | Status: DC
  Filled 2021-08-21: qty 10.2, 30d supply, fill #0
  Filled 2021-09-21: qty 10.2, 30d supply, fill #1
  Filled 2022-01-14: qty 10.2, 30d supply, fill #2
  Filled 2022-04-24: qty 10.2, 30d supply, fill #3

## 2021-08-21 NOTE — Assessment & Plan Note (Signed)
Rx boost which she had in the hospital and liked taste. Needs non-dairy due to stomach.

## 2021-08-21 NOTE — Progress Notes (Signed)
° °  Subjective:   Patient ID: Tina Riley, female    DOB: Dec 13, 1931, 88 y.o.   MRN: [id number]  HPI The patient is an 88 YO female coming in for hospital follow up. Was in with SBO and resolved without intervention. She is still feeling very tired. Recent loss of sister.  PMH, [employer], social history reviewed and updated  Review of Systems  Constitutional:  Positive for activity change, appetite change and fatigue.  HENT: Negative.    Eyes: Negative.   Respiratory:  Negative for cough, chest tightness and shortness of breath.   Cardiovascular:  Negative for chest pain, palpitations and leg swelling.  Gastrointestinal:  Negative for abdominal distention, abdominal pain, constipation, diarrhea, nausea and vomiting.  Musculoskeletal: Negative.   Skin: Negative.   Neurological: Negative.   Psychiatric/Behavioral:  The patient is nervous/anxious.    Objective:  Physical Exam Constitutional:      Appearance: She is well-developed.  HENT:     Head: Normocephalic and atraumatic.  Cardiovascular:     Rate and Rhythm: Normal rate and regular rhythm.  Pulmonary:     Effort: Pulmonary effort is normal. No respiratory distress.     Breath sounds: Normal breath sounds. No wheezing or rales.  Abdominal:     General: Bowel sounds are normal. There is no distension.     Palpations: Abdomen is soft.     Tenderness: There is no abdominal tenderness. There is no rebound.  Musculoskeletal:     Cervical back: Normal range of motion.  Skin:    General: Skin is warm and dry.  Neurological:     Mental Status: She is alert and oriented to person, place, and time.     Coordination: Coordination abnormal.     Comments: Wheelchair for long distances    Vitals:   08/21/21 1045  BP: (!) 148/70  Pulse: (!) 59  Temp: 97.6 F (36.4 C)  TempSrc: Oral  SpO2: 99%  Weight: 101 lb 9.6 oz (46.1 kg)  Height: 4\' 11"  (1.499 m)    This visit occurred during the SARS-CoV-2 public health emergency.   Safety protocols were in place, including screening questions prior to the visit, additional usage of staff PPE, and extensive cleaning of exam room while observing appropriate contact time as indicated for disinfecting solutions.   Assessment & Plan:

## 2021-08-21 NOTE — Assessment & Plan Note (Signed)
Refill clonazepam. She has tried daily oral medications like zoloft in the past with significant side effects and feels like this is effective for now.

## 2021-08-21 NOTE — Patient Instructions (Signed)
We have sent in the refills of the medicines including clonazepam.

## 2021-08-21 NOTE — Assessment & Plan Note (Signed)
Still moving bowels and flatus at home. Appetite is still poor. She does have significant adhesions and we discussed to monitor closely.

## 2021-08-24 ENCOUNTER — Telehealth: Payer: Self-pay | Admitting: Internal Medicine

## 2021-08-24 NOTE — Telephone Encounter (Signed)
Threasa Alpha from [facility] called [phone]  Reason For Call:  Medication Reconcilliation  2 Level medicaitons:  Albuterol interacts with propranolol  Cymbicort also with propranolol

## 2021-08-24 NOTE — Telephone Encounter (Signed)
Okay to continue medications

## 2021-08-24 NOTE — Telephone Encounter (Signed)
See below

## 2021-08-25 ENCOUNTER — Telehealth: Payer: Self-pay | Admitting: Internal Medicine

## 2021-08-25 DIAGNOSIS — K56609 Unspecified intestinal obstruction, unspecified as to partial versus complete obstruction: Secondary | ICD-10-CM | POA: Diagnosis not present

## 2021-08-25 DIAGNOSIS — Z7951 Long term (current) use of inhaled steroids: Secondary | ICD-10-CM | POA: Diagnosis not present

## 2021-08-25 DIAGNOSIS — E876 Hypokalemia: Secondary | ICD-10-CM | POA: Diagnosis not present

## 2021-08-25 DIAGNOSIS — I10 Essential (primary) hypertension: Secondary | ICD-10-CM | POA: Diagnosis not present

## 2021-08-25 DIAGNOSIS — Z9181 History of falling: Secondary | ICD-10-CM | POA: Diagnosis not present

## 2021-08-25 DIAGNOSIS — K219 Gastro-esophageal reflux disease without esophagitis: Secondary | ICD-10-CM | POA: Diagnosis not present

## 2021-08-25 DIAGNOSIS — K449 Diaphragmatic hernia without obstruction or gangrene: Secondary | ICD-10-CM | POA: Diagnosis not present

## 2021-08-25 DIAGNOSIS — G629 Polyneuropathy, unspecified: Secondary | ICD-10-CM | POA: Diagnosis not present

## 2021-08-25 DIAGNOSIS — I700 Atherosclerosis of aorta: Secondary | ICD-10-CM | POA: Diagnosis not present

## 2021-08-25 DIAGNOSIS — J45909 Unspecified asthma, uncomplicated: Secondary | ICD-10-CM | POA: Diagnosis not present

## 2021-08-25 DIAGNOSIS — Z87440 Personal history of urinary (tract) infections: Secondary | ICD-10-CM | POA: Diagnosis not present

## 2021-08-25 DIAGNOSIS — Z79899 Other long term (current) drug therapy: Secondary | ICD-10-CM | POA: Diagnosis not present

## 2021-08-25 DIAGNOSIS — G8929 Other chronic pain: Secondary | ICD-10-CM | POA: Diagnosis not present

## 2021-08-25 DIAGNOSIS — Z96642 Presence of left artificial hip joint: Secondary | ICD-10-CM | POA: Diagnosis not present

## 2021-08-25 DIAGNOSIS — E871 Hypo-osmolality and hyponatremia: Secondary | ICD-10-CM | POA: Diagnosis not present

## 2021-08-25 DIAGNOSIS — E785 Hyperlipidemia, unspecified: Secondary | ICD-10-CM | POA: Diagnosis not present

## 2021-08-25 DIAGNOSIS — M479 Spondylosis, unspecified: Secondary | ICD-10-CM | POA: Diagnosis not present

## 2021-08-25 DIAGNOSIS — K56699 Other intestinal obstruction unspecified as to partial versus complete obstruction: Secondary | ICD-10-CM | POA: Diagnosis not present

## 2021-08-25 DIAGNOSIS — M170 Bilateral primary osteoarthritis of knee: Secondary | ICD-10-CM | POA: Diagnosis not present

## 2021-08-25 DIAGNOSIS — M6281 Muscle weakness (generalized): Secondary | ICD-10-CM | POA: Diagnosis not present

## 2021-08-25 NOTE — Telephone Encounter (Signed)
Called Jim. LDVM at [phone] with verbal orders. Office number was provided in case he has additional questions.  °

## 2021-08-25 NOTE — Telephone Encounter (Signed)
Referral was placed on 08/05/2021

## 2021-08-25 NOTE — Telephone Encounter (Signed)
Called Tina Riley. LDVM at [phone] with verbal orders. Office number was provided in case he has additional questions.

## 2021-08-25 NOTE — Telephone Encounter (Signed)
Tina Riley states pt has had a hx of esophageal dilation, and is recommending a refferal to a Gastroenterologist to determine if it needs to be done again.   Callback #- [phone]

## 2021-08-25 NOTE — Telephone Encounter (Signed)
Okay for verbals °

## 2021-08-25 NOTE — Telephone Encounter (Signed)
See below

## 2021-08-25 NOTE — Telephone Encounter (Signed)
Home Health verbal orders-caller/Agency: Michelle/ [facility] number: [phone] (secure vm)  Requesting OT/PT/Skilled nursing/Social Work/Speech: OT  Reason:Completed OT Eval 08-25-2021  Frequency: 1w1 2w2 1w1

## 2021-08-27 DIAGNOSIS — K449 Diaphragmatic hernia without obstruction or gangrene: Secondary | ICD-10-CM | POA: Diagnosis not present

## 2021-08-27 DIAGNOSIS — Z7951 Long term (current) use of inhaled steroids: Secondary | ICD-10-CM | POA: Diagnosis not present

## 2021-08-27 DIAGNOSIS — E871 Hypo-osmolality and hyponatremia: Secondary | ICD-10-CM | POA: Diagnosis not present

## 2021-08-27 DIAGNOSIS — M6281 Muscle weakness (generalized): Secondary | ICD-10-CM | POA: Diagnosis not present

## 2021-08-27 DIAGNOSIS — E876 Hypokalemia: Secondary | ICD-10-CM | POA: Diagnosis not present

## 2021-08-27 DIAGNOSIS — K56609 Unspecified intestinal obstruction, unspecified as to partial versus complete obstruction: Secondary | ICD-10-CM | POA: Diagnosis not present

## 2021-08-27 DIAGNOSIS — J45909 Unspecified asthma, uncomplicated: Secondary | ICD-10-CM | POA: Diagnosis not present

## 2021-08-27 DIAGNOSIS — K56699 Other intestinal obstruction unspecified as to partial versus complete obstruction: Secondary | ICD-10-CM | POA: Diagnosis not present

## 2021-08-27 DIAGNOSIS — Z79899 Other long term (current) drug therapy: Secondary | ICD-10-CM | POA: Diagnosis not present

## 2021-08-27 DIAGNOSIS — Z9181 History of falling: Secondary | ICD-10-CM | POA: Diagnosis not present

## 2021-08-27 DIAGNOSIS — M170 Bilateral primary osteoarthritis of knee: Secondary | ICD-10-CM | POA: Diagnosis not present

## 2021-08-27 DIAGNOSIS — E785 Hyperlipidemia, unspecified: Secondary | ICD-10-CM | POA: Diagnosis not present

## 2021-08-27 DIAGNOSIS — I10 Essential (primary) hypertension: Secondary | ICD-10-CM | POA: Diagnosis not present

## 2021-08-27 DIAGNOSIS — K219 Gastro-esophageal reflux disease without esophagitis: Secondary | ICD-10-CM | POA: Diagnosis not present

## 2021-08-27 DIAGNOSIS — Z87440 Personal history of urinary (tract) infections: Secondary | ICD-10-CM | POA: Diagnosis not present

## 2021-08-27 DIAGNOSIS — G8929 Other chronic pain: Secondary | ICD-10-CM | POA: Diagnosis not present

## 2021-08-27 DIAGNOSIS — M479 Spondylosis, unspecified: Secondary | ICD-10-CM | POA: Diagnosis not present

## 2021-08-27 DIAGNOSIS — G629 Polyneuropathy, unspecified: Secondary | ICD-10-CM | POA: Diagnosis not present

## 2021-08-27 DIAGNOSIS — I700 Atherosclerosis of aorta: Secondary | ICD-10-CM | POA: Diagnosis not present

## 2021-08-27 DIAGNOSIS — Z96642 Presence of left artificial hip joint: Secondary | ICD-10-CM | POA: Diagnosis not present

## 2021-08-27 NOTE — Telephone Encounter (Signed)
Spoke with to give verbal orders. No other questions or concerns.

## 2021-08-31 DIAGNOSIS — E871 Hypo-osmolality and hyponatremia: Secondary | ICD-10-CM | POA: Diagnosis not present

## 2021-08-31 DIAGNOSIS — Z87440 Personal history of urinary (tract) infections: Secondary | ICD-10-CM | POA: Diagnosis not present

## 2021-08-31 DIAGNOSIS — E785 Hyperlipidemia, unspecified: Secondary | ICD-10-CM | POA: Diagnosis not present

## 2021-08-31 DIAGNOSIS — K449 Diaphragmatic hernia without obstruction or gangrene: Secondary | ICD-10-CM | POA: Diagnosis not present

## 2021-08-31 DIAGNOSIS — M479 Spondylosis, unspecified: Secondary | ICD-10-CM | POA: Diagnosis not present

## 2021-08-31 DIAGNOSIS — K56609 Unspecified intestinal obstruction, unspecified as to partial versus complete obstruction: Secondary | ICD-10-CM | POA: Diagnosis not present

## 2021-08-31 DIAGNOSIS — J45909 Unspecified asthma, uncomplicated: Secondary | ICD-10-CM | POA: Diagnosis not present

## 2021-08-31 DIAGNOSIS — G629 Polyneuropathy, unspecified: Secondary | ICD-10-CM | POA: Diagnosis not present

## 2021-08-31 DIAGNOSIS — I700 Atherosclerosis of aorta: Secondary | ICD-10-CM | POA: Diagnosis not present

## 2021-08-31 DIAGNOSIS — M170 Bilateral primary osteoarthritis of knee: Secondary | ICD-10-CM | POA: Diagnosis not present

## 2021-08-31 DIAGNOSIS — Z9181 History of falling: Secondary | ICD-10-CM | POA: Diagnosis not present

## 2021-08-31 DIAGNOSIS — M6281 Muscle weakness (generalized): Secondary | ICD-10-CM | POA: Diagnosis not present

## 2021-08-31 DIAGNOSIS — K56699 Other intestinal obstruction unspecified as to partial versus complete obstruction: Secondary | ICD-10-CM | POA: Diagnosis not present

## 2021-08-31 DIAGNOSIS — K219 Gastro-esophageal reflux disease without esophagitis: Secondary | ICD-10-CM | POA: Diagnosis not present

## 2021-08-31 DIAGNOSIS — Z7951 Long term (current) use of inhaled steroids: Secondary | ICD-10-CM | POA: Diagnosis not present

## 2021-08-31 DIAGNOSIS — G8929 Other chronic pain: Secondary | ICD-10-CM | POA: Diagnosis not present

## 2021-08-31 DIAGNOSIS — I10 Essential (primary) hypertension: Secondary | ICD-10-CM | POA: Diagnosis not present

## 2021-08-31 DIAGNOSIS — Z79899 Other long term (current) drug therapy: Secondary | ICD-10-CM | POA: Diagnosis not present

## 2021-08-31 DIAGNOSIS — Z96642 Presence of left artificial hip joint: Secondary | ICD-10-CM | POA: Diagnosis not present

## 2021-08-31 DIAGNOSIS — E876 Hypokalemia: Secondary | ICD-10-CM | POA: Diagnosis not present

## 2021-09-02 ENCOUNTER — Other Ambulatory Visit: Payer: Self-pay | Admitting: Internal Medicine

## 2021-09-02 ENCOUNTER — Other Ambulatory Visit (HOSPITAL_COMMUNITY): Payer: Self-pay

## 2021-09-02 DIAGNOSIS — M6281 Muscle weakness (generalized): Secondary | ICD-10-CM | POA: Diagnosis not present

## 2021-09-02 DIAGNOSIS — I700 Atherosclerosis of aorta: Secondary | ICD-10-CM | POA: Diagnosis not present

## 2021-09-02 DIAGNOSIS — E876 Hypokalemia: Secondary | ICD-10-CM | POA: Diagnosis not present

## 2021-09-02 DIAGNOSIS — Z79899 Other long term (current) drug therapy: Secondary | ICD-10-CM | POA: Diagnosis not present

## 2021-09-02 DIAGNOSIS — J45909 Unspecified asthma, uncomplicated: Secondary | ICD-10-CM | POA: Diagnosis not present

## 2021-09-02 DIAGNOSIS — M170 Bilateral primary osteoarthritis of knee: Secondary | ICD-10-CM | POA: Diagnosis not present

## 2021-09-02 DIAGNOSIS — K56699 Other intestinal obstruction unspecified as to partial versus complete obstruction: Secondary | ICD-10-CM | POA: Diagnosis not present

## 2021-09-02 DIAGNOSIS — E871 Hypo-osmolality and hyponatremia: Secondary | ICD-10-CM | POA: Diagnosis not present

## 2021-09-02 DIAGNOSIS — Z9181 History of falling: Secondary | ICD-10-CM | POA: Diagnosis not present

## 2021-09-02 DIAGNOSIS — Z7951 Long term (current) use of inhaled steroids: Secondary | ICD-10-CM | POA: Diagnosis not present

## 2021-09-02 DIAGNOSIS — K56609 Unspecified intestinal obstruction, unspecified as to partial versus complete obstruction: Secondary | ICD-10-CM | POA: Diagnosis not present

## 2021-09-02 DIAGNOSIS — G629 Polyneuropathy, unspecified: Secondary | ICD-10-CM | POA: Diagnosis not present

## 2021-09-02 DIAGNOSIS — M479 Spondylosis, unspecified: Secondary | ICD-10-CM | POA: Diagnosis not present

## 2021-09-02 DIAGNOSIS — Z96642 Presence of left artificial hip joint: Secondary | ICD-10-CM | POA: Diagnosis not present

## 2021-09-02 DIAGNOSIS — I10 Essential (primary) hypertension: Secondary | ICD-10-CM | POA: Diagnosis not present

## 2021-09-02 DIAGNOSIS — G8929 Other chronic pain: Secondary | ICD-10-CM | POA: Diagnosis not present

## 2021-09-02 DIAGNOSIS — Z87440 Personal history of urinary (tract) infections: Secondary | ICD-10-CM | POA: Diagnosis not present

## 2021-09-02 DIAGNOSIS — E785 Hyperlipidemia, unspecified: Secondary | ICD-10-CM | POA: Diagnosis not present

## 2021-09-02 DIAGNOSIS — K449 Diaphragmatic hernia without obstruction or gangrene: Secondary | ICD-10-CM | POA: Diagnosis not present

## 2021-09-02 DIAGNOSIS — K219 Gastro-esophageal reflux disease without esophagitis: Secondary | ICD-10-CM | POA: Diagnosis not present

## 2021-09-03 ENCOUNTER — Telehealth: Payer: Self-pay | Admitting: Internal Medicine

## 2021-09-03 DIAGNOSIS — E785 Hyperlipidemia, unspecified: Secondary | ICD-10-CM | POA: Diagnosis not present

## 2021-09-03 DIAGNOSIS — M479 Spondylosis, unspecified: Secondary | ICD-10-CM | POA: Diagnosis not present

## 2021-09-03 DIAGNOSIS — Z96642 Presence of left artificial hip joint: Secondary | ICD-10-CM | POA: Diagnosis not present

## 2021-09-03 DIAGNOSIS — K219 Gastro-esophageal reflux disease without esophagitis: Secondary | ICD-10-CM | POA: Diagnosis not present

## 2021-09-03 DIAGNOSIS — K56699 Other intestinal obstruction unspecified as to partial versus complete obstruction: Secondary | ICD-10-CM | POA: Diagnosis not present

## 2021-09-03 DIAGNOSIS — G629 Polyneuropathy, unspecified: Secondary | ICD-10-CM | POA: Diagnosis not present

## 2021-09-03 DIAGNOSIS — Z87440 Personal history of urinary (tract) infections: Secondary | ICD-10-CM | POA: Diagnosis not present

## 2021-09-03 DIAGNOSIS — J45909 Unspecified asthma, uncomplicated: Secondary | ICD-10-CM | POA: Diagnosis not present

## 2021-09-03 DIAGNOSIS — Z7951 Long term (current) use of inhaled steroids: Secondary | ICD-10-CM | POA: Diagnosis not present

## 2021-09-03 DIAGNOSIS — G8929 Other chronic pain: Secondary | ICD-10-CM | POA: Diagnosis not present

## 2021-09-03 DIAGNOSIS — I10 Essential (primary) hypertension: Secondary | ICD-10-CM | POA: Diagnosis not present

## 2021-09-03 DIAGNOSIS — K449 Diaphragmatic hernia without obstruction or gangrene: Secondary | ICD-10-CM | POA: Diagnosis not present

## 2021-09-03 DIAGNOSIS — K56609 Unspecified intestinal obstruction, unspecified as to partial versus complete obstruction: Secondary | ICD-10-CM | POA: Diagnosis not present

## 2021-09-03 DIAGNOSIS — M170 Bilateral primary osteoarthritis of knee: Secondary | ICD-10-CM | POA: Diagnosis not present

## 2021-09-03 DIAGNOSIS — M6281 Muscle weakness (generalized): Secondary | ICD-10-CM | POA: Diagnosis not present

## 2021-09-03 DIAGNOSIS — E876 Hypokalemia: Secondary | ICD-10-CM | POA: Diagnosis not present

## 2021-09-03 DIAGNOSIS — Z79899 Other long term (current) drug therapy: Secondary | ICD-10-CM | POA: Diagnosis not present

## 2021-09-03 DIAGNOSIS — Z9181 History of falling: Secondary | ICD-10-CM | POA: Diagnosis not present

## 2021-09-03 DIAGNOSIS — E871 Hypo-osmolality and hyponatremia: Secondary | ICD-10-CM | POA: Diagnosis not present

## 2021-09-03 DIAGNOSIS — I700 Atherosclerosis of aorta: Secondary | ICD-10-CM | POA: Diagnosis not present

## 2021-09-03 NOTE — Telephone Encounter (Signed)
Patient is still having trouble swallowing.  Patient needs an esophogram completed - please advise.  Please call Deidre at [facility] when scheduled:  [phone]  Patient does not want to see Dr. Leone Payor again - needs full esophogram

## 2021-09-07 ENCOUNTER — Ambulatory Visit (INDEPENDENT_AMBULATORY_CARE_PROVIDER_SITE_OTHER): Payer: Medicare Other | Admitting: Licensed Clinical Social Worker

## 2021-09-07 ENCOUNTER — Telehealth: Payer: Self-pay | Admitting: Internal Medicine

## 2021-09-07 ENCOUNTER — Other Ambulatory Visit (HOSPITAL_COMMUNITY): Payer: Self-pay

## 2021-09-07 DIAGNOSIS — K449 Diaphragmatic hernia without obstruction or gangrene: Secondary | ICD-10-CM | POA: Diagnosis not present

## 2021-09-07 DIAGNOSIS — F419 Anxiety disorder, unspecified: Secondary | ICD-10-CM

## 2021-09-07 DIAGNOSIS — G629 Polyneuropathy, unspecified: Secondary | ICD-10-CM | POA: Diagnosis not present

## 2021-09-07 DIAGNOSIS — E785 Hyperlipidemia, unspecified: Secondary | ICD-10-CM | POA: Diagnosis not present

## 2021-09-07 DIAGNOSIS — E876 Hypokalemia: Secondary | ICD-10-CM | POA: Diagnosis not present

## 2021-09-07 DIAGNOSIS — J45909 Unspecified asthma, uncomplicated: Secondary | ICD-10-CM | POA: Diagnosis not present

## 2021-09-07 DIAGNOSIS — M170 Bilateral primary osteoarthritis of knee: Secondary | ICD-10-CM | POA: Diagnosis not present

## 2021-09-07 DIAGNOSIS — E871 Hypo-osmolality and hyponatremia: Secondary | ICD-10-CM | POA: Diagnosis not present

## 2021-09-07 DIAGNOSIS — Z7951 Long term (current) use of inhaled steroids: Secondary | ICD-10-CM | POA: Diagnosis not present

## 2021-09-07 DIAGNOSIS — M479 Spondylosis, unspecified: Secondary | ICD-10-CM | POA: Diagnosis not present

## 2021-09-07 DIAGNOSIS — Z96642 Presence of left artificial hip joint: Secondary | ICD-10-CM | POA: Diagnosis not present

## 2021-09-07 DIAGNOSIS — Z87440 Personal history of urinary (tract) infections: Secondary | ICD-10-CM | POA: Diagnosis not present

## 2021-09-07 DIAGNOSIS — K56699 Other intestinal obstruction unspecified as to partial versus complete obstruction: Secondary | ICD-10-CM | POA: Diagnosis not present

## 2021-09-07 DIAGNOSIS — K56609 Unspecified intestinal obstruction, unspecified as to partial versus complete obstruction: Secondary | ICD-10-CM | POA: Diagnosis not present

## 2021-09-07 DIAGNOSIS — I10 Essential (primary) hypertension: Secondary | ICD-10-CM | POA: Diagnosis not present

## 2021-09-07 DIAGNOSIS — Z79899 Other long term (current) drug therapy: Secondary | ICD-10-CM | POA: Diagnosis not present

## 2021-09-07 DIAGNOSIS — G8929 Other chronic pain: Secondary | ICD-10-CM | POA: Diagnosis not present

## 2021-09-07 DIAGNOSIS — Z9181 History of falling: Secondary | ICD-10-CM | POA: Diagnosis not present

## 2021-09-07 DIAGNOSIS — M6281 Muscle weakness (generalized): Secondary | ICD-10-CM | POA: Diagnosis not present

## 2021-09-07 DIAGNOSIS — J4540 Moderate persistent asthma, uncomplicated: Secondary | ICD-10-CM

## 2021-09-07 DIAGNOSIS — K219 Gastro-esophageal reflux disease without esophagitis: Secondary | ICD-10-CM | POA: Diagnosis not present

## 2021-09-07 DIAGNOSIS — I700 Atherosclerosis of aorta: Secondary | ICD-10-CM | POA: Diagnosis not present

## 2021-09-07 NOTE — Chronic Care Management (AMB) (Cosign Needed)
Chronic Care Management   Clinical Social Work Note  09/07/2021 Name: Tina Riley MRN: [id number] DOB: [birth year]  Tina Riley is a 88 y.o. year old female who is a primary care patient of Myrlene Broker, MD. The CCM team was consulted to assist the patient with chronic disease management and/or care coordination needs related to: Level of Care Concerns.   Engaged with patient by telephone for follow up visit in response to provider referral for social work chronic care management and care coordination services.   Consent to Services:  The patient was given information about Chronic Care Management services, agreed to services, and gave verbal consent prior to initiation of services.  Please see initial visit note for detailed documentation.   Patient agreed to services and consent obtained.   Summary: Assessed patient's current treatment, progress, coping skills, support system and barriers to care.  She continues to experience difficulty with meeting care needs.  Has spoken with her nephew who is helping with care needs as she tries to remain as independent as possible. No new goals identified in this encounter. Referral placed for senior resources patient reports no one has called her as of today.  Per [facility], referral has been excepted .  See Care Plan below for interventions and patient self-care actives.  Recommendation: Patient may benefit from grief support with loss of sister however she declines at this time.   Follow up Plan: Patient would like continued follow-up from CCM LCSW.  per patient's request will follow up in 30 days.  Will call office if needed prior to next encounter.   Assessment: Review of patient past medical history, allergies, medications, and health status, including review of relevant consultants reports was performed today as part of a comprehensive evaluation and provision of chronic care management and care coordination services.      SDOH (Social Determinants of Health) assessments and interventions performed:    Advanced Directives Status: See Vynca application for related entries.  CCM Care Plan   Conditions to be addressed/monitored: ; Limited social support and Level of care concerns  Care Plan : LCSW Plan of Care  Updates made by Soundra Pilon, LCSW since 09/07/2021 12:00 AM     Problem: Mobility and Independence      Long-Range Goal: Mobility and Independence Optimized by addtional home support   Start Date: 08/04/2021  This Visit's Progress: On track  Recent Progress: On track  Priority: High  Note:   Current Barriers:  Level of Care Concerns:Inability to perform IADL's independently and Inability to perform ADL's independently  CSW Clinical Goal(s):  Patient  will work with resources discussed to address needs related to level of care and home needs  through collaboration with Clinical Child psychotherapist, provider, and care team.   Interventions: 1:1 collaboration with primary care provider regarding development and update of comprehensive plan of care as evidenced by provider attestation and co-signature Inter-disciplinary care team collaboration (see longitudinal plan of care) Evaluation of current treatment plan related to  self management and patient's adherence to plan as established by provider Review resources, discussed options and provided patient information about  Department of Social Services ( for in-home aide program ) Referral for [employer] (for meals on wheels and Telecare) Services provided by [employer]    Level of Care Concerns in a patient with Anxiety, GERD, and Asthma:  (Status: Goal on Track (progressing): YES.) Current level of care: home, alone and support system is nephew  Evaluation of  patient safety in current living environment:  Solution-Focused Strategies employed:  Active listening / Reflection utilized  Emotional Support Provided Problem Solving /Task  Center strategies reviewed For all in-home services ( private pay; PCS, etc) Collaborated with DSS for in-Home Aide Service [phone] to put patient on Wait list Made referral to via [facility] Senior Resources for [employer] on [employer] and Telecare program  Patient Self-Care Activities: We completed your referral with DSS In-Home Aide program, you are on the wait list I have placed a referral Senior Resources for meals on wheel, this is also a long wait list they will contact you.        Sammuel Hines, LCSW Licensed Clinical Social Worker [facility]  [facility]  [phone]

## 2021-09-07 NOTE — Telephone Encounter (Signed)
Tina Riley calls today in regards to some health updates for PT. PT is dealing with daily headaches. Her blood pressure is also running 150/86 most days. She's having difficulty chewing food and is not tolerating the use of adhesive with her dentures. She is also having difficulty swallowing through a combination of not being able to swallow and possibly something esophageal. All of this combining into poor nutrition. She has also been chronically waking up at 3 am every night, doesn't seem to be from noise. And lastly she has been dealing with chronic diarrhea, possibly related to a "boost" drink? [name] states he couldn't remember what brand or type it was.  [name] would also like to extend orders out for Physical therapy. Orders being:  Once a week, for 3 more weeks  CB: [phone]

## 2021-09-07 NOTE — Patient Instructions (Signed)
Visit Information  Thank you for taking time to visit with me today. Please don't hesitate to contact me if I can be of assistance to you before our next scheduled telephone appointment.  Following are the goals we discussed today: Level of care support Patient Self-Care Activities: We completed your referral with DSS In-Home Aide program, you are on the wait list I have placed a referral [employer] for meals on wheel, this is also a long wait list they will contact you.    Our next appointment is by telephone on March 20th   Please call the care guide team at [phone] if you need to cancel or reschedule your appointment.   If you are experiencing a Mental Health or [place] or need someone to talk to, please call 1-800-273-TALK (toll free, 24 hour hotline) go to [facility] [address], [place] [phone])   Patient verbalizes understanding of instructions and care plan provided today and agrees to view in [place]. Active MyChart status confirmed with patient.    Casimer Lanius, LCSW Licensed Clinical Social Worker [facility]  [place]  [phone]

## 2021-09-08 DIAGNOSIS — M6281 Muscle weakness (generalized): Secondary | ICD-10-CM | POA: Diagnosis not present

## 2021-09-08 DIAGNOSIS — Z7951 Long term (current) use of inhaled steroids: Secondary | ICD-10-CM | POA: Diagnosis not present

## 2021-09-08 DIAGNOSIS — E785 Hyperlipidemia, unspecified: Secondary | ICD-10-CM | POA: Diagnosis not present

## 2021-09-08 DIAGNOSIS — K219 Gastro-esophageal reflux disease without esophagitis: Secondary | ICD-10-CM | POA: Diagnosis not present

## 2021-09-08 DIAGNOSIS — I700 Atherosclerosis of aorta: Secondary | ICD-10-CM | POA: Diagnosis not present

## 2021-09-08 DIAGNOSIS — E871 Hypo-osmolality and hyponatremia: Secondary | ICD-10-CM | POA: Diagnosis not present

## 2021-09-08 DIAGNOSIS — J45909 Unspecified asthma, uncomplicated: Secondary | ICD-10-CM | POA: Diagnosis not present

## 2021-09-08 DIAGNOSIS — K56609 Unspecified intestinal obstruction, unspecified as to partial versus complete obstruction: Secondary | ICD-10-CM | POA: Diagnosis not present

## 2021-09-08 DIAGNOSIS — E876 Hypokalemia: Secondary | ICD-10-CM | POA: Diagnosis not present

## 2021-09-08 DIAGNOSIS — M479 Spondylosis, unspecified: Secondary | ICD-10-CM | POA: Diagnosis not present

## 2021-09-08 DIAGNOSIS — G8929 Other chronic pain: Secondary | ICD-10-CM | POA: Diagnosis not present

## 2021-09-08 DIAGNOSIS — K449 Diaphragmatic hernia without obstruction or gangrene: Secondary | ICD-10-CM | POA: Diagnosis not present

## 2021-09-08 DIAGNOSIS — Z87440 Personal history of urinary (tract) infections: Secondary | ICD-10-CM | POA: Diagnosis not present

## 2021-09-08 DIAGNOSIS — M170 Bilateral primary osteoarthritis of knee: Secondary | ICD-10-CM | POA: Diagnosis not present

## 2021-09-08 DIAGNOSIS — Z96642 Presence of left artificial hip joint: Secondary | ICD-10-CM | POA: Diagnosis not present

## 2021-09-08 DIAGNOSIS — K56699 Other intestinal obstruction unspecified as to partial versus complete obstruction: Secondary | ICD-10-CM | POA: Diagnosis not present

## 2021-09-08 DIAGNOSIS — I10 Essential (primary) hypertension: Secondary | ICD-10-CM | POA: Diagnosis not present

## 2021-09-08 DIAGNOSIS — G629 Polyneuropathy, unspecified: Secondary | ICD-10-CM | POA: Diagnosis not present

## 2021-09-08 DIAGNOSIS — Z79899 Other long term (current) drug therapy: Secondary | ICD-10-CM | POA: Diagnosis not present

## 2021-09-08 DIAGNOSIS — Z9181 History of falling: Secondary | ICD-10-CM | POA: Diagnosis not present

## 2021-09-09 ENCOUNTER — Other Ambulatory Visit (HOSPITAL_COMMUNITY): Payer: Self-pay

## 2021-09-09 ENCOUNTER — Telehealth: Payer: Self-pay

## 2021-09-09 MED ORDER — GABAPENTIN 100 MG PO CAPS
300.0000 mg | ORAL_CAPSULE | Freq: Every day | ORAL | 5 refills | Status: DC
  Filled 2021-09-09: qty 90, 30d supply, fill #0
  Filled 2021-10-22: qty 90, 30d supply, fill #1

## 2021-09-09 NOTE — Telephone Encounter (Signed)
I'm not sure we have ever discussed her having problems swallowing just poor appetite. I would need to assess her to determine appropriate testing if needed.

## 2021-09-09 NOTE — Telephone Encounter (Signed)
Pt is calling requesting a refill on: gabapentin (NEURONTIN) 100 MG capsule  Pharmacy: [facility]  LOV 08/21/21  Pt CB [phone]  Advise pt that it can take up to 72 hrs for refill. Pt has 1 tab left for tonight

## 2021-09-09 NOTE — Telephone Encounter (Signed)
Pt declined a appt at this time.

## 2021-09-09 NOTE — Telephone Encounter (Signed)
Ok to refill? Not originally prescribed by you 

## 2021-09-10 DIAGNOSIS — K449 Diaphragmatic hernia without obstruction or gangrene: Secondary | ICD-10-CM | POA: Diagnosis not present

## 2021-09-10 DIAGNOSIS — G8929 Other chronic pain: Secondary | ICD-10-CM | POA: Diagnosis not present

## 2021-09-10 DIAGNOSIS — Z7951 Long term (current) use of inhaled steroids: Secondary | ICD-10-CM | POA: Diagnosis not present

## 2021-09-10 DIAGNOSIS — J45909 Unspecified asthma, uncomplicated: Secondary | ICD-10-CM | POA: Diagnosis not present

## 2021-09-10 DIAGNOSIS — I10 Essential (primary) hypertension: Secondary | ICD-10-CM | POA: Diagnosis not present

## 2021-09-10 DIAGNOSIS — G629 Polyneuropathy, unspecified: Secondary | ICD-10-CM | POA: Diagnosis not present

## 2021-09-10 DIAGNOSIS — Z96642 Presence of left artificial hip joint: Secondary | ICD-10-CM | POA: Diagnosis not present

## 2021-09-10 DIAGNOSIS — K219 Gastro-esophageal reflux disease without esophagitis: Secondary | ICD-10-CM | POA: Diagnosis not present

## 2021-09-10 DIAGNOSIS — E785 Hyperlipidemia, unspecified: Secondary | ICD-10-CM | POA: Diagnosis not present

## 2021-09-10 DIAGNOSIS — M170 Bilateral primary osteoarthritis of knee: Secondary | ICD-10-CM | POA: Diagnosis not present

## 2021-09-10 DIAGNOSIS — M479 Spondylosis, unspecified: Secondary | ICD-10-CM | POA: Diagnosis not present

## 2021-09-10 DIAGNOSIS — E876 Hypokalemia: Secondary | ICD-10-CM | POA: Diagnosis not present

## 2021-09-10 DIAGNOSIS — K56609 Unspecified intestinal obstruction, unspecified as to partial versus complete obstruction: Secondary | ICD-10-CM | POA: Diagnosis not present

## 2021-09-10 DIAGNOSIS — Z79899 Other long term (current) drug therapy: Secondary | ICD-10-CM | POA: Diagnosis not present

## 2021-09-10 DIAGNOSIS — E871 Hypo-osmolality and hyponatremia: Secondary | ICD-10-CM | POA: Diagnosis not present

## 2021-09-10 DIAGNOSIS — Z87440 Personal history of urinary (tract) infections: Secondary | ICD-10-CM | POA: Diagnosis not present

## 2021-09-10 DIAGNOSIS — Z9181 History of falling: Secondary | ICD-10-CM | POA: Diagnosis not present

## 2021-09-10 DIAGNOSIS — K56699 Other intestinal obstruction unspecified as to partial versus complete obstruction: Secondary | ICD-10-CM | POA: Diagnosis not present

## 2021-09-10 DIAGNOSIS — M6281 Muscle weakness (generalized): Secondary | ICD-10-CM | POA: Diagnosis not present

## 2021-09-10 DIAGNOSIS — I700 Atherosclerosis of aorta: Secondary | ICD-10-CM | POA: Diagnosis not present

## 2021-09-15 ENCOUNTER — Other Ambulatory Visit (HOSPITAL_COMMUNITY): Payer: Self-pay

## 2021-09-15 DIAGNOSIS — Z7951 Long term (current) use of inhaled steroids: Secondary | ICD-10-CM | POA: Diagnosis not present

## 2021-09-15 DIAGNOSIS — E785 Hyperlipidemia, unspecified: Secondary | ICD-10-CM | POA: Diagnosis not present

## 2021-09-15 DIAGNOSIS — Z87440 Personal history of urinary (tract) infections: Secondary | ICD-10-CM | POA: Diagnosis not present

## 2021-09-15 DIAGNOSIS — F419 Anxiety disorder, unspecified: Secondary | ICD-10-CM | POA: Diagnosis not present

## 2021-09-15 DIAGNOSIS — Z9181 History of falling: Secondary | ICD-10-CM | POA: Diagnosis not present

## 2021-09-15 DIAGNOSIS — M479 Spondylosis, unspecified: Secondary | ICD-10-CM | POA: Diagnosis not present

## 2021-09-15 DIAGNOSIS — Z79899 Other long term (current) drug therapy: Secondary | ICD-10-CM | POA: Diagnosis not present

## 2021-09-15 DIAGNOSIS — J45909 Unspecified asthma, uncomplicated: Secondary | ICD-10-CM | POA: Diagnosis not present

## 2021-09-15 DIAGNOSIS — K449 Diaphragmatic hernia without obstruction or gangrene: Secondary | ICD-10-CM | POA: Diagnosis not present

## 2021-09-15 DIAGNOSIS — E876 Hypokalemia: Secondary | ICD-10-CM | POA: Diagnosis not present

## 2021-09-15 DIAGNOSIS — G629 Polyneuropathy, unspecified: Secondary | ICD-10-CM | POA: Diagnosis not present

## 2021-09-15 DIAGNOSIS — M6281 Muscle weakness (generalized): Secondary | ICD-10-CM | POA: Diagnosis not present

## 2021-09-15 DIAGNOSIS — E871 Hypo-osmolality and hyponatremia: Secondary | ICD-10-CM | POA: Diagnosis not present

## 2021-09-15 DIAGNOSIS — K219 Gastro-esophageal reflux disease without esophagitis: Secondary | ICD-10-CM | POA: Diagnosis not present

## 2021-09-15 DIAGNOSIS — K56699 Other intestinal obstruction unspecified as to partial versus complete obstruction: Secondary | ICD-10-CM | POA: Diagnosis not present

## 2021-09-15 DIAGNOSIS — Z96642 Presence of left artificial hip joint: Secondary | ICD-10-CM | POA: Diagnosis not present

## 2021-09-15 DIAGNOSIS — G8929 Other chronic pain: Secondary | ICD-10-CM | POA: Diagnosis not present

## 2021-09-15 DIAGNOSIS — M170 Bilateral primary osteoarthritis of knee: Secondary | ICD-10-CM | POA: Diagnosis not present

## 2021-09-15 DIAGNOSIS — J4540 Moderate persistent asthma, uncomplicated: Secondary | ICD-10-CM | POA: Diagnosis not present

## 2021-09-15 DIAGNOSIS — K56609 Unspecified intestinal obstruction, unspecified as to partial versus complete obstruction: Secondary | ICD-10-CM | POA: Diagnosis not present

## 2021-09-15 DIAGNOSIS — I10 Essential (primary) hypertension: Secondary | ICD-10-CM | POA: Diagnosis not present

## 2021-09-15 DIAGNOSIS — I700 Atherosclerosis of aorta: Secondary | ICD-10-CM | POA: Diagnosis not present

## 2021-09-15 MED ORDER — XIIDRA 5 % OP SOLN
1.0000 [drp] | Freq: Two times a day (BID) | OPHTHALMIC | 3 refills | Status: DC
  Filled 2021-09-15: qty 60, 30d supply, fill #0

## 2021-09-16 DIAGNOSIS — K56699 Other intestinal obstruction unspecified as to partial versus complete obstruction: Secondary | ICD-10-CM | POA: Diagnosis not present

## 2021-09-16 DIAGNOSIS — Z7951 Long term (current) use of inhaled steroids: Secondary | ICD-10-CM | POA: Diagnosis not present

## 2021-09-16 DIAGNOSIS — G8929 Other chronic pain: Secondary | ICD-10-CM | POA: Diagnosis not present

## 2021-09-16 DIAGNOSIS — M170 Bilateral primary osteoarthritis of knee: Secondary | ICD-10-CM | POA: Diagnosis not present

## 2021-09-16 DIAGNOSIS — E871 Hypo-osmolality and hyponatremia: Secondary | ICD-10-CM | POA: Diagnosis not present

## 2021-09-16 DIAGNOSIS — Z87440 Personal history of urinary (tract) infections: Secondary | ICD-10-CM | POA: Diagnosis not present

## 2021-09-16 DIAGNOSIS — K56609 Unspecified intestinal obstruction, unspecified as to partial versus complete obstruction: Secondary | ICD-10-CM | POA: Diagnosis not present

## 2021-09-16 DIAGNOSIS — I700 Atherosclerosis of aorta: Secondary | ICD-10-CM | POA: Diagnosis not present

## 2021-09-16 DIAGNOSIS — K219 Gastro-esophageal reflux disease without esophagitis: Secondary | ICD-10-CM | POA: Diagnosis not present

## 2021-09-16 DIAGNOSIS — M479 Spondylosis, unspecified: Secondary | ICD-10-CM | POA: Diagnosis not present

## 2021-09-16 DIAGNOSIS — K449 Diaphragmatic hernia without obstruction or gangrene: Secondary | ICD-10-CM | POA: Diagnosis not present

## 2021-09-16 DIAGNOSIS — G629 Polyneuropathy, unspecified: Secondary | ICD-10-CM | POA: Diagnosis not present

## 2021-09-16 DIAGNOSIS — Z79899 Other long term (current) drug therapy: Secondary | ICD-10-CM | POA: Diagnosis not present

## 2021-09-16 DIAGNOSIS — E785 Hyperlipidemia, unspecified: Secondary | ICD-10-CM | POA: Diagnosis not present

## 2021-09-16 DIAGNOSIS — I10 Essential (primary) hypertension: Secondary | ICD-10-CM | POA: Diagnosis not present

## 2021-09-16 DIAGNOSIS — M6281 Muscle weakness (generalized): Secondary | ICD-10-CM | POA: Diagnosis not present

## 2021-09-16 DIAGNOSIS — E876 Hypokalemia: Secondary | ICD-10-CM | POA: Diagnosis not present

## 2021-09-16 DIAGNOSIS — J45909 Unspecified asthma, uncomplicated: Secondary | ICD-10-CM | POA: Diagnosis not present

## 2021-09-16 DIAGNOSIS — Z96642 Presence of left artificial hip joint: Secondary | ICD-10-CM | POA: Diagnosis not present

## 2021-09-16 DIAGNOSIS — Z9181 History of falling: Secondary | ICD-10-CM | POA: Diagnosis not present

## 2021-09-17 DIAGNOSIS — K56609 Unspecified intestinal obstruction, unspecified as to partial versus complete obstruction: Secondary | ICD-10-CM | POA: Diagnosis not present

## 2021-09-17 DIAGNOSIS — Z9181 History of falling: Secondary | ICD-10-CM | POA: Diagnosis not present

## 2021-09-17 DIAGNOSIS — J45909 Unspecified asthma, uncomplicated: Secondary | ICD-10-CM | POA: Diagnosis not present

## 2021-09-17 DIAGNOSIS — K219 Gastro-esophageal reflux disease without esophagitis: Secondary | ICD-10-CM | POA: Diagnosis not present

## 2021-09-17 DIAGNOSIS — G8929 Other chronic pain: Secondary | ICD-10-CM | POA: Diagnosis not present

## 2021-09-17 DIAGNOSIS — K449 Diaphragmatic hernia without obstruction or gangrene: Secondary | ICD-10-CM | POA: Diagnosis not present

## 2021-09-17 DIAGNOSIS — I10 Essential (primary) hypertension: Secondary | ICD-10-CM | POA: Diagnosis not present

## 2021-09-17 DIAGNOSIS — E876 Hypokalemia: Secondary | ICD-10-CM | POA: Diagnosis not present

## 2021-09-17 DIAGNOSIS — E871 Hypo-osmolality and hyponatremia: Secondary | ICD-10-CM | POA: Diagnosis not present

## 2021-09-17 DIAGNOSIS — Z7951 Long term (current) use of inhaled steroids: Secondary | ICD-10-CM | POA: Diagnosis not present

## 2021-09-17 DIAGNOSIS — M170 Bilateral primary osteoarthritis of knee: Secondary | ICD-10-CM | POA: Diagnosis not present

## 2021-09-17 DIAGNOSIS — G629 Polyneuropathy, unspecified: Secondary | ICD-10-CM | POA: Diagnosis not present

## 2021-09-17 DIAGNOSIS — K56699 Other intestinal obstruction unspecified as to partial versus complete obstruction: Secondary | ICD-10-CM | POA: Diagnosis not present

## 2021-09-17 DIAGNOSIS — Z79899 Other long term (current) drug therapy: Secondary | ICD-10-CM | POA: Diagnosis not present

## 2021-09-17 DIAGNOSIS — Z96642 Presence of left artificial hip joint: Secondary | ICD-10-CM | POA: Diagnosis not present

## 2021-09-17 DIAGNOSIS — E785 Hyperlipidemia, unspecified: Secondary | ICD-10-CM | POA: Diagnosis not present

## 2021-09-17 DIAGNOSIS — M6281 Muscle weakness (generalized): Secondary | ICD-10-CM | POA: Diagnosis not present

## 2021-09-17 DIAGNOSIS — I700 Atherosclerosis of aorta: Secondary | ICD-10-CM | POA: Diagnosis not present

## 2021-09-17 DIAGNOSIS — M479 Spondylosis, unspecified: Secondary | ICD-10-CM | POA: Diagnosis not present

## 2021-09-17 DIAGNOSIS — Z87440 Personal history of urinary (tract) infections: Secondary | ICD-10-CM | POA: Diagnosis not present

## 2021-09-18 ENCOUNTER — Other Ambulatory Visit: Payer: Self-pay

## 2021-09-18 ENCOUNTER — Ambulatory Visit (INDEPENDENT_AMBULATORY_CARE_PROVIDER_SITE_OTHER): Payer: Medicare Other | Admitting: Internal Medicine

## 2021-09-18 ENCOUNTER — Encounter: Payer: Self-pay | Admitting: Internal Medicine

## 2021-09-18 DIAGNOSIS — R1310 Dysphagia, unspecified: Secondary | ICD-10-CM | POA: Insufficient documentation

## 2021-09-18 DIAGNOSIS — F419 Anxiety disorder, unspecified: Secondary | ICD-10-CM | POA: Diagnosis not present

## 2021-09-18 DIAGNOSIS — K219 Gastro-esophageal reflux disease without esophagitis: Secondary | ICD-10-CM | POA: Diagnosis not present

## 2021-09-18 DIAGNOSIS — R1319 Other dysphagia: Secondary | ICD-10-CM | POA: Diagnosis not present

## 2021-09-18 NOTE — Patient Instructions (Addendum)
We will have you get in with the GI doctor for the swallowing and in the meantime stay with a soft diet and chew foods well with small bites. ? ?Start taking the protonix twice a day for the next month to see if this will help more. ? ? ?

## 2021-09-18 NOTE — Assessment & Plan Note (Signed)
Sounds to be potential narrowing esophagus/stricture. She has hiatal hernia on CT scan and some GERD. She is taking protonix 40 mg daily (prescribed BID). Asked her to take this 40 mg BID protonix on a schedule for 1 month to see if this helps. She is advised to chew bites well as her symptoms started about 2 months ago and with meats and breads mostly. No dysphagia with liquids. She is already scheduled to return to GI and asked to discuss this with them.  ?

## 2021-09-18 NOTE — Assessment & Plan Note (Signed)
Anxiety about her GI issues does keep her from eating much and is impacting her life severely. She is using clonazepam BID 0.5 mg and does not have a desire to change treatment at this time due to bad reaction with zoloft in the past.  ?

## 2021-09-18 NOTE — Progress Notes (Signed)
? ?  Subjective:  ? ?Patient ID: Tina Riley, female    DOB: 1932/05/09, 88 y.o.   MRN: [id number] ? ?HPI ?The patient is an 88 YO female coming in for GI issues and problems swallowing.  ? ?Review of Systems  ?Constitutional: Negative.   ?HENT:  Positive for trouble swallowing.   ?Eyes:  Positive for visual disturbance.  ?Respiratory:  Negative for cough, chest tightness and shortness of breath.   ?Cardiovascular:  Negative for chest pain, palpitations and leg swelling.  ?Gastrointestinal:  Positive for constipation and diarrhea. Negative for abdominal distention, abdominal pain, nausea and vomiting.  ?Musculoskeletal: Negative.   ?Skin: Negative.   ?Neurological: Negative.   ?Psychiatric/Behavioral: Negative.    ? ?Objective:  ?Physical Exam ?Constitutional:   ?   Appearance: She is well-developed.  ?   Comments: Thin  ?HENT:  ?   Head: Normocephalic and atraumatic.  ?Cardiovascular:  ?   Rate and Rhythm: Normal rate and regular rhythm.  ?Pulmonary:  ?   Effort: Pulmonary effort is normal. No respiratory distress.  ?   Breath sounds: Normal breath sounds. No wheezing or rales.  ?Abdominal:  ?   General: Bowel sounds are normal. There is no distension.  ?   Palpations: Abdomen is soft.  ?   Tenderness: There is no abdominal tenderness. There is no rebound.  ?Musculoskeletal:  ?   Cervical back: Normal range of motion.  ?Skin: ?   General: Skin is warm and dry.  ?Neurological:  ?   Mental Status: She is alert and oriented to person, place, and time.  ?   Coordination: Coordination normal.  ? ? ?Vitals:  ? 09/18/21 1415  ?BP: 120/78  ?Pulse: 64  ?Resp: 18  ?SpO2: 99%  ?Weight: 98 lb 6.4 oz (44.6 kg)  ?Height: 4\' 11"  (1.499 m)  ? ? ?This visit occurred during the SARS-CoV-2 public health emergency.  Safety protocols were in place, including screening questions prior to the visit, additional usage of staff PPE, and extensive cleaning of exam room while observing appropriate contact time as indicated for  disinfecting solutions.  ? ?Assessment & Plan:  ?Visit time 25 minutes in face to face communication with patient and coordination of care, additional 15 minutes spent in record review, coordination or care, ordering tests, communicating/referring to other healthcare professionals, documenting in medical records all on the same day of the visit for total time 40 minutes spent on the visit.  ? ?

## 2021-09-18 NOTE — Assessment & Plan Note (Signed)
She is taking protonix 40 mg daily. In light of the new dysphagia advised to take this BID scheduled for 1 month.  ?

## 2021-09-21 ENCOUNTER — Other Ambulatory Visit (HOSPITAL_COMMUNITY): Payer: Self-pay

## 2021-09-22 DIAGNOSIS — I700 Atherosclerosis of aorta: Secondary | ICD-10-CM | POA: Diagnosis not present

## 2021-09-22 DIAGNOSIS — J45909 Unspecified asthma, uncomplicated: Secondary | ICD-10-CM | POA: Diagnosis not present

## 2021-09-22 DIAGNOSIS — M6281 Muscle weakness (generalized): Secondary | ICD-10-CM | POA: Diagnosis not present

## 2021-09-22 DIAGNOSIS — E785 Hyperlipidemia, unspecified: Secondary | ICD-10-CM | POA: Diagnosis not present

## 2021-09-22 DIAGNOSIS — E871 Hypo-osmolality and hyponatremia: Secondary | ICD-10-CM | POA: Diagnosis not present

## 2021-09-22 DIAGNOSIS — Z87440 Personal history of urinary (tract) infections: Secondary | ICD-10-CM | POA: Diagnosis not present

## 2021-09-22 DIAGNOSIS — Z96642 Presence of left artificial hip joint: Secondary | ICD-10-CM | POA: Diagnosis not present

## 2021-09-22 DIAGNOSIS — M170 Bilateral primary osteoarthritis of knee: Secondary | ICD-10-CM | POA: Diagnosis not present

## 2021-09-22 DIAGNOSIS — E876 Hypokalemia: Secondary | ICD-10-CM | POA: Diagnosis not present

## 2021-09-22 DIAGNOSIS — Z7951 Long term (current) use of inhaled steroids: Secondary | ICD-10-CM | POA: Diagnosis not present

## 2021-09-22 DIAGNOSIS — K449 Diaphragmatic hernia without obstruction or gangrene: Secondary | ICD-10-CM | POA: Diagnosis not present

## 2021-09-22 DIAGNOSIS — K56609 Unspecified intestinal obstruction, unspecified as to partial versus complete obstruction: Secondary | ICD-10-CM | POA: Diagnosis not present

## 2021-09-22 DIAGNOSIS — Z9181 History of falling: Secondary | ICD-10-CM | POA: Diagnosis not present

## 2021-09-22 DIAGNOSIS — K219 Gastro-esophageal reflux disease without esophagitis: Secondary | ICD-10-CM | POA: Diagnosis not present

## 2021-09-22 DIAGNOSIS — K56699 Other intestinal obstruction unspecified as to partial versus complete obstruction: Secondary | ICD-10-CM | POA: Diagnosis not present

## 2021-09-22 DIAGNOSIS — G8929 Other chronic pain: Secondary | ICD-10-CM | POA: Diagnosis not present

## 2021-09-22 DIAGNOSIS — Z79899 Other long term (current) drug therapy: Secondary | ICD-10-CM | POA: Diagnosis not present

## 2021-09-22 DIAGNOSIS — M479 Spondylosis, unspecified: Secondary | ICD-10-CM | POA: Diagnosis not present

## 2021-09-22 DIAGNOSIS — G629 Polyneuropathy, unspecified: Secondary | ICD-10-CM | POA: Diagnosis not present

## 2021-09-22 DIAGNOSIS — I10 Essential (primary) hypertension: Secondary | ICD-10-CM | POA: Diagnosis not present

## 2021-09-30 ENCOUNTER — Telehealth: Payer: Self-pay

## 2021-09-30 DIAGNOSIS — G629 Polyneuropathy, unspecified: Secondary | ICD-10-CM | POA: Diagnosis not present

## 2021-09-30 DIAGNOSIS — Z7951 Long term (current) use of inhaled steroids: Secondary | ICD-10-CM | POA: Diagnosis not present

## 2021-09-30 DIAGNOSIS — E871 Hypo-osmolality and hyponatremia: Secondary | ICD-10-CM | POA: Diagnosis not present

## 2021-09-30 DIAGNOSIS — Z87440 Personal history of urinary (tract) infections: Secondary | ICD-10-CM | POA: Diagnosis not present

## 2021-09-30 DIAGNOSIS — I10 Essential (primary) hypertension: Secondary | ICD-10-CM | POA: Diagnosis not present

## 2021-09-30 DIAGNOSIS — K449 Diaphragmatic hernia without obstruction or gangrene: Secondary | ICD-10-CM | POA: Diagnosis not present

## 2021-09-30 DIAGNOSIS — K56609 Unspecified intestinal obstruction, unspecified as to partial versus complete obstruction: Secondary | ICD-10-CM | POA: Diagnosis not present

## 2021-09-30 DIAGNOSIS — M170 Bilateral primary osteoarthritis of knee: Secondary | ICD-10-CM | POA: Diagnosis not present

## 2021-09-30 DIAGNOSIS — M6281 Muscle weakness (generalized): Secondary | ICD-10-CM | POA: Diagnosis not present

## 2021-09-30 DIAGNOSIS — J45909 Unspecified asthma, uncomplicated: Secondary | ICD-10-CM | POA: Diagnosis not present

## 2021-09-30 DIAGNOSIS — I700 Atherosclerosis of aorta: Secondary | ICD-10-CM | POA: Diagnosis not present

## 2021-09-30 DIAGNOSIS — K219 Gastro-esophageal reflux disease without esophagitis: Secondary | ICD-10-CM | POA: Diagnosis not present

## 2021-09-30 DIAGNOSIS — K56699 Other intestinal obstruction unspecified as to partial versus complete obstruction: Secondary | ICD-10-CM | POA: Diagnosis not present

## 2021-09-30 DIAGNOSIS — Z9181 History of falling: Secondary | ICD-10-CM | POA: Diagnosis not present

## 2021-09-30 DIAGNOSIS — G8929 Other chronic pain: Secondary | ICD-10-CM | POA: Diagnosis not present

## 2021-09-30 DIAGNOSIS — Z79899 Other long term (current) drug therapy: Secondary | ICD-10-CM | POA: Diagnosis not present

## 2021-09-30 DIAGNOSIS — Z96642 Presence of left artificial hip joint: Secondary | ICD-10-CM | POA: Diagnosis not present

## 2021-09-30 DIAGNOSIS — E785 Hyperlipidemia, unspecified: Secondary | ICD-10-CM | POA: Diagnosis not present

## 2021-09-30 DIAGNOSIS — E876 Hypokalemia: Secondary | ICD-10-CM | POA: Diagnosis not present

## 2021-09-30 DIAGNOSIS — M479 Spondylosis, unspecified: Secondary | ICD-10-CM | POA: Diagnosis not present

## 2021-09-30 NOTE — Telephone Encounter (Signed)
Pt is d/c for [facility] services and there are some concerns: ?Pt reports daily diarrhea continues for over a month ?Red shiny skin on ankles and feet for over 2 years ?Recently +1 Edema in ankles  ?Still continues with poor sleep. ?Pt reports some of issues is uncomfortable talking about in front of POA. Maybe at next visit speak alone.  ? ?FYI ? ?Herbert Deaner states that he doesn't need a call back unless you have questions. ? ?Reach out to Pt or POA to address these concerns ?

## 2021-10-01 NOTE — Telephone Encounter (Signed)
Noted  

## 2021-10-02 ENCOUNTER — Telehealth: Payer: Self-pay | Admitting: Internal Medicine

## 2021-10-02 NOTE — Telephone Encounter (Signed)
Pts niece Lupita Leash states pt is complaining of stomach cramps and being cold since 3-16 ? ?Caller also states pt vomited this morning 3-17 ? ?Offered caller an appt for pt, caller declined stating she just wants recommendations on how pt should care for symptoms ? ?Please call [phone]- Lupita Leash ?

## 2021-10-02 NOTE — Telephone Encounter (Signed)
Inbound call from patient niece Lupita Leash. Reports patient ate something last night that cause her to have frequent bowel movement throughout the night and had 1 vomit episode this morning. Best contact number [phone] ?

## 2021-10-02 NOTE — Telephone Encounter (Signed)
Per Dr. Okey Dupre pt needs to follow up with her GI doctor.  ? ?Tina Riley with Dr. Frutoso Chase recommendations. She verbalized understanding, no questions or concerns.  ?

## 2021-10-02 NOTE — Telephone Encounter (Signed)
Left message for pt niece Tina Riley to call back:   ?

## 2021-10-05 ENCOUNTER — Telehealth: Payer: Medicare Other

## 2021-10-05 NOTE — Telephone Encounter (Signed)
Left message for pt niece Butch Penny to call back:   ?

## 2021-10-06 NOTE — Telephone Encounter (Signed)
OK by me to switch to female gastroenterologist ? ? ?

## 2021-10-06 NOTE — Telephone Encounter (Signed)
Unfortunately I cannot accommodate the request for transfer, I don't have any office appointment available for several weeks ?

## 2021-10-06 NOTE — Telephone Encounter (Signed)
Pt niece Lupita Leash stated that pt is feeling better from the other day: Lupita Leash stated that she is requesting to set up an office Visit with a Female Dr. Only for off and on diarrhea and difficulty swallowing at times: Lupita Leash was notified that we would have to reach out to Dr. Leone Payor to make him aware and reach out to the Female Dr.'s in our practice for transfer of care:  ?Lupita Leash  verbalized understanding with all questions answered.  ?Please review and Advise ? ?

## 2021-10-12 NOTE — Telephone Encounter (Signed)
Pt was scheduled with Dr. Nicole Kindred on 10/21/2021 at 3:00: Patient niece Lupita Leash made aware: ?Lupita Leash verbalized understanding with all questions answered.  ? ?

## 2021-10-13 ENCOUNTER — Encounter: Payer: Self-pay | Admitting: Licensed Clinical Social Worker

## 2021-10-13 ENCOUNTER — Ambulatory Visit (INDEPENDENT_AMBULATORY_CARE_PROVIDER_SITE_OTHER): Payer: Medicare Other | Admitting: Licensed Clinical Social Worker

## 2021-10-13 DIAGNOSIS — J4540 Moderate persistent asthma, uncomplicated: Secondary | ICD-10-CM

## 2021-10-13 DIAGNOSIS — F419 Anxiety disorder, unspecified: Secondary | ICD-10-CM

## 2021-10-13 DIAGNOSIS — F5101 Primary insomnia: Secondary | ICD-10-CM

## 2021-10-13 NOTE — Patient Instructions (Signed)
Visit Information ? ?Thank you for taking time to visit with me today. Please don't hesitate to contact me if I can be of assistance to you before our next scheduled telephone appointment. ? ?Following are the goals we discussed today: managing symptoms of anxiety and levels of care ?Patient Self-Care Activities: ?You are on the wait list fo DSS In-Home Aide program [phone] ?We discussed Long -term care and importance of exploring options. I have included a brochure on Special Assistance and a list of assisted living facilities. ?I have place a referral to [facility], per your request you would like them to contact [name] to schedule your appointment.  ?You may call them if no one has contact you in 1 week  [phone] ? ?Our next appointment is by telephone on April 24th at 2:30 ? ?Please call the care guide team at [phone] if you need to cancel or reschedule your appointment.  ? ?If you are experiencing a Mental Health or Behavioral Health Crisis or need someone to talk to, please call 1-800-273-TALK (toll free, 24 hour hotline)  ? ?The patient verbalized understanding of instructions, educational materials, and care plan provided today and agreed to receive a mailed copy of patient instructions, educational materials, and care plan.  ? ?Sammuel Hines, LCSW ?Licensed Clinical Social Worker [facility]  ?[facility]  ?[phone]   ?

## 2021-10-13 NOTE — Chronic Care Management (AMB) (Signed)
?Chronic Care Management  ? Clinical Social Work Note ? ?10/13/2021 ?Name: Tina Riley MRN: [id number] DOB: [birth year] ? ?Tina Riley is a 88 y.o. year old female who is a primary care patient of Tina Dupre Austin Miles, MD. The CCM team was consulted to assist the patient with chronic disease management and/or care coordination needs related to: Level of Care Concerns, Mental Health Counseling and Resources, and long-term care planning .  ? ?Engaged with patient by telephone for follow up visit in response to provider referral for social work chronic care management and care coordination services.  ? ?Consent to Services:  ?The patient was given information about Chronic Care Management services, agreed to services, and gave verbal consent prior to initiation of services.  Please see initial visit note for detailed documentation.  ?Patient agreed to services and consent obtained.  ? ?Summary: Patient was accompanied by her nephew Tina Riley who provided information during this encounter. Patient continues to experience difficulty with managing symptoms of anxiety. She is making progress and is about 30 days out with meals on wheels home assessment..  See Care Plan below for interventions and patient self-care actives. ? ?Recommendation: Patient may benefit from, and is in agreement to allow LCSW to make a referral for therapy and willing to review long-term care information.  ? ?Follow up Plan: Patient would like continued follow-up from CCM LCSW.  per patient's request will follow up in 30 days.  Will call office if needed prior to next encounter. ?  ?Assessment: Review of patient past medical history, allergies, medications, and health status, including review of relevant consultants reports was performed today as part of a comprehensive evaluation and provision of chronic care management and care coordination services.    ? ?SDOH (Social Determinants of Health) assessments and interventions  performed:  ?SDOH Interventions   ? ?Flowsheet Row Most Recent Value  ?SDOH Interventions   ?Stress Interventions Other (Comment)  [referral for counseling]  ? ?  ?  ? ?Advanced Directives Status: See Vynca application for related entries. ? ?CCM Care Plan ?Conditions to be addressed/monitored: Anxiety; Level of care concerns ? ?Care Plan : LCSW Plan of Care  ?Updates made by Tina Pilon, LCSW since 10/13/2021 12:00 AM  ?  ? ?Problem: Mobility and Independence   ?  ? ?Long-Range Goal: Mobility and Independence Optimized by addtional home support   ?Start Date: 08/04/2021  ?This Visit's Progress: On track  ?Recent Progress: On track  ?Priority: High  ?Note:   ?Current Barriers:  ?Level of Care Concerns:Inability to perform ADL's independently ? ?CSW Clinical Goal(s):  ?Patient  will work with resources discussed to address needs related to level of care and home needs  through collaboration with Clinical Social Worker, provider, and care team.  ? ?Interventions: ?1:1 collaboration with primary care provider regarding development and update of comprehensive plan of care as evidenced by provider attestation and co-signature ?Inter-disciplinary care team collaboration (see longitudinal plan of care) ?Evaluation of current treatment plan related to  self management and patient's adherence to plan as established by provider ?Review resources, discussed options and provided patient information about  ?Department of Social Services ( on wait list for in-home aide program [phone] ) ?Referral for [facility] (meals on wheels pending anticipate in 30 days) ?Services provided by [employer]  ? ? Level of Care Concerns in a patient with Anxiety, GERD, and Asthma:  (Status: Goal on Track (progressing): YES.) ?Current level of care: home, alone and support system is  nephew  ?Evaluation of patient safety in current living environment:  ?Solution-Focused Strategies employed:  ?Active listening / Reflection utilized   ?Problem Solving /Task Center strategies reviewed ?Discussed Long-term care ? ?Mental Health:  (Status: New goal.) ?Evaluation of current treatment plan related to Anxiety with Excessive Worry, ?Motivational Interviewing employed ?Provided psychoeducation for mental health needs  ?Participation in counseling encouraged  ?Discussed treatment options for managing anxiety ?Made referral to [facility] ? ?Patient Self-Care Activities: ?You are on the wait list fo DSS In-Home Aide program [phone] ?We discussed Long -term care and importance of exploring options. I have included a brochure on Special Assistance and a list of assisted living facilities. ?I have place a referral to [facility], per your request you would like them to contact Tina Riley to schedule your appointment.  ?You may call them if no one has contact you in 1 week  [phone] ?  ?  ? ?Tina Hines, LCSW ?Licensed Clinical Social Worker Tina Riley Management  ?[facility]  ?[phone]  ? ? ?

## 2021-10-16 DIAGNOSIS — F419 Anxiety disorder, unspecified: Secondary | ICD-10-CM | POA: Diagnosis not present

## 2021-10-16 DIAGNOSIS — J4540 Moderate persistent asthma, uncomplicated: Secondary | ICD-10-CM

## 2021-10-21 ENCOUNTER — Ambulatory Visit: Payer: Medicare Other | Admitting: Internal Medicine

## 2021-10-21 ENCOUNTER — Other Ambulatory Visit (HOSPITAL_COMMUNITY): Payer: Self-pay

## 2021-10-21 ENCOUNTER — Encounter: Payer: Self-pay | Admitting: Internal Medicine

## 2021-10-21 VITALS — BP 134/78 | HR 70 | Ht 59.0 in | Wt 94.0 lb

## 2021-10-21 DIAGNOSIS — K589 Irritable bowel syndrome without diarrhea: Secondary | ICD-10-CM

## 2021-10-21 DIAGNOSIS — R197 Diarrhea, unspecified: Secondary | ICD-10-CM

## 2021-10-21 DIAGNOSIS — R634 Abnormal weight loss: Secondary | ICD-10-CM | POA: Diagnosis not present

## 2021-10-21 MED ORDER — RIFAXIMIN 550 MG PO TABS
550.0000 mg | ORAL_TABLET | Freq: Two times a day (BID) | ORAL | 0 refills | Status: DC
  Filled 2021-10-21 – 2021-10-23 (×5): qty 28, 14d supply, fill #0

## 2021-10-21 NOTE — Progress Notes (Signed)
? ?Chief Complaint: Diarrhea ? ?HPI : 88 year old female with history of asthma, IBS, GERD and peripheral neuropathy presents with diarrhea ? ?Interval History: She has had 3 episodes of diarrhea today. The diarrhea is not as loose as it used to be.  She stopped consuming milk products in order to try to reduce the amount of diarrhea that she is experiencing.  After she developed a small bowel obstruction in 07/2021, she was taken off of all her Imodium and Bentyl medication.  She did try fiber supplementation as instructed by Dr. Carlean Purl, but did not find any improvement in her symptoms with the fiber.  She has been trying to be diligent about figuring out what types of foods seem to make her diarrheal symptoms worse.  She does have issues with fecal incontinence as a result of her diarrhea.  This has limited her ability to be able to get out of the house due to fear of having incontinence.  On average she has about 2-3 bowel movements per day.  Her nephew was with her at her appointment today and helps take her to appointments.  She does note that she has been losing some weight over the last few months.  She has a poor appetite.  After her discharge from the hospital recently, she was instructed to continue boost at home, but then stopped this after reading the nutrition label on the boost medication.  She does have history of cholecystectomy, but she states that her diarrhea preceded her cholecystectomy. ? ?Wt Readings from Last 3 Encounters:  ?10/21/21 94 lb (42.6 kg)  ?09/18/21 98 lb 6.4 oz (44.6 kg)  ?08/21/21 101 lb 9.6 oz (46.1 kg)  ? ?Current Outpatient Medications  ?Medication Sig Dispense Refill  ? acetaminophen (TYLENOL) 500 MG tablet Take 500 mg by mouth daily as needed for headache or mild pain.    ? albuterol (VENTOLIN HFA) 108 (90 Base) MCG/ACT inhaler Inhale 2 puffs into the lungs every 6 (six) hours as needed for wheezing or shortness of breath. 8.5 g 2  ? clonazePAM (KLONOPIN) 0.5 MG tablet Take  1 tablet (0.5 mg total) by mouth 2 (two) times daily as needed for anxiety 60 tablet 5  ? gabapentin (NEURONTIN) 100 MG capsule Take 3 capsules (300 mg total) by mouth at bedtime. 90 capsule 5  ? levocetirizine (XYZAL) 5 MG tablet Take 1 tablet (5 mg total) by mouth daily. 90 tablet 3  ? Nutritional Supplements (FEEDING SUPPLEMENT, BOOST BREEZE,) LIQD Take 237 mLs by mouth daily. 2370 mL 3  ? pantoprazole (PROTONIX) 40 MG tablet Take 1 tablet (40 mg total) by mouth 2 (two) times daily. (Patient taking differently: Take 40 mg by mouth daily.) 180 tablet 3  ? propranolol ER (INDERAL LA) 60 MG 24 hr capsule Take 1 capsule (60 mg total) by mouth daily. 90 capsule 1  ? rosuvastatin (CRESTOR) 5 MG tablet Take 1 tablet (5 mg total) by mouth at bedtime. 90 tablet 3  ? SYMBICORT 160-4.5 MCG/ACT inhaler Inhale 2 puffs into the lungs 2 (two) times daily. 10.2 g 11  ? valACYclovir (VALTREX) 1000 MG tablet TAKE 1 TABLET (1,000 MG TOTAL) BY MOUTH DAILY. (Patient taking differently: Take 1,000 mg by mouth at bedtime.) 90 tablet 3  ? XIIDRA 5 % SOLN Place 1 drop into both eyes 2 (two) times daily. 180 each 3  ? ?No current facility-administered medications for this visit.  ? ?Review of Systems: ?All systems reviewed and negative except where noted in  HPI.  ? ?Physical Exam: ?BP 134/78   Pulse 70   Ht 4\' 11"  (1.499 m)   Wt 94 lb (42.6 kg)   BMI 18.99 kg/m?  ?Constitutional: Pleasant,well-developed, female in no acute distress. ?HEENT: Normocephalic and atraumatic. Conjunctivae are normal. No scleral icterus. ?Cardiovascular: Normal rate, regular rhythm.  ?Pulmonary/chest: Effort normal and breath sounds normal. No wheezing, rales or rhonchi. ?Abdominal: Soft, nondistended, nontender. Bowel sounds active throughout. There are no masses palpable. No hepatomegaly. ?Extremities: No edema ?Neurological: Alert and oriented to person place and time. ?Skin: Skin is warm and dry. No rashes noted. ?Psychiatric: Normal mood and affect.  Behavior is normal. ? ?Labs 07/2021: BMP with low sodium of 132 and elevated glucose of 242. CBC with stable Hb of 12.3.  ? ?CT A/P w/contrast 07/28/21: ?IMPRESSION: ?1. No acute intra-abdominal or pelvic pathology. ?2. Severe sigmoid diverticulosis. No bowel obstruction. ?3. Aortic Atherosclerosis (ICD10-I70.0). ? ?CT A/P w/contrast 08/10/21: ?IMPRESSION: ?1. 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 Negative for intramural air or ?free air. ?2. Patient is status post partial colectomy. Diverticular disease of ?residual left colon without acute wall thickening ?3. Status post cholecystectomy. Intra and extrahepatic biliary ?dilatation probably due to postsurgical change ? ?ASSESSMENT AND PLAN: ? ?IBS ?Diarrhea ?Weight loss ?History of SBO ?Patient presents for follow-up of IBS with diarrhea.  She has also had some weight loss due to poor appetite.  Patient is limited in the medications she can take for treatment of her diarrhea due to her history of SBO.  We will educate the patient on a low FODMAP diet to see if she can identify some more food triggers that could be causing her diarrhea symptoms.  We will go ahead and try the patient on a 14-day course of rifaximin to see if this would help with her diarrheal symptoms.  I did offer the patient a referral to pelvic floor physical therapy, but she is not interested at this time.  She states that her care is limited by the availability of her nephew to take her to appointments, and she also has limited resources to be able to pay for medications and appointments. ?- Low FODMAP diet ?- Rifaximin 14 day course. If not covered, then an do a trial of colestipol medication ?- Start nutritional supplement, Boost or Ensure ?- RTC in 2 months ? ?Christia Reading, MD ? ?I spent 46 minutes of time, including in depth chart review, independent review of results as  outlined above, communicating results with the patient directly, face-to-face time with the patient, coordinating care, ordering studies and medications as appropriate, and documentation. ? ?

## 2021-10-21 NOTE — Patient Instructions (Signed)
If you are age 86 or older, your body mass index should be between 23-30. Your Body mass index is 18.99 kg/m?Marland Kitchen If this is out of the aforementioned range listed, please consider follow up with your Primary Care Provider. ? ?If you are age 75 or younger, your body mass index should be between 19-25. Your Body mass index is 18.99 kg/m?Marland Kitchen If this is out of the aformentioned range listed, please consider follow up with your Primary Care Provider.  ? ?We have sent the following medications to your pharmacy for you to pick up at your convenience:Xifixan 550 mg three times daily for 14 days. ? ?Use Boost or Ensure to prevent weight loss. ? ?The [facility] providers would like to encourage you to use [facility] to communicate with providers for non-urgent requests or questions.  Due to long hold times on the telephone, sending your provider a message by [facility] may be a faster and more efficient way to get a response.  Please allow 48 business hours for a response.  Please remember that this is for non-urgent requests.  ? ?It was a pleasure to see you today! ? ?Thank you for trusting me with your gastrointestinal care!   ? ?Eulah Pont, MD  ? ?

## 2021-10-22 ENCOUNTER — Other Ambulatory Visit (HOSPITAL_COMMUNITY): Payer: Self-pay

## 2021-10-23 ENCOUNTER — Other Ambulatory Visit (HOSPITAL_COMMUNITY): Payer: Self-pay

## 2021-10-27 ENCOUNTER — Other Ambulatory Visit (HOSPITAL_COMMUNITY): Payer: Self-pay

## 2021-10-27 ENCOUNTER — Other Ambulatory Visit: Payer: Self-pay

## 2021-10-27 ENCOUNTER — Telehealth: Payer: Self-pay

## 2021-10-27 MED ORDER — RIFAXIMIN 550 MG PO TABS
550.0000 mg | ORAL_TABLET | Freq: Three times a day (TID) | ORAL | 0 refills | Status: DC
  Filled 2021-10-27 (×2): qty 42, 14d supply, fill #0

## 2021-10-27 NOTE — Telephone Encounter (Signed)
Lm regarding samples of Xifaxan ?

## 2021-10-27 NOTE — Telephone Encounter (Signed)
Spoke with patient and explained I had samples of Xifaxan for her to take per Dr. Leonides Schanz - 3 times a day for two weeks.  She is going to have her nephew come by to pick them up ?

## 2021-10-28 ENCOUNTER — Telehealth: Payer: Self-pay

## 2021-10-28 NOTE — Telephone Encounter (Signed)
PA for Xifaxan BID for 14 days: ? ? [id number]. XIFAXAN TAB 550MG  is approved through 11/10/2021. ?

## 2021-10-28 NOTE — Telephone Encounter (Signed)
Pt's nephew picked up samples today.  ?

## 2021-11-02 ENCOUNTER — Encounter: Payer: Self-pay | Admitting: Internal Medicine

## 2021-11-02 ENCOUNTER — Ambulatory Visit (INDEPENDENT_AMBULATORY_CARE_PROVIDER_SITE_OTHER): Payer: Medicare Other | Admitting: Internal Medicine

## 2021-11-02 VITALS — BP 122/80 | HR 67 | Resp 18 | Ht 59.0 in | Wt 97.2 lb

## 2021-11-02 DIAGNOSIS — R630 Anorexia: Secondary | ICD-10-CM

## 2021-11-02 DIAGNOSIS — K591 Functional diarrhea: Secondary | ICD-10-CM

## 2021-11-02 DIAGNOSIS — E876 Hypokalemia: Secondary | ICD-10-CM

## 2021-11-02 DIAGNOSIS — F419 Anxiety disorder, unspecified: Secondary | ICD-10-CM

## 2021-11-02 DIAGNOSIS — E871 Hypo-osmolality and hyponatremia: Secondary | ICD-10-CM | POA: Diagnosis not present

## 2021-11-02 LAB — COMPREHENSIVE METABOLIC PANEL
ALT: 8 U/L (ref 0–35)
AST: 13 U/L (ref 0–37)
Albumin: 4.4 g/dL (ref 3.5–5.2)
Alkaline Phosphatase: 103 U/L (ref 39–117)
BUN: 17 mg/dL (ref 6–23)
CO2: 29 mEq/L (ref 19–32)
Calcium: 9.7 mg/dL (ref 8.4–10.5)
Chloride: 102 mEq/L (ref 96–112)
Creatinine, Ser: 0.95 mg/dL (ref 0.40–1.20)
GFR: 53.59 mL/min — ABNORMAL LOW (ref >60.00)
Glucose, Bld: 88 mg/dL (ref 70–99)
Potassium: 4.8 mEq/L (ref 3.5–5.1)
Sodium: 139 mEq/L (ref 135–145)
Total Bilirubin: 0.4 mg/dL (ref 0.2–1.2)
Total Protein: 7.4 g/dL (ref 6.0–8.3)

## 2021-11-02 LAB — CBC
HCT: 39.5 % (ref 36.0–46.0)
Hemoglobin: 13.1 g/dL (ref 12.0–15.0)
MCHC: 33.1 g/dL (ref 30.0–36.0)
MCV: 106.5 fl — ABNORMAL HIGH (ref 78.0–100.0)
Platelets: 277 10*3/uL (ref 150.0–400.0)
RBC: 3.71 Mil/uL — ABNORMAL LOW (ref 3.87–5.11)
RDW: 14 % (ref 11.5–15.5)
WBC: 7.6 10*3/uL (ref 4.0–10.5)

## 2021-11-02 LAB — MAGNESIUM: Magnesium: 2.2 mg/dL (ref 1.5–2.5)

## 2021-11-02 LAB — TSH: TSH: 3 u[IU]/mL (ref 0.35–5.50)

## 2021-11-02 NOTE — Patient Instructions (Signed)
We are checking the labs to look for a cause of the legs. ?

## 2021-11-02 NOTE — Progress Notes (Signed)
? ?  Subjective:  ? ?Patient ID: Tina Riley, female    DOB: 01-19-1932, 88 y.o.   MRN: [id number] ? ?HPI ?The patient is an 88 YO female coming in for follow up several concerns. ? ?Review of Systems  ?Constitutional:  Positive for activity change, appetite change and fatigue.  ?HENT: Negative.    ?Eyes: Negative.   ?Respiratory:  Negative for cough, chest tightness and shortness of breath.   ?Cardiovascular:  Positive for leg swelling. Negative for chest pain and palpitations.  ?Gastrointestinal:  Positive for diarrhea. Negative for abdominal distention, abdominal pain, constipation, nausea and vomiting.  ?Musculoskeletal: Negative.   ?Skin:  Positive for color change.  ?Neurological: Negative.   ?Psychiatric/Behavioral:  Positive for dysphoric mood. The patient is nervous/anxious.   ? ?Objective:  ?Physical Exam ?Constitutional:   ?   Appearance: She is well-developed.  ?   Comments: Thin and chronically ill appearing  ?HENT:  ?   Head: Normocephalic and atraumatic.  ?Cardiovascular:  ?   Rate and Rhythm: Normal rate and regular rhythm.  ?Pulmonary:  ?   Effort: Pulmonary effort is normal. No respiratory distress.  ?   Breath sounds: Normal breath sounds. No wheezing or rales.  ?Abdominal:  ?   General: Bowel sounds are normal. There is no distension.  ?   Palpations: Abdomen is soft.  ?   Tenderness: There is no abdominal tenderness. There is no rebound.  ?Musculoskeletal:  ?   Cervical back: Normal range of motion.  ?   Right lower leg: Edema present.  ?   Left lower leg: Edema present.  ?   Comments: 1+ edema bilaterally with redness to the skin stable from prior  ?Skin: ?   General: Skin is warm and dry.  ?Neurological:  ?   Mental Status: She is alert and oriented to person, place, and time.  ?   Coordination: Coordination abnormal.  ? ? ?Vitals:  ? 11/02/21 1501  ?BP: 122/80  ?Pulse: 67  ?Resp: 18  ?SpO2: 96%  ?Weight: 97 lb 3.2 oz (44.1 kg)  ?Height: 4\' 11"  (1.499 m)  ? ? ?This visit occurred during  the SARS-CoV-2 public health emergency.  Safety protocols were in place, including screening questions prior to the visit, additional usage of staff PPE, and extensive cleaning of exam room while observing appropriate contact time as indicated for disinfecting solutions.  ? ?Assessment & Plan:  ? ?

## 2021-11-03 ENCOUNTER — Encounter: Payer: Self-pay | Admitting: Internal Medicine

## 2021-11-03 ENCOUNTER — Other Ambulatory Visit: Payer: Self-pay

## 2021-11-03 ENCOUNTER — Inpatient Hospital Stay (HOSPITAL_COMMUNITY)
Admission: EM | Admit: 2021-11-03 | Discharge: 2021-11-10 | DRG: 871 | Disposition: A | Discharge status: Skilled Nursing Facility | Payer: Medicare Other | Attending: Internal Medicine | Admitting: Internal Medicine

## 2021-11-03 ENCOUNTER — Emergency Department (HOSPITAL_COMMUNITY): Payer: Medicare Other

## 2021-11-03 ENCOUNTER — Encounter (HOSPITAL_COMMUNITY): Payer: Self-pay | Admitting: *Deleted

## 2021-11-03 DIAGNOSIS — Z9049 Acquired absence of other specified parts of digestive tract: Secondary | ICD-10-CM | POA: Diagnosis not present

## 2021-11-03 DIAGNOSIS — N289 Disorder of kidney and ureter, unspecified: Secondary | ICD-10-CM

## 2021-11-03 DIAGNOSIS — E785 Hyperlipidemia, unspecified: Secondary | ICD-10-CM | POA: Diagnosis not present

## 2021-11-03 DIAGNOSIS — M542 Cervicalgia: Secondary | ICD-10-CM | POA: Diagnosis not present

## 2021-11-03 DIAGNOSIS — Z8261 Family history of arthritis: Secondary | ICD-10-CM | POA: Diagnosis not present

## 2021-11-03 DIAGNOSIS — A419 Sepsis, unspecified organism: Secondary | ICD-10-CM | POA: Diagnosis present

## 2021-11-03 DIAGNOSIS — S0083XA Contusion of other part of head, initial encounter: Secondary | ICD-10-CM | POA: Diagnosis not present

## 2021-11-03 DIAGNOSIS — Z66 Do not resuscitate: Secondary | ICD-10-CM | POA: Diagnosis not present

## 2021-11-03 DIAGNOSIS — K219 Gastro-esophageal reflux disease without esophagitis: Secondary | ICD-10-CM | POA: Diagnosis present

## 2021-11-03 DIAGNOSIS — W19XXXA Unspecified fall, initial encounter: Secondary | ICD-10-CM | POA: Diagnosis not present

## 2021-11-03 DIAGNOSIS — K7689 Other specified diseases of liver: Secondary | ICD-10-CM | POA: Diagnosis not present

## 2021-11-03 DIAGNOSIS — J4540 Moderate persistent asthma, uncomplicated: Secondary | ICD-10-CM | POA: Diagnosis not present

## 2021-11-03 DIAGNOSIS — Z823 Family history of stroke: Secondary | ICD-10-CM

## 2021-11-03 DIAGNOSIS — K581 Irritable bowel syndrome with constipation: Secondary | ICD-10-CM | POA: Diagnosis present

## 2021-11-03 DIAGNOSIS — K5900 Constipation, unspecified: Secondary | ICD-10-CM | POA: Diagnosis present

## 2021-11-03 DIAGNOSIS — Z743 Need for continuous supervision: Secondary | ICD-10-CM | POA: Diagnosis not present

## 2021-11-03 DIAGNOSIS — R1314 Dysphagia, pharyngoesophageal phase: Secondary | ICD-10-CM | POA: Diagnosis present

## 2021-11-03 DIAGNOSIS — I700 Atherosclerosis of aorta: Secondary | ICD-10-CM | POA: Diagnosis not present

## 2021-11-03 DIAGNOSIS — S40021A Contusion of right upper arm, initial encounter: Secondary | ICD-10-CM | POA: Diagnosis not present

## 2021-11-03 DIAGNOSIS — Z881 Allergy status to other antibiotic agents status: Secondary | ICD-10-CM

## 2021-11-03 DIAGNOSIS — J45909 Unspecified asthma, uncomplicated: Secondary | ICD-10-CM | POA: Diagnosis present

## 2021-11-03 DIAGNOSIS — K2289 Other specified disease of esophagus: Secondary | ICD-10-CM | POA: Diagnosis present

## 2021-11-03 DIAGNOSIS — I10 Essential (primary) hypertension: Secondary | ICD-10-CM | POA: Diagnosis not present

## 2021-11-03 DIAGNOSIS — J9811 Atelectasis: Secondary | ICD-10-CM | POA: Diagnosis not present

## 2021-11-03 DIAGNOSIS — A4159 Other Gram-negative sepsis: Secondary | ICD-10-CM | POA: Diagnosis not present

## 2021-11-03 DIAGNOSIS — W1830XA Fall on same level, unspecified, initial encounter: Secondary | ICD-10-CM | POA: Diagnosis present

## 2021-11-03 DIAGNOSIS — R54 Age-related physical debility: Secondary | ICD-10-CM | POA: Diagnosis not present

## 2021-11-03 DIAGNOSIS — Z7951 Long term (current) use of inhaled steroids: Secondary | ICD-10-CM

## 2021-11-03 DIAGNOSIS — Y92009 Unspecified place in unspecified non-institutional (private) residence as the place of occurrence of the external cause: Secondary | ICD-10-CM | POA: Diagnosis not present

## 2021-11-03 DIAGNOSIS — J690 Pneumonitis due to inhalation of food and vomit: Secondary | ICD-10-CM | POA: Diagnosis present

## 2021-11-03 DIAGNOSIS — Z20822 Contact with and (suspected) exposure to covid-19: Secondary | ICD-10-CM | POA: Diagnosis not present

## 2021-11-03 DIAGNOSIS — R109 Unspecified abdominal pain: Secondary | ICD-10-CM | POA: Diagnosis not present

## 2021-11-03 DIAGNOSIS — S40022A Contusion of left upper arm, initial encounter: Secondary | ICD-10-CM | POA: Diagnosis not present

## 2021-11-03 DIAGNOSIS — Z8249 Family history of ischemic heart disease and other diseases of the circulatory system: Secondary | ICD-10-CM

## 2021-11-03 DIAGNOSIS — Z83438 Family history of other disorder of lipoprotein metabolism and other lipidemia: Secondary | ICD-10-CM

## 2021-11-03 DIAGNOSIS — J189 Pneumonia, unspecified organism: Secondary | ICD-10-CM | POA: Diagnosis present

## 2021-11-03 DIAGNOSIS — R42 Dizziness and giddiness: Secondary | ICD-10-CM | POA: Diagnosis not present

## 2021-11-03 DIAGNOSIS — I499 Cardiac arrhythmia, unspecified: Secondary | ICD-10-CM | POA: Diagnosis not present

## 2021-11-03 DIAGNOSIS — N281 Cyst of kidney, acquired: Secondary | ICD-10-CM | POA: Diagnosis not present

## 2021-11-03 DIAGNOSIS — E872 Acidosis, unspecified: Secondary | ICD-10-CM | POA: Diagnosis present

## 2021-11-03 DIAGNOSIS — Z882 Allergy status to sulfonamides status: Secondary | ICD-10-CM

## 2021-11-03 DIAGNOSIS — R519 Headache, unspecified: Secondary | ICD-10-CM | POA: Diagnosis not present

## 2021-11-03 DIAGNOSIS — R911 Solitary pulmonary nodule: Secondary | ICD-10-CM | POA: Diagnosis present

## 2021-11-03 DIAGNOSIS — M7989 Other specified soft tissue disorders: Secondary | ICD-10-CM | POA: Diagnosis not present

## 2021-11-03 DIAGNOSIS — Z833 Family history of diabetes mellitus: Secondary | ICD-10-CM

## 2021-11-03 DIAGNOSIS — Z96642 Presence of left artificial hip joint: Secondary | ICD-10-CM | POA: Diagnosis not present

## 2021-11-03 DIAGNOSIS — N390 Urinary tract infection, site not specified: Secondary | ICD-10-CM | POA: Diagnosis present

## 2021-11-03 DIAGNOSIS — J984 Other disorders of lung: Secondary | ICD-10-CM | POA: Diagnosis not present

## 2021-11-03 DIAGNOSIS — R404 Transient alteration of awareness: Secondary | ICD-10-CM | POA: Diagnosis not present

## 2021-11-03 DIAGNOSIS — Z79899 Other long term (current) drug therapy: Secondary | ICD-10-CM | POA: Diagnosis not present

## 2021-11-03 DIAGNOSIS — Z825 Family history of asthma and other chronic lower respiratory diseases: Secondary | ICD-10-CM

## 2021-11-03 DIAGNOSIS — Z888 Allergy status to other drugs, medicaments and biological substances status: Secondary | ICD-10-CM

## 2021-11-03 DIAGNOSIS — R6520 Severe sepsis without septic shock: Secondary | ICD-10-CM | POA: Diagnosis present

## 2021-11-03 DIAGNOSIS — S0993XA Unspecified injury of face, initial encounter: Secondary | ICD-10-CM | POA: Diagnosis not present

## 2021-11-03 DIAGNOSIS — R6889 Other general symptoms and signs: Secondary | ICD-10-CM | POA: Diagnosis not present

## 2021-11-03 DIAGNOSIS — R0689 Other abnormalities of breathing: Secondary | ICD-10-CM | POA: Diagnosis not present

## 2021-11-03 DIAGNOSIS — K5730 Diverticulosis of large intestine without perforation or abscess without bleeding: Secondary | ICD-10-CM | POA: Diagnosis not present

## 2021-11-03 LAB — CBC WITH DIFFERENTIAL/PLATELET
Abs Immature Granulocytes: 0.09 10*3/uL — ABNORMAL HIGH (ref 0.00–0.07)
Basophils Absolute: 0.1 10*3/uL (ref 0.0–0.1)
Basophils Relative: 0 %
Eosinophils Absolute: 0 10*3/uL (ref 0.0–0.5)
Eosinophils Relative: 0 %
HCT: 47 % — ABNORMAL HIGH (ref 36.0–46.0)
Hemoglobin: 15.4 g/dL — ABNORMAL HIGH (ref 12.0–15.0)
Immature Granulocytes: 1 %
Lymphocytes Relative: 17 %
Lymphs Abs: 2.9 10*3/uL (ref 0.7–4.0)
MCH: 34.4 pg — ABNORMAL HIGH (ref 26.0–34.0)
MCHC: 32.8 g/dL (ref 30.0–36.0)
MCV: 104.9 fL — ABNORMAL HIGH (ref 80.0–100.0)
Monocytes Absolute: 0.9 10*3/uL (ref 0.1–1.0)
Monocytes Relative: 5 %
Neutro Abs: 13.5 10*3/uL — ABNORMAL HIGH (ref 1.7–7.7)
Neutrophils Relative %: 77 %
Platelets: 333 10*3/uL (ref 150–400)
RBC: 4.48 MIL/uL (ref 3.87–5.11)
RDW: 12.8 % (ref 11.5–15.5)
WBC: 17.4 10*3/uL — ABNORMAL HIGH (ref 4.0–10.5)
nRBC: 0 % (ref 0.0–0.2)

## 2021-11-03 LAB — COMPREHENSIVE METABOLIC PANEL
ALT: 16 U/L (ref 0–44)
AST: 26 U/L (ref 15–41)
Albumin: 3.7 g/dL (ref 3.5–5.0)
Alkaline Phosphatase: 96 U/L (ref 38–126)
Anion gap: 16 — ABNORMAL HIGH (ref 5–15)
BUN: 13 mg/dL (ref 8–23)
CO2: 19 mmol/L — ABNORMAL LOW (ref 22–32)
Calcium: 9.1 mg/dL (ref 8.9–10.3)
Chloride: 103 mmol/L (ref 98–111)
Creatinine, Ser: 1.04 mg/dL — ABNORMAL HIGH (ref 0.44–1.00)
GFR, Estimated: 52 mL/min — ABNORMAL LOW (ref >60)
Glucose, Bld: 187 mg/dL — ABNORMAL HIGH (ref 70–99)
Potassium: 4.1 mmol/L (ref 3.5–5.1)
Sodium: 138 mmol/L (ref 135–145)
Total Bilirubin: 1.1 mg/dL (ref 0.3–1.2)
Total Protein: 7 g/dL (ref 6.5–8.1)

## 2021-11-03 LAB — URINALYSIS, ROUTINE W REFLEX MICROSCOPIC
Bilirubin Urine: NEGATIVE
Glucose, UA: 100 mg/dL — AB
Ketones, ur: 15 mg/dL — AB
Nitrite: POSITIVE — AB
Protein, ur: 100 mg/dL — AB
Specific Gravity, Urine: 1.025 (ref 1.005–1.030)
pH: 6 (ref 5.0–8.0)

## 2021-11-03 LAB — URINALYSIS, MICROSCOPIC (REFLEX): WBC, UA: >50 WBC/hpf (ref 0–5)

## 2021-11-03 LAB — RAPID URINE DRUG SCREEN, HOSP PERFORMED
Amphetamines: NOT DETECTED
Barbiturates: NOT DETECTED
Benzodiazepines: NOT DETECTED
Cocaine: NOT DETECTED
Opiates: NOT DETECTED
Tetrahydrocannabinol: NOT DETECTED

## 2021-11-03 LAB — TROPONIN I (HIGH SENSITIVITY): Troponin I (High Sensitivity): 24 ng/L — ABNORMAL HIGH (ref <18)

## 2021-11-03 LAB — LACTIC ACID, PLASMA: Lactic Acid, Venous: 2 mmol/L (ref 0.5–1.9)

## 2021-11-03 LAB — APTT: aPTT: 26 seconds (ref 24–36)

## 2021-11-03 LAB — PROTIME-INR
INR: 1.1 (ref 0.8–1.2)
Prothrombin Time: 13.6 seconds (ref 11.4–15.2)

## 2021-11-03 LAB — ETHANOL: Alcohol, Ethyl (B): <10 mg/dL (ref <10)

## 2021-11-03 LAB — LIPASE, BLOOD: Lipase: 28 U/L (ref 11–51)

## 2021-11-03 LAB — CK: Total CK: 996 U/L — ABNORMAL HIGH (ref 38–234)

## 2021-11-03 MED ORDER — ONDANSETRON HCL 4 MG/2ML IJ SOLN
4.0000 mg | Freq: Once | INTRAMUSCULAR | Status: AC
  Administered 2021-11-03: 4 mg via INTRAVENOUS
  Filled 2021-11-03: qty 2

## 2021-11-03 MED ORDER — LACTATED RINGERS IV BOLUS
1000.0000 mL | Freq: Once | INTRAVENOUS | Status: AC
  Administered 2021-11-04: 1000 mL via INTRAVENOUS

## 2021-11-03 MED ORDER — LACTATED RINGERS IV BOLUS
1000.0000 mL | Freq: Once | INTRAVENOUS | Status: AC
  Administered 2021-11-03: 1000 mL via INTRAVENOUS

## 2021-11-03 NOTE — ED Triage Notes (Signed)
Pt arrives from home via [facility], earlier this morning she felt dizzy. Denies falling. Bruising on the top of her head. Prone in the bathroom, laying in urine. Says she has been there since this morning. Right eye is swollen shut. She has a c collar on for transport. 170/101, hr 90, 30 rr, 98% ra. Pt lives by herself. Recent episodes of diarrhea. "Not acting herself per family." CBG 200. EKG wnl ?

## 2021-11-03 NOTE — ED Provider Notes (Signed)
?[facility] EMERGENCY DEPARTMENT ?Provider Note ? ? ?CSN: [id number] ?Arrival date & time: 11/03/21  2211 ? ?  ? ?History ? ?Chief Complaint  ?Patient presents with  ? Dizziness  ? ? ?Tina Riley is a 88 y.o. female who presents via EMS after having been found down in her home by her niece and nephew.  Patient states she has been down since she first woke up this morning.  States she got up out of bed to go to the bathroom, felt very lightheaded, and lay on the floor.  She was unaware that it was evening time and states she thinks she has been there all day.  According to family she is confused.  Presents to the ED covered in her own urine and feces having been incontinent while on the floor.  Endorses a few days of nausea, abdominal pain, headaches, and lightheadedness without blurry or double vision. ? ?Level 5 caveat due to acuity of presentation upon arrival.  I have personally reviewed this patient's medical records.  She is history of GERD, small bowel obstruction, hypertension, urinary tract infections.  She is not anticoagulated. ? ?HPI ? ?  ? ?Home Medications ?Prior to Admission medications   ?Medication Sig Start Date End Date Taking? Authorizing Provider  ?acetaminophen (TYLENOL) 500 MG tablet Take 500 mg by mouth daily as needed for headache or mild pain.    [provider]  ?albuterol (VENTOLIN HFA) 108 (90 Base) MCG/ACT inhaler Inhale 2 puffs into the lungs every 6 (six) hours as needed for wheezing or shortness of breath. 06/02/21   Myrlene Broker, MD  ?clonazePAM Scarlette Calico) 0.5 MG tablet Take 1 tablet (0.5 mg total) by mouth 2 (two) times daily as needed for anxiety 08/21/21 02/17/22  Myrlene Broker, MD  ?gabapentin (NEURONTIN) 100 MG capsule Take 3 capsules (300 mg total) by mouth at bedtime. 09/09/21   Myrlene Broker, MD  ?levocetirizine (XYZAL) 5 MG tablet Take 1 tablet (5 mg total) by mouth daily. 08/21/21   Myrlene Broker, MD  ?Nutritional  Supplements (FEEDING SUPPLEMENT, BOOST BREEZE,) LIQD Take 237 mLs by mouth daily. 08/21/21   Myrlene Broker, MD  ?pantoprazole (PROTONIX) 40 MG tablet Take 1 tablet (40 mg total) by mouth 2 (two) times daily. ?Patient taking differently: Take 40 mg by mouth daily. 02/23/21   Myrlene Broker, MD  ?propranolol ER (INDERAL LA) 60 MG 24 hr capsule Take 1 capsule (60 mg total) by mouth daily. 05/26/21   Myrlene Broker, MD  ?rifaximin (XIFAXAN) 550 MG TABS tablet Take 1 tablet (550 mg total) by mouth 3 (three) times daily for 14 days. 10/27/21 11/10/21  Imogene Burn, MD  ?rosuvastatin (CRESTOR) 5 MG tablet Take 1 tablet (5 mg total) by mouth at bedtime. 08/21/21   Myrlene Broker, MD  ?[place] 160-4.5 MCG/ACT inhaler Inhale 2 puffs into the lungs 2 (two) times daily. 08/21/21   Myrlene Broker, MD  ?Benay Spice 5 % SOLN Place 1 drop into both eyes 2 (two) times daily. 09/15/21     ?   ? ?Allergies    ?Cephalexin, Ciprofloxacin, Other, Meperidine hcl, Sulfa antibiotics, Cefdinir, and Doxycycline   ? ?Review of Systems   ?Review of Systems  ?Constitutional:  Positive for fatigue.  ?Gastrointestinal:  Positive for abdominal pain and nausea.  ?Neurological:  Positive for light-headedness and headaches.  ? ?Physical Exam ?Updated Vital Signs ?There were no vitals taken for this visit. ?Physical Exam ?Vitals and  nursing note reviewed.  ?Constitutional:   ?   Appearance: She is toxic-appearing.  ?HENT:  ?   Head: Normocephalic and atraumatic. No raccoon eyes or Battle's sign.  ? ?   Right Ear: No hemotympanum.  ?   Left Ear: No hemotympanum.  ?   Ears:  ? ?   Nose: Nose normal.  ?   Mouth/Throat:  ?   Mouth: Mucous membranes are dry.  ?   Pharynx: Oropharynx is clear. Uvula midline. No oropharyngeal exudate or posterior oropharyngeal erythema.  ?   Tonsils: No tonsillar exudate.  ?Eyes:  ?   General: Lids are normal. Vision grossly intact.     ?   Right eye: No discharge.     ?   Left eye: No discharge.  ?    Extraocular Movements: Extraocular movements intact.  ?   Conjunctiva/sclera: Conjunctivae normal.  ?   Pupils: Pupils are equal, round, and reactive to light.  ?Neck:  ?   Trachea: Trachea and phonation normal.  ?   Comments: C-collar in place, no midline tenderness palpation. ?Cardiovascular:  ?   Rate and Rhythm: Normal rate and regular rhythm.  ?   Pulses: Normal pulses.  ?   Heart sounds: Murmur heard.  ?Systolic murmur is present.  ?Pulmonary:  ?   Effort: Pulmonary effort is normal. No tachypnea, bradypnea, accessory muscle usage, prolonged expiration or respiratory distress.  ?   Breath sounds: Normal breath sounds. No wheezing or rales.  ?Chest:  ?   Chest wall: No mass, lacerations, deformity, swelling, tenderness, crepitus or edema.  ?Abdominal:  ?   General: Bowel sounds are normal. There is no distension.  ?   Palpations: Abdomen is soft.  ?   Tenderness: There is abdominal tenderness in the right lower quadrant and left lower quadrant. There is no right CVA tenderness, left CVA tenderness, guarding or rebound.  ?Musculoskeletal:     ?   General: No deformity.  ?   Cervical back: Neck supple.  ?   Right lower leg: No edema.  ?   Left lower leg: No edema.  ?   Comments: Moving all extremities spontaneously and without difficulty.  Severely kyphotic.  ?Skin: ?   General: Skin is warm and dry.  ?   Capillary Refill: Capillary refill takes 2 to 3 seconds.  ?   Comments: Bruising over the bilateral upper extremities.  ?Neurological:  ?   Mental Status: She is alert and oriented to person, place, and time. Mental status is at baseline.  ?   GCS: GCS eye subscore is 4. GCS verbal subscore is 5. GCS motor subscore is 6.  ?   Cranial Nerves: Cranial nerves 2-12 are intact.  ?   Sensory: Sensation is intact.  ?   Motor: Motor function is intact.  ?   Coordination: Coordination normal.  ?   Comments: Symmetric strength and sensation in upper and lower extremities bilaterally.   ?Psychiatric:     ?   Mood and  Affect: Mood normal.  ? ? ?ED Results / Procedures / Treatments   ?Labs ?(all labs ordered are listed, but only abnormal results are displayed) ?Labs Reviewed  ?CULTURE, BLOOD (ROUTINE X 2)  ?CULTURE, BLOOD (ROUTINE X 2)  ?URINE CULTURE  ?RESP PANEL BY RT-PCR (FLU A&B, COVID) [id number]  ?LACTIC ACID, PLASMA  ?LACTIC ACID, PLASMA  ?COMPREHENSIVE METABOLIC PANEL  ?CBC WITH DIFFERENTIAL/PLATELET  ?PROTIME-INR  ?APTT  ?URINALYSIS, ROUTINE W REFLEX MICROSCOPIC  ?BRAIN NATRIURETIC  PEPTIDE  ?CK  ?LIPASE, BLOOD  ?ETHANOL  ?RAPID URINE DRUG SCREEN, HOSP PERFORMED  ?TROPONIN I (HIGH SENSITIVITY)  ? ? ?EKG ?None ? ?Radiology ?No results found. ? ?Procedures ?Marland KitchenCritical Care ?Performed by: Paris Lore, PA-C ?Authorized by: Paris Lore, PA-C  ? ?Critical care provider statement:  ?  Critical care time (minutes):  45 ?  Critical care was necessary to treat or prevent imminent or life-threatening deterioration of the following conditions:  Sepsis ?  Critical care was time spent personally by me on the following activities:  Development of treatment plan with patient or surrogate, discussions with consultants, evaluation of patient's response to treatment, examination of patient, obtaining history from patient or surrogate, ordering and performing treatments and interventions, ordering and review of laboratory studies, ordering and review of radiographic studies, pulse oximetry and re-evaluation of patient's condition  ? ? ?Medications Ordered in ED ?Medications - No data to display ? ?ED Course/ Medical Decision Making/ A&P ?Clinical Course as of 11/04/21 0648  ?Tue Nov 03, 2021  ?2319 ED pharmacist to consult for antibiotic choice for UTI.  [RS]  ?Wed Nov 04, 2021  ?0212 Consult to cardiology, Dr. Julianne Handler, who states that this change in troponin is not concerning for ACS.  He remains available for formal consultation should admitting team desire it.  I appreciate his collaboration of care with patient. [RS]  ?[id number]  Consult to hospitalist Dr. Loney Loh, who is agreeable to admitting this patient to her service. I appreciate her collaboration in the care of this patient.  [RS]  ?8938 CT CHEST ABDOMEN PELVIS W CONTRAST [RS]

## 2021-11-03 NOTE — ED Notes (Signed)
Pt says that she felt dizzy this morning so she just laid on the floor in the bathroom, denies that she fell. She is incontinent of odorous urine and stool on arrival. She reports she has had a headache recently, feels a little nauseated. Multiple areas of bruising, pt says she does not remember falling.  ?

## 2021-11-04 ENCOUNTER — Emergency Department (HOSPITAL_COMMUNITY): Payer: Medicare Other

## 2021-11-04 DIAGNOSIS — Z9049 Acquired absence of other specified parts of digestive tract: Secondary | ICD-10-CM | POA: Diagnosis not present

## 2021-11-04 DIAGNOSIS — K5730 Diverticulosis of large intestine without perforation or abscess without bleeding: Secondary | ICD-10-CM | POA: Diagnosis not present

## 2021-11-04 DIAGNOSIS — J690 Pneumonitis due to inhalation of food and vomit: Secondary | ICD-10-CM | POA: Diagnosis present

## 2021-11-04 DIAGNOSIS — E872 Acidosis, unspecified: Secondary | ICD-10-CM | POA: Diagnosis present

## 2021-11-04 DIAGNOSIS — K2289 Other specified disease of esophagus: Secondary | ICD-10-CM | POA: Diagnosis not present

## 2021-11-04 DIAGNOSIS — R1310 Dysphagia, unspecified: Secondary | ICD-10-CM | POA: Diagnosis not present

## 2021-11-04 DIAGNOSIS — R54 Age-related physical debility: Secondary | ICD-10-CM | POA: Diagnosis present

## 2021-11-04 DIAGNOSIS — Z743 Need for continuous supervision: Secondary | ICD-10-CM | POA: Diagnosis not present

## 2021-11-04 DIAGNOSIS — N390 Urinary tract infection, site not specified: Secondary | ICD-10-CM

## 2021-11-04 DIAGNOSIS — A419 Sepsis, unspecified organism: Secondary | ICD-10-CM | POA: Diagnosis not present

## 2021-11-04 DIAGNOSIS — M7989 Other specified soft tissue disorders: Secondary | ICD-10-CM | POA: Diagnosis not present

## 2021-11-04 DIAGNOSIS — J45909 Unspecified asthma, uncomplicated: Secondary | ICD-10-CM | POA: Diagnosis not present

## 2021-11-04 DIAGNOSIS — R1314 Dysphagia, pharyngoesophageal phase: Secondary | ICD-10-CM | POA: Diagnosis present

## 2021-11-04 DIAGNOSIS — R519 Headache, unspecified: Secondary | ICD-10-CM | POA: Diagnosis not present

## 2021-11-04 DIAGNOSIS — J4540 Moderate persistent asthma, uncomplicated: Secondary | ICD-10-CM | POA: Diagnosis not present

## 2021-11-04 DIAGNOSIS — N281 Cyst of kidney, acquired: Secondary | ICD-10-CM | POA: Diagnosis not present

## 2021-11-04 DIAGNOSIS — Z823 Family history of stroke: Secondary | ICD-10-CM | POA: Diagnosis not present

## 2021-11-04 DIAGNOSIS — K5900 Constipation, unspecified: Secondary | ICD-10-CM | POA: Diagnosis not present

## 2021-11-04 DIAGNOSIS — E876 Hypokalemia: Secondary | ICD-10-CM | POA: Diagnosis not present

## 2021-11-04 DIAGNOSIS — K7689 Other specified diseases of liver: Secondary | ICD-10-CM | POA: Diagnosis not present

## 2021-11-04 DIAGNOSIS — A4159 Other Gram-negative sepsis: Secondary | ICD-10-CM | POA: Diagnosis present

## 2021-11-04 DIAGNOSIS — R911 Solitary pulmonary nodule: Secondary | ICD-10-CM

## 2021-11-04 DIAGNOSIS — R109 Unspecified abdominal pain: Secondary | ICD-10-CM

## 2021-11-04 DIAGNOSIS — N289 Disorder of kidney and ureter, unspecified: Secondary | ICD-10-CM | POA: Diagnosis not present

## 2021-11-04 DIAGNOSIS — J984 Other disorders of lung: Secondary | ICD-10-CM | POA: Diagnosis not present

## 2021-11-04 DIAGNOSIS — Y92009 Unspecified place in unspecified non-institutional (private) residence as the place of occurrence of the external cause: Secondary | ICD-10-CM

## 2021-11-04 DIAGNOSIS — Z8261 Family history of arthritis: Secondary | ICD-10-CM | POA: Diagnosis not present

## 2021-11-04 DIAGNOSIS — K219 Gastro-esophageal reflux disease without esophagitis: Secondary | ICD-10-CM

## 2021-11-04 DIAGNOSIS — S0993XA Unspecified injury of face, initial encounter: Secondary | ICD-10-CM | POA: Diagnosis not present

## 2021-11-04 DIAGNOSIS — W19XXXA Unspecified fall, initial encounter: Secondary | ICD-10-CM | POA: Diagnosis not present

## 2021-11-04 DIAGNOSIS — J309 Allergic rhinitis, unspecified: Secondary | ICD-10-CM | POA: Diagnosis not present

## 2021-11-04 DIAGNOSIS — J189 Pneumonia, unspecified organism: Secondary | ICD-10-CM | POA: Diagnosis present

## 2021-11-04 DIAGNOSIS — I700 Atherosclerosis of aorta: Secondary | ICD-10-CM | POA: Diagnosis not present

## 2021-11-04 DIAGNOSIS — Z66 Do not resuscitate: Secondary | ICD-10-CM | POA: Diagnosis present

## 2021-11-04 DIAGNOSIS — E785 Hyperlipidemia, unspecified: Secondary | ICD-10-CM | POA: Diagnosis not present

## 2021-11-04 DIAGNOSIS — S0083XA Contusion of other part of head, initial encounter: Secondary | ICD-10-CM | POA: Diagnosis present

## 2021-11-04 DIAGNOSIS — Z20822 Contact with and (suspected) exposure to covid-19: Secondary | ICD-10-CM | POA: Diagnosis present

## 2021-11-04 DIAGNOSIS — J9811 Atelectasis: Secondary | ICD-10-CM | POA: Diagnosis not present

## 2021-11-04 DIAGNOSIS — G4700 Insomnia, unspecified: Secondary | ICD-10-CM | POA: Diagnosis not present

## 2021-11-04 DIAGNOSIS — Z79899 Other long term (current) drug therapy: Secondary | ICD-10-CM | POA: Diagnosis not present

## 2021-11-04 DIAGNOSIS — K581 Irritable bowel syndrome with constipation: Secondary | ICD-10-CM | POA: Diagnosis present

## 2021-11-04 DIAGNOSIS — Z96642 Presence of left artificial hip joint: Secondary | ICD-10-CM | POA: Diagnosis present

## 2021-11-04 DIAGNOSIS — I10 Essential (primary) hypertension: Secondary | ICD-10-CM | POA: Diagnosis not present

## 2021-11-04 DIAGNOSIS — W1830XA Fall on same level, unspecified, initial encounter: Secondary | ICD-10-CM | POA: Diagnosis present

## 2021-11-04 DIAGNOSIS — R531 Weakness: Secondary | ICD-10-CM | POA: Diagnosis not present

## 2021-11-04 DIAGNOSIS — S40022A Contusion of left upper arm, initial encounter: Secondary | ICD-10-CM | POA: Diagnosis present

## 2021-11-04 DIAGNOSIS — M542 Cervicalgia: Secondary | ICD-10-CM | POA: Diagnosis not present

## 2021-11-04 DIAGNOSIS — S40021A Contusion of right upper arm, initial encounter: Secondary | ICD-10-CM | POA: Diagnosis present

## 2021-11-04 DIAGNOSIS — R404 Transient alteration of awareness: Secondary | ICD-10-CM | POA: Diagnosis not present

## 2021-11-04 LAB — TSH: TSH: 1.338 u[IU]/mL (ref 0.350–4.500)

## 2021-11-04 LAB — BASIC METABOLIC PANEL
Anion gap: 12 (ref 5–15)
BUN: 13 mg/dL (ref 8–23)
CO2: 21 mmol/L — ABNORMAL LOW (ref 22–32)
Calcium: 8.4 mg/dL — ABNORMAL LOW (ref 8.9–10.3)
Chloride: 106 mmol/L (ref 98–111)
Creatinine, Ser: 0.98 mg/dL (ref 0.44–1.00)
GFR, Estimated: 56 mL/min — ABNORMAL LOW (ref >60)
Glucose, Bld: 128 mg/dL — ABNORMAL HIGH (ref 70–99)
Potassium: 3.8 mmol/L (ref 3.5–5.1)
Sodium: 139 mmol/L (ref 135–145)

## 2021-11-04 LAB — TROPONIN I (HIGH SENSITIVITY): Troponin I (High Sensitivity): 31 ng/L — ABNORMAL HIGH (ref <18)

## 2021-11-04 LAB — LACTIC ACID, PLASMA
Lactic Acid, Venous: 2.5 mmol/L (ref 0.5–1.9)
Lactic Acid, Venous: 4.5 mmol/L (ref 0.5–1.9)
Lactic Acid, Venous: 2.9 mmol/L (ref 0.5–1.9)
Lactic Acid, Venous: 1.3 mmol/L (ref 0.5–1.9)
Lactic Acid, Venous: 1.8 mmol/L (ref 0.5–1.9)

## 2021-11-04 LAB — CBC
HCT: 41 % (ref 36.0–46.0)
Hemoglobin: 13.5 g/dL (ref 12.0–15.0)
MCH: 35.8 pg — ABNORMAL HIGH (ref 26.0–34.0)
MCHC: 32.9 g/dL (ref 30.0–36.0)
MCV: 108.8 fL — ABNORMAL HIGH (ref 80.0–100.0)
Platelets: 222 10*3/uL (ref 150–400)
RBC: 3.77 MIL/uL — ABNORMAL LOW (ref 3.87–5.11)
RDW: 13.2 % (ref 11.5–15.5)
WBC: 16 10*3/uL — ABNORMAL HIGH (ref 4.0–10.5)
nRBC: 0 % (ref 0.0–0.2)

## 2021-11-04 LAB — RESP PANEL BY RT-PCR (FLU A&B, COVID) ARPGX2
Influenza A by PCR: NEGATIVE
Influenza B by PCR: NEGATIVE
SARS Coronavirus 2 by RT PCR: NEGATIVE

## 2021-11-04 LAB — CK: Total CK: 780 U/L — ABNORMAL HIGH (ref 38–234)

## 2021-11-04 LAB — BRAIN NATRIURETIC PEPTIDE: B Natriuretic Peptide: 386.4 pg/mL — ABNORMAL HIGH (ref 0.0–100.0)

## 2021-11-04 LAB — PROCALCITONIN: Procalcitonin: 0.13 ng/mL

## 2021-11-04 MED ORDER — RIFAXIMIN 550 MG PO TABS
550.0000 mg | ORAL_TABLET | Freq: Three times a day (TID) | ORAL | Status: DC
  Filled 2021-11-04 (×2): qty 1

## 2021-11-04 MED ORDER — SODIUM CHLORIDE 0.9 % IV SOLN: 2.0000 g | Freq: Every day | INTRAVENOUS | Status: DC

## 2021-11-04 MED ORDER — SODIUM CHLORIDE 0.9 % IV SOLN
500.0000 mg | INTRAVENOUS | Status: DC
  Administered 2021-11-05: 500 mg via INTRAVENOUS
  Filled 2021-11-04: qty 5

## 2021-11-04 MED ORDER — LORATADINE 10 MG PO TABS
10.0000 mg | ORAL_TABLET | Freq: Every day | ORAL | Status: DC
  Administered 2021-11-04 – 2021-11-10 (×7): 10 mg via ORAL
  Filled 2021-11-04 (×7): qty 1

## 2021-11-04 MED ORDER — PANTOPRAZOLE SODIUM 40 MG PO TBEC
40.0000 mg | DELAYED_RELEASE_TABLET | Freq: Every day | ORAL | Status: DC
  Administered 2021-11-04 – 2021-11-10 (×7): 40 mg via ORAL
  Filled 2021-11-04 (×7): qty 1

## 2021-11-04 MED ORDER — GABAPENTIN 300 MG PO CAPS
300.0000 mg | ORAL_CAPSULE | Freq: Every day | ORAL | Status: DC
  Administered 2021-11-04 – 2021-11-06 (×3): 300 mg via ORAL
  Filled 2021-11-04 (×3): qty 1

## 2021-11-04 MED ORDER — SODIUM CHLORIDE 0.9 % IV SOLN
2.0000 g | Freq: Once | INTRAVENOUS | Status: AC
  Administered 2021-11-04: 2 g via INTRAVENOUS
  Filled 2021-11-04: qty 12.5

## 2021-11-04 MED ORDER — SENNOSIDES-DOCUSATE SODIUM 8.6-50 MG PO TABS
1.0000 | ORAL_TABLET | Freq: Two times a day (BID) | ORAL | Status: DC
  Administered 2021-11-04 – 2021-11-10 (×9): 1 via ORAL
  Filled 2021-11-04 (×13): qty 1

## 2021-11-04 MED ORDER — BOOST / RESOURCE BREEZE PO LIQD CUSTOM
237.0000 mL | Freq: Every day | ORAL | Status: DC
  Administered 2021-11-05 – 2021-11-09 (×5): 1 via ORAL

## 2021-11-04 MED ORDER — ACETAMINOPHEN 650 MG RE SUPP: 650.0000 mg | Freq: Four times a day (QID) | RECTAL | Status: DC | PRN

## 2021-11-04 MED ORDER — POLYETHYL GLYCOL-PROPYL GLYCOL 0.4-0.3 % OP GEL
Freq: Every day | OPHTHALMIC | Status: DC | PRN
Start: 2021-11-04 — End: 2021-11-04

## 2021-11-04 MED ORDER — CLONAZEPAM 0.5 MG PO TABS
0.5000 mg | ORAL_TABLET | Freq: Every day | ORAL | Status: DC
  Administered 2021-11-04 – 2021-11-05 (×2): 0.5 mg via ORAL
  Filled 2021-11-04 (×2): qty 1

## 2021-11-04 MED ORDER — SODIUM CHLORIDE 0.9 % IV SOLN
500.0000 mg | Freq: Once | INTRAVENOUS | Status: AC
  Administered 2021-11-04: 500 mg via INTRAVENOUS
  Filled 2021-11-04: qty 5

## 2021-11-04 MED ORDER — PROPRANOLOL HCL ER 60 MG PO CP24
60.0000 mg | ORAL_CAPSULE | Freq: Every day | ORAL | Status: DC
  Administered 2021-11-05 – 2021-11-10 (×6): 60 mg via ORAL
  Filled 2021-11-04 (×7): qty 1

## 2021-11-04 MED ORDER — SODIUM CHLORIDE 0.9% FLUSH
3.0000 mL | Freq: Two times a day (BID) | INTRAVENOUS | Status: DC
  Administered 2021-11-04 – 2021-11-09 (×11): 3 mL via INTRAVENOUS

## 2021-11-04 MED ORDER — SORBITOL 70 % SOLN
300.0000 mL | TOPICAL_OIL | Freq: Once | ORAL | Status: AC
  Administered 2021-11-04: 300 mL via RECTAL
  Filled 2021-11-04 (×2): qty 90

## 2021-11-04 MED ORDER — ONDANSETRON HCL 4 MG/2ML IJ SOLN: 4.0000 mg | Freq: Four times a day (QID) | INTRAMUSCULAR | Status: DC | PRN

## 2021-11-04 MED ORDER — ONDANSETRON HCL 4 MG PO TABS
4.0000 mg | ORAL_TABLET | Freq: Four times a day (QID) | ORAL | Status: DC | PRN
  Administered 2021-11-08: 4 mg via ORAL
  Filled 2021-11-04: qty 1

## 2021-11-04 MED ORDER — ROSUVASTATIN CALCIUM 5 MG PO TABS
5.0000 mg | ORAL_TABLET | Freq: Every day | ORAL | Status: DC
  Administered 2021-11-04 – 2021-11-09 (×6): 5 mg via ORAL
  Filled 2021-11-04 (×6): qty 1

## 2021-11-04 MED ORDER — LACTATED RINGERS IV SOLN: INTRAVENOUS | Status: AC

## 2021-11-04 MED ORDER — ENOXAPARIN SODIUM 30 MG/0.3ML IJ SOSY
30.0000 mg | PREFILLED_SYRINGE | INTRAMUSCULAR | Status: DC
  Administered 2021-11-05 – 2021-11-09 (×5): 30 mg via SUBCUTANEOUS
  Filled 2021-11-04 (×5): qty 0.3

## 2021-11-04 MED ORDER — VALACYCLOVIR HCL 500 MG PO TABS
1000.0000 mg | ORAL_TABLET | Freq: Every day | ORAL | Status: DC
  Administered 2021-11-05 – 2021-11-10 (×6): 1000 mg via ORAL
  Filled 2021-11-04 (×7): qty 2

## 2021-11-04 MED ORDER — ACETAMINOPHEN 325 MG PO TABS
650.0000 mg | ORAL_TABLET | Freq: Four times a day (QID) | ORAL | Status: DC | PRN
  Administered 2021-11-04 – 2021-11-08 (×6): 650 mg via ORAL
  Filled 2021-11-04 (×7): qty 2

## 2021-11-04 MED ORDER — BUDESONIDE 0.5 MG/2ML IN SUSP
0.5000 mg | Freq: Two times a day (BID) | RESPIRATORY_TRACT | Status: DC
  Administered 2021-11-04 – 2021-11-10 (×13): 0.5 mg via RESPIRATORY_TRACT
  Filled 2021-11-04 (×13): qty 2

## 2021-11-04 MED ORDER — RIFAXIMIN 550 MG PO TABS: 550.0000 mg | ORAL_TABLET | ORAL | Status: DC

## 2021-11-04 MED ORDER — SODIUM CHLORIDE 0.9 % IV SOLN
2.0000 g | INTRAVENOUS | Status: DC
  Administered 2021-11-05: 2 g via INTRAVENOUS
  Filled 2021-11-04: qty 12.5

## 2021-11-04 MED ORDER — IOHEXOL 300 MG/ML  SOLN
80.0000 mL | Freq: Once | INTRAMUSCULAR | Status: AC | PRN
  Administered 2021-11-04: 80 mL via INTRAVENOUS

## 2021-11-04 MED ORDER — POLYVINYL ALCOHOL 1.4 % OP SOLN
1.0000 [drp] | OPHTHALMIC | Status: DC | PRN
  Filled 2021-11-04: qty 15

## 2021-11-04 MED ORDER — ARFORMOTEROL TARTRATE 15 MCG/2ML IN NEBU
15.0000 ug | INHALATION_SOLUTION | Freq: Two times a day (BID) | RESPIRATORY_TRACT | Status: DC
  Administered 2021-11-04 – 2021-11-10 (×13): 15 ug via RESPIRATORY_TRACT
  Filled 2021-11-04 (×13): qty 2

## 2021-11-04 MED ORDER — ALBUTEROL SULFATE (2.5 MG/3ML) 0.083% IN NEBU: 2.5000 mg | INHALATION_SOLUTION | Freq: Four times a day (QID) | RESPIRATORY_TRACT | Status: DC | PRN

## 2021-11-04 MED ORDER — LEVOCETIRIZINE DIHYDROCHLORIDE 5 MG PO TABS
5.0000 mg | ORAL_TABLET | Freq: Every evening | ORAL | Status: DC
Start: 2021-11-04 — End: 2021-11-04

## 2021-11-04 NOTE — ED Notes (Signed)
Pt Healthcare power of attorney is [name], can be contacted in number listed in chart with questions for pt. Reports she has a MOST form and he will bring it in the morning when he comes.  ?

## 2021-11-04 NOTE — Progress Notes (Signed)
NEW ADMISSION NOTE ?New Admission Note:  ? ?Arrival Method: Patient arrived from ED ?Mental Orientation: Alert and oriented x 4. ?Telemetry:  25M-19, ST ?Assessment: Completed ?Skin: warm, dry, ecchymosis with fall. ?IV: Right FA LR @100  ml/hr ?Pain: denies any pain. ?Tubes: purewick. ?Safety Measures: Safety Fall Prevention Plan has been given, discussed and signed ?Admission: Completed ?[facility] Orientation: Patient has been orientated to the room, unit and staff.  ? ?Orders have been reviewed and implemented. Will continue to monitor the patient. Call light has been placed within reach and bed alarm has been activated.  ? ? , RN   ?

## 2021-11-04 NOTE — ED Notes (Signed)
Patient transported to CT 

## 2021-11-04 NOTE — H&P (Signed)
?History and Physical  ? ? ?Patient: Tina Riley WGY:[id number] DOB: 1932/06/03 ?DOA: 11/03/2021 ?DOS: the patient was seen and examined on 11/04/2021 ?PCP: Myrlene Broker, MD  ?Patient coming from: Home lives alone ? ?Chief Complaint:  ?Chief Complaint  ?Patient presents with  ? Dizziness  ? ?HPI: Imagine Nest is a 88 y.o. female with medical history significant of hypertension, asthma, IBS, depression, small bowel obstruction s/p recestion in 1980s, and GERD who presents after having a fall yesterday morning due to complaints of dizziness.  Patient reported intermittent complaints of dizziness over the last week.  She had followed up with a primary care provider 2 days ago and notified them of her issues.  At baseline patient lives alone at baseline.  She had gone up yesterday morning and fallen on her way to the bathroom due to lightheadedness.  Family found her covered in urine and feces as patient has been incontinent while on the floor.  At baseline patient has urinary continence.  Patient reported that she has had nausea, headache, productive cough, abdominal pain mostly on the left lower quadrant, and loose stools.  She is supposed to follow-up with Dr. Leonides Schanz of GI in coming weeks.  Patient admits that she has had poor appetite due to nausea and abdominal discomfort, but had also been feeling like food was getting stuck in the middle of her throat.  Notes prior history of having her esophagus stretched.  Review of records note patient had been seen in the emergency department on 1/9 and found to have concerns for UTI discharged home on Macrobid however patient never took this medicine for her nephew who is also POA and pharmacist due to possible side effects. ? ? ? ?Upon admission to the emergency department patient was noted to be afebrile with heart rate 70-104, respiration 14-23, and blood pressures elevated up to 175/79, and O2 saturations maintained on room air.  Initial x-rays of  the chest and the pelvis did not note any acute abnormalities.  Labs from 4/18 significant for WBC 17.4, hemoglobin 15.4, BUN 13, creatinine 1.04, BNP 386.4, CK 996, troponin 24->31, lactic acid 2-> 2.5-> 4.5-> 2.9.  CT scan of the head, cervical spine, and maxillofacial did not note any acute intracranial CT findings, numerous tiny lucencies of the cervical vertebral bodies possibly related to osteopenia/metastases/myelomatous vertebrae, and C2-C3 central disc protrusion partially effacing the ventral CSF with mild on stenosing posterior disc bulging at C3-4.CT scan of the chest abdomen and pelvis noted concern for ill-defined nodular and groundglass opacities of the right lower lobe and right upper lobe, 13 mm nodule density of the right lower lobe, and large amount of stool throughout the colon and rectum without bowel obstruction.  Urinalysis was positive for large hemoglobin small leukocytes, positive nitrites, many bacteria, and greater than 50 WBCs.  Patient had been given 2 L of lactated Ringer's, cefepime, azithromycin, and given Zofran in the ED.   ? ? ? ?Review of Systems: As mentioned in the history of present illness. All other systems reviewed and are negative. ?Past Medical History:  ?Diagnosis Date  ? Arthritis   ? Asthma   ? Chicken pox   ? Depression   ? Diverticulitis   ? Genital warts   ? GERD (gastroesophageal reflux disease)   ? Hay fever   ? History of frequent urinary tract infections   ? Hypertension   ? IBS (irritable bowel syndrome)   ? Lactose intolerance   ? Peripheral neuropathy   ?  Urinary incontinence   ? ?Past Surgical History:  ?Procedure Laterality Date  ? ABDOMINAL HYSTERECTOMY    ? Hip Replacement  2020  ? PARTIAL COLECTOMY  1980s  ? TONSILLECTOMY AND ADENOIDECTOMY  childhood  ? vocal cord tumor removal  2012  ? ?Social History:  reports that she has never smoked. She has never used smokeless tobacco. She reports that she does not drink alcohol and does not use  drugs. ? ?Allergies  ?Allergen Reactions  ? Cephalexin Palpitations  ? Ciprofloxacin Palpitations  ? Other Itching  ? Meperidine Hcl   ?  Other reaction(s): Other ?Doesn't work  ? Sulfa Antibiotics Nausea And Vomiting  ? Cefdinir Nausea Only  ? Doxycycline Nausea Only  ? ? ?Family History  ?Problem Relation Age of Onset  ? Arthritis Mother   ? COPD Mother   ? Heart attack Father   ? Heart disease Father   ? Hypertension Father   ? Hyperlipidemia Father   ? Stroke Father   ? Stroke Sister   ? Hypertension Sister   ? Hyperlipidemia Sister   ? Hearing loss Sister   ? Cancer Sister   ? Arthritis Sister   ? Alcohol abuse Son   ? Diabetes Son   ? Drug abuse Son   ? Heart attack Son   ? Hypertension Son   ? ? ?Prior to Admission medications   ?Medication Sig Start Date End Date Taking? Authorizing Provider  ?acetaminophen (TYLENOL) 500 MG tablet Take 500 mg by mouth daily as needed for headache or mild pain.   Yes [provider]  ?albuterol (VENTOLIN HFA) 108 (90 Base) MCG/ACT inhaler Inhale 2 puffs into the lungs every 6 (six) hours as needed for wheezing or shortness of breath. 06/02/21  Yes Myrlene Brokerrawford, Elizabeth A, MD  ?clonazePAM (KLONOPIN) 0.5 MG tablet Take 1 tablet (0.5 mg total) by mouth 2 (two) times daily as needed for anxiety ?Patient taking differently: Take 0.5 mg by mouth at bedtime. 08/21/21 02/17/22 Yes Myrlene Brokerrawford, Elizabeth A, MD  ?gabapentin (NEURONTIN) 100 MG capsule Take 3 capsules (300 mg total) by mouth at bedtime. 09/09/21  Yes Myrlene Brokerrawford, Elizabeth A, MD  ?levocetirizine (XYZAL) 5 MG tablet Take 1 tablet (5 mg total) by mouth daily. ?Patient taking differently: Take 5 mg by mouth every evening. 08/21/21  Yes Myrlene Brokerrawford, Elizabeth A, MD  ?Nutritional Supplements (FEEDING SUPPLEMENT, BOOST BREEZE,) LIQD Take 237 mLs by mouth daily. 08/21/21  Yes Myrlene Brokerrawford, Elizabeth A, MD  ?pantoprazole (PROTONIX) 40 MG tablet Take 1 tablet (40 mg total) by mouth 2 (two) times daily. ?Patient taking differently: Take 40 mg by  mouth daily. 02/23/21  Yes Myrlene Brokerrawford, Elizabeth A, MD  ?Polyethyl Glycol-Propyl Glycol (SYSTANE OP) Place 1 drop into both eyes daily as needed (dry eyes).   Yes [provider]  ?propranolol ER (INDERAL LA) 60 MG 24 hr capsule Take 1 capsule (60 mg total) by mouth daily. 05/26/21  Yes Myrlene Brokerrawford, Elizabeth A, MD  ?rifaximin (XIFAXAN) 550 MG TABS tablet Take 1 tablet (550 mg total) by mouth 3 (three) times daily for 14 days. ?Patient taking differently: Take 550 mg by mouth See admin instructions. Tid x 14 days 10/27/21 11/10/21 Yes Imogene Burnorsey, Ying C, MD  ?rosuvastatin (CRESTOR) 5 MG tablet Take 1 tablet (5 mg total) by mouth at bedtime. 08/21/21  Yes Myrlene Brokerrawford, Elizabeth A, MD  ?Knox RoyaltySYMBICORT 160-4.5 MCG/ACT inhaler Inhale 2 puffs into the lungs 2 (two) times daily. 08/21/21  Yes Myrlene Brokerrawford, Elizabeth A, MD  ?valACYclovir (VALTREX) 1000 MG  tablet Take 1,000 mg by mouth daily.   Yes [provider]  ?XIIDRA 5 % SOLN Place 1 drop into both eyes 2 (two) times daily. ?Patient not taking: Reported on 11/04/2021 09/15/21     ? ? ?Physical Exam: ?Vitals:  ? 11/04/21 0730 11/04/21 0800 11/04/21 0830 11/04/21 0900  ?BP: (!) 154/68 (!) 158/68 (!) 175/79 (!) 166/72  ?Pulse: 91 97 87 95  ?Resp: 20 15 (!) 21 18  ?Temp:      ?TempSrc:      ?SpO2: 94% 96% 96% 99%  ? ?Exam ? ?Constitutional: Frail elderly female who appears to be in no acute distress at this time ?Eyes: lids and conjunctiva normal. ?ENMT: Mucous membranes are moist.  ?Neck: normal, supple  ?Respiratory: clear to auscultation bilaterally, no wheezing, no crackles. Normal respiratory effort. No accessory muscle use.  ?Cardiovascular: Regular rate and rhythm, no murmurs / rubs / gallops.No lower extremity edema appreciated. ?Abdomen: Mild abdominal distention noted with tenderness palpation on the left lower quadrant.  Bowel sounds were present ?Musculoskeletal: no clubbing / cyanosis. No joint deformity upper and lower extremities. Good ROM, no contractures. Normal muscle  tone.  ?Skin: Bruising noted on the right side of the right side of forehead with significant bruising noted of the bilateral upper extremities. ?Neurologic: CN 2-12 grossly intact. Appears able to move all extremiti

## 2021-11-04 NOTE — Progress Notes (Signed)
Sepsis tracking by eLINK 

## 2021-11-04 NOTE — ED Notes (Signed)
Pt remains out of room in CT at this time.  ?

## 2021-11-04 NOTE — Progress Notes (Signed)
SMOG enema given per orders, pt able to tolerate 200-227ml, moderate stool at this time, brown, soft, may need to repeat, pt repositioned, extra chucks and padding in place for leakage or incontinence, call bell in reach, will monitor closely  ?

## 2021-11-04 NOTE — ED Notes (Signed)
ED Provider at bedside. 

## 2021-11-04 NOTE — Progress Notes (Signed)
Notified provider of need to order repeat lactic acid. ° °

## 2021-11-05 ENCOUNTER — Encounter (HOSPITAL_COMMUNITY): Payer: Self-pay | Admitting: Internal Medicine

## 2021-11-05 ENCOUNTER — Inpatient Hospital Stay (HOSPITAL_COMMUNITY): Payer: Medicare Other

## 2021-11-05 DIAGNOSIS — J189 Pneumonia, unspecified organism: Secondary | ICD-10-CM | POA: Diagnosis not present

## 2021-11-05 LAB — BASIC METABOLIC PANEL
Anion gap: 14 (ref 5–15)
BUN: 15 mg/dL (ref 8–23)
CO2: 18 mmol/L — ABNORMAL LOW (ref 22–32)
Calcium: 8.4 mg/dL — ABNORMAL LOW (ref 8.9–10.3)
Chloride: 102 mmol/L (ref 98–111)
Creatinine, Ser: 0.92 mg/dL (ref 0.44–1.00)
GFR, Estimated: 60 mL/min — ABNORMAL LOW (ref >60)
Glucose, Bld: 92 mg/dL (ref 70–99)
Potassium: 4 mmol/L (ref 3.5–5.1)
Sodium: 134 mmol/L — ABNORMAL LOW (ref 135–145)

## 2021-11-05 LAB — CBC
HCT: 31.7 % — ABNORMAL LOW (ref 36.0–46.0)
Hemoglobin: 10.5 g/dL — ABNORMAL LOW (ref 12.0–15.0)
MCH: 35 pg — ABNORMAL HIGH (ref 26.0–34.0)
MCHC: 33.1 g/dL (ref 30.0–36.0)
MCV: 105.7 fL — ABNORMAL HIGH (ref 80.0–100.0)
Platelets: 170 10*3/uL (ref 150–400)
RBC: 3 MIL/uL — ABNORMAL LOW (ref 3.87–5.11)
RDW: 13 % (ref 11.5–15.5)
WBC: 10.9 10*3/uL — ABNORMAL HIGH (ref 4.0–10.5)
nRBC: 0 % (ref 0.0–0.2)

## 2021-11-05 MED ORDER — CEFTRIAXONE SODIUM 1 G IJ SOLR
1.0000 g | INTRAMUSCULAR | Status: DC
Start: 2021-11-05 — End: 2021-11-10
  Administered 2021-11-05 – 2021-11-09 (×5): 1 g via INTRAVENOUS
  Filled 2021-11-05 (×5): qty 10

## 2021-11-05 MED ORDER — AZITHROMYCIN 500 MG PO TABS
500.0000 mg | ORAL_TABLET | Freq: Every day | ORAL | Status: AC
  Administered 2021-11-06 – 2021-11-08 (×3): 500 mg via ORAL
  Filled 2021-11-05 (×3): qty 1

## 2021-11-05 NOTE — Assessment & Plan Note (Signed)
She is having more anxiety. Using clonazepam 0.5 mg BID prn and not well controlled. She has tried zoloft (she believes) in the past with side effects and is extremely hesitant to try a controlling medication for anxiety. Her nephew and his wife are her main support as she recently lost her sister which is very hard on her. ?

## 2021-11-05 NOTE — Assessment & Plan Note (Signed)
Checking CMP as low K at last lab draw as well as magnesium level.  ?

## 2021-11-05 NOTE — Progress Notes (Signed)
?PROGRESS NOTE ? ? ? ?Tina Riley  JJO:[id number] DOB: 15-[name]-1932 DOA: 11/03/2021 ?PCP: Myrlene Broker, MD  ? ?Brief Narrative: 88 year old female lives alone at home admitted status post fall.  She has a history of hypertension IBS depression asthma small bowel obstruction and GERD.  Patient also had complaints of dysphagia feeling of food getting stuck in her throat.  She has history of esophageal stretching and done in the past.  She was due to follow-up with GI Dr. Leonides Schanz as an outpatient.  Her p.o. intake has been poor due to nausea and food getting stuck in her throat. ? ?Assessment & Plan: ?  ?Principal Problem: ?  CAP (community acquired pneumonia) ?Active Problems: ?  Sepsis secondary to UTI [facility]) ?  Asthma ?  GERD (gastroesophageal reflux disease) ?  Fall at home, initial encounter ?  Abdominal pain ?  Constipation ?  Renal insufficiency ?  Pulmonary nodule ? ? ?#1 sepsis secondary to urinary tract infection patient was admitted after a fall at home.  She was tachypneic and tachycardic with lactic acidosis and leukocytosis.  Prime preliminary UA was consistent with UTI.  Urine culture grew Citrobacter pansensitive.  She had Citrobacter in January which was untreated. ?Lactic acidosis has resolved ?Leukocytosis improving ?Procalcitonin 0.13 ?Continue azithromycin ?Start Rocephin DC cefepime ? ?#2 community-acquired pneumonia versus aspiration CT chest shows right-sided right upper and lower lobe opacities.  Speech consulted.  ?Patient with history of esophageal disorders and esophageal stretching in the past. ?Continue antibiotics as above. ? ?#3 status post fall at home prior to admission PT OT consult no evidence of acute fractures by CT or x-rays. ? ?#4 constipation chronic continue stool softeners she received smog enema. ? ?#5 AKI improving with IV fluids ? ?#6 essential hypertension continue propranolol ? ?#7 history of hyperlipidemia on Crestor ? ?#8 history of asthma stable continue  nebs ? ?#9 right lower lobe lung nodule 13 mm recommend repeat CT in 3 months ? ?#10 GERD on Protonix ? ?Estimated body mass index is 19.64 kg/m? as calculated from the following: ?  Height as of this encounter: 4\' 11"  (1.499 m). ?  Weight as of this encounter: 44.1 kg. ? ?DVT prophylaxis: Lovenox  ?code Status: DNR ?Family Communication: None at bedside ?Disposition Plan:  Status is: Inpatient ?Remains inpatient appropriate because: Status post fall with pneumonia and UTI ?  ?Consultants:  ?None ? ?Procedures: (None ?Antimicrobials: Rocephin azithromycin ?Subjective: ?Patient is resting in bed she was asleep when I walked in few minutes later she was able to wake up and have a conversation with me overnight staff reports that there was some.'s of confusion she lives at home alone ? ?Objective: ?Vitals:  ? 11/04/21 2112 11/05/21 11/07/21 11/05/21 11/07/21 11/05/21 0914  ?BP: (!) 154/69 (!) 158/70  (!) 142/68  ?Pulse: 98 98  90  ?Resp: 18 18  17   ?Temp: 98 ?F (36.7 ?C) 98 ?F (36.7 ?C)  98.4 ?F (36.9 ?C)  ?TempSrc:      ?SpO2: 100% 96% 93% 94%  ?Weight:      ?Height:      ? ? ?Intake/Output Summary (Last 24 hours) at 11/05/2021 1444 ?Last data filed at 11/05/2021 1350 ?Gross per 24 hour  ?Intake 343.8 ml  ?Output 350 ml  ?Net -6.2 ml  ? ?Filed Weights  ? 11/04/21 1817  ?Weight: 44.1 kg  ? ? ?Examination: ? ?General exam: Appears frail elderly chronically ill looking female ?Respiratory system: Clear to auscultation. Respiratory effort normal. ?Cardiovascular system:  S1 & S2 heard, RRR. No JVD, murmurs, rubs, gallops or clicks. No pedal edema. ?Gastrointestinal system: Abdomen is nondistended, soft and nontender. No organomegaly or masses felt. Normal bowel sounds heard. ?Central nervous system: Mild confusion noted moves all extremities ?Extremities: Symmetric 5 x 5 power. ?Skin: No rashes, lesions or ulcers ?Psychiatry: unable to assess ? ?Data Reviewed: I have personally reviewed following labs and imaging  studies ? ?CBC: ?Recent Labs  ?Lab 11/02/21 ?1601 11/03/21 ?2244 11/04/21 ?1335 11/05/21 ?1021  ?WBC 7.6 17.4* 16.0* 10.9*  ?NEUTROABS  --  13.5*  --   --   ?HGB 13.1 15.4* 13.5 10.5*  ?HCT 39.5 47.0* 41.0 31.7*  ?MCV 106.5* 104.9* 108.8* 105.7*  ?PLT 277.0 333 222 170  ? ?Basic Metabolic Panel: ?Recent Labs  ?Lab 11/02/21 ?1601 11/03/21 ?2244 11/04/21 ?1335 11/05/21 ?0354  ?NA 139 138 139 134*  ?K 4.8 4.1 3.8 4.0  ?CL 102 103 106 102  ?CO2 29 19* 21* 18*  ?GLUCOSE 88 187* 128* 92  ?BUN 17 13 13 15   ?CREATININE 0.95 1.04* 0.98 0.92  ?CALCIUM 9.7 9.1 8.4* 8.4*  ?MG 2.2  --   --   --   ? ?GFR: ?Estimated Creatinine Clearance: 28.8 mL/min (by C-G formula based on SCr of 0.92 mg/dL). ?Liver Function Tests: ?Recent Labs  ?Lab 11/02/21 ?1601 11/03/21 ?2244  ?AST 13 26  ?ALT 8 16  ?ALKPHOS 103 96  ?BILITOT 0.4 1.1  ?PROT 7.4 7.0  ?ALBUMIN 4.4 3.7  ? ?Recent Labs  ?Lab 11/03/21 ?2244  ?LIPASE 28  ? ?No results for input(s): AMMONIA in the last 168 hours. ?Coagulation Profile: ?Recent Labs  ?Lab 11/03/21 ?2244  ?INR 1.1  ? ?Cardiac Enzymes: ?Recent Labs  ?Lab 11/03/21 ?2244 11/04/21 ?1335  ?CKTOTAL 996* 780*  ? ?BNP (last 3 results) ?No results for input(s): PROBNP in the last 8760 hours. ?HbA1C: ?No results for input(s): HGBA1C in the last 72 hours. ?CBG: ?No results for input(s): GLUCAP in the last 168 hours. ?Lipid Profile: ?No results for input(s): CHOL, HDL, LDLCALC, TRIG, CHOLHDL, LDLDIRECT in the last 72 hours. ?Thyroid Function Tests: ?Recent Labs  ?  11/04/21 ?1849  ?TSH 1.338  ? ?Anemia Panel: ?No results for input(s): VITAMINB12, FOLATE, FERRITIN, TIBC, IRON, RETICCTPCT in the last 72 hours. ?Sepsis Labs: ?Recent Labs  ?Lab 11/04/21 ?0333 11/04/21 ?11/06/21 11/04/21 ?1307 11/04/21 ?1335 11/04/21 ?1849  ?PROCALCITON  --   --   --  0.13  --   ?LATICACIDVEN 4.5* 2.9* 1.3  --  1.8  ? ? ?Recent Results (from the past 240 hour(s))  ?Blood Culture (routine x 2)     Status: None (Preliminary result)  ? Collection Time:  11/03/21 10:44 PM  ? Specimen: BLOOD LEFT HAND  ?Result Value Ref Range Status  ? Specimen Description BLOOD LEFT HAND  Final  ? Special Requests   Final  ?  BOTTLES DRAWN AEROBIC AND ANAEROBIC Blood Culture adequate volume  ? Culture   Final  ?  NO GROWTH 2 DAYS ?Performed at [facility], [address] [place], [place] ?  ? Report Status PENDING  Incomplete  ?Urine Culture     Status: Abnormal (Preliminary result)  ? Collection Time: 11/03/21 10:44 PM  ? Specimen: In/Out Cath Urine  ?Result Value Ref Range Status  ? Specimen Description IN/OUT CATH URINE  Final  ? Special Requests   Final  ?  NONE ?Performed at [facility], [address] [place], [place]  [id number] ?  ? Culture >=100,000 COLONIES/mL GRAM NEGATIVE RODS (A)  Final  ? Report Status PENDING  Incomplete  ?Resp Panel by RT-PCR (Flu A&B, Covid)     Status: None  ? Collection Time: 11/03/21 10:44 PM  ? Specimen: Nasopharyngeal(NP) swabs in vial transport medium  ?Result Value Ref Range Status  ? SARS Coronavirus 2 by RT PCR NEGATIVE NEGATIVE Final  ?  Comment: (NOTE) ?SARS-CoV-2 target nucleic acids are NOT DETECTED. ? ?The SARS-CoV-2 RNA is generally detectable in upper respiratory ?specimens during the acute phase of infection. The lowest ?concentration of SARS-CoV-2 viral copies this assay can detect is ?138 copies/mL. A negative result does not preclude SARS-Cov-2 ?infection and should not be used as the sole basis for treatment or ?other patient management decisions. A negative result may occur with  ?improper specimen collection/handling, submission of specimen other ?than nasopharyngeal swab, presence of viral mutation(s) within the ?areas targeted by this assay, and inadequate number of viral ?copies(<138 copies/mL). A negative result must be combined with ?clinical observations, patient history, and epidemiological ?information. The expected result is Negative. ? ?Fact Sheet for Patients:   ?BloggerCourse.comhttps://www.fda.gov/media/152166/download ? ?Fact Sheet for Healthcare Providers:  ?SeriousBroker.ithttps://www.fda.gov/media/152162/download ? ?This test is no t yet approved or cleared by the [place] FDA and  ?has been authorized for detection and/or diagnosis of S

## 2021-11-05 NOTE — Plan of Care (Signed)
?  Problem: Education: ?Goal: Knowledge of General Education information will improve ?Description: Including pain rating scale, medication(s)/side effects and non-pharmacologic comfort measures ?Outcome: Progressing ?  ?Problem: Clinical Measurements: ?Goal: Ability to maintain clinical measurements within normal limits will improve ?Outcome: Progressing ?Goal: Will remain free from infection ?Outcome: Progressing ?Goal: Diagnostic test results will improve ?Outcome: Progressing ?Goal: Respiratory complications will improve ?Outcome: Progressing ?Goal: Cardiovascular complication will be avoided ?Outcome: Progressing ?  ?Problem: Activity: ?Goal: Risk for activity intolerance will decrease ?Outcome: Progressing ?  ?Problem: Nutrition: ?Goal: Adequate nutrition will be maintained ?Outcome: Progressing ?  ?Problem: Elimination: ?Goal: Will not experience complications related to bowel motility ?Outcome: Progressing ?Goal: Will not experience complications related to urinary retention ?Outcome: Progressing ?  ?Problem: Safety: ?Goal: Ability to remain free from injury will improve ?Outcome: Progressing ?  ?Problem: Skin Integrity: ?Goal: Risk for impaired skin integrity will decrease ?Outcome: Progressing ?  ?

## 2021-11-05 NOTE — Progress Notes (Signed)
Pt more alert and orientated this am, able to answer most questions appropriately, disorientated to time only, pt more cooperative, pt bathe, repositioned, and oral care given. Call bell in reach, will continue to monitor  ?

## 2021-11-05 NOTE — Assessment & Plan Note (Addendum)
Checking CMP and CBC due to fatigue. Checking TSH to help check for cause.  ?

## 2021-11-05 NOTE — Progress Notes (Signed)
[facility] night cross cover note: ? ?I was notified by RN that patient appears more confused this evening.  ? ?Per my chart review, including review of most recent rounding hospitalist documentation, this is an 88 year old female who was admitted during dayshift on 11/04/2021 for suspected urinary tract infection as well as pneumonia, noting continuation of associated broad-spectrum IV antibiotic coverage, including cefepime and azithromycin. ? ?Patient's family conveys their perception that the patient appears slightly more confused tonight than she was during dayshift, noting that the patient is also not as verbal as she was during the day.  Per my discussions with RN, the patient is moving all 4 extremities, and does not appear to be in any acute distress.  Vital signs are noted to be stable. ? ?This patient appears to have multiple risk factors for overnight delirium, including age, hospitalization, as well as multiple potential metabolic factors predisposing her to overnight confusion, including that of suspected presenting urinary tract infection as well as pneumonia.  No overt evidence of acute focal neurologic deficits at this time. however, will continue to closely monitor, including via every 4 hours neurochecks x3 occurrences, for which I have placed a corresponding order. ? ? ? ?Newton Pigg, DO ?Hospitalist ? ?

## 2021-11-05 NOTE — Assessment & Plan Note (Signed)
She does remain hesitant about eating certain foods depending on colon attacks due to thinking they are triggering. Encouraged to make sure she is drinking fluids.  ?

## 2021-11-05 NOTE — Assessment & Plan Note (Signed)
Ongoing concern, she has just started rifaximin from GI and is trying this. We talked about this will only help with certain things and may or may not help her symptoms. Encouraged to drink plenty of fluids after diarrhea.  ?

## 2021-11-06 ENCOUNTER — Inpatient Hospital Stay (HOSPITAL_COMMUNITY): Payer: Medicare Other

## 2021-11-06 DIAGNOSIS — J189 Pneumonia, unspecified organism: Secondary | ICD-10-CM | POA: Diagnosis not present

## 2021-11-06 LAB — URINE CULTURE: Culture: >=100000 — AB

## 2021-11-06 MED ORDER — PHENAZOPYRIDINE HCL 100 MG PO TABS
100.0000 mg | ORAL_TABLET | Freq: Every day | ORAL | Status: AC
  Administered 2021-11-06 – 2021-11-07 (×2): 100 mg via ORAL
  Filled 2021-11-06 (×2): qty 1

## 2021-11-06 MED ORDER — CLONAZEPAM 0.5 MG PO TABS
0.5000 mg | ORAL_TABLET | Freq: Two times a day (BID) | ORAL | Status: DC
Start: 2021-11-06 — End: 2021-11-10
  Administered 2021-11-06 – 2021-11-10 (×8): 0.5 mg via ORAL
  Filled 2021-11-06 (×8): qty 1

## 2021-11-06 NOTE — Plan of Care (Signed)
?  Problem: Education: ?Goal: Knowledge of General Education information will improve ?Description: Including pain rating scale, medication(s)/side effects and non-pharmacologic comfort measures ?Outcome: Progressing ?  ?Problem: Clinical Measurements: ?Goal: Ability to maintain clinical measurements within normal limits will improve ?Outcome: Progressing ?Goal: Will remain free from infection ?Outcome: Progressing ?Goal: Diagnostic test results will improve ?Outcome: Progressing ?Goal: Respiratory complications will improve ?Outcome: Progressing ?Goal: Cardiovascular complication will be avoided ?Outcome: Progressing ?  ?Problem: Activity: ?Goal: Risk for activity intolerance will decrease ?Outcome: Progressing ?  ?Problem: Nutrition: ?Goal: Adequate nutrition will be maintained ?Outcome: Progressing ?  ?Problem: Elimination: ?Goal: Will not experience complications related to bowel motility ?Outcome: Progressing ?Goal: Will not experience complications related to urinary retention ?Outcome: Progressing ?  ?Problem: Safety: ?Goal: Ability to remain free from injury will improve ?Outcome: Progressing ?  ?Problem: Skin Integrity: ?Goal: Risk for impaired skin integrity will decrease ?Outcome: Progressing ?  ?

## 2021-11-06 NOTE — Evaluation (Signed)
Physical Therapy Evaluation ?Patient Details ?Name: Tina Riley ?MRN: [id number] ?DOB: Oct 08, 1931 ?Today's Date: 11/06/2021 ? ?History of Present Illness ? 88 y/o female presented to ED on 11/03/21 after being found down and AMS. Admitted for sepsis 2/2 UTI. CT concerned for R sided upper and lobe PNA. PMH: HTN, asthma, IBS  ?Clinical Impression ? Patient admitted with the above findings. Patient presents with generalized weakness, impaired balance, and decreased activity tolerance. Patient has fear of falling. Patient requires minA for bed mobility and minA to stand from EOB. Patient with fecal incontinence upon standing requiring return to bed for pericare. Deferred further mobility due to transport present to take patient down to swallow study. PTA, patient lives alone and was independent with RW. Family unable to provide 24 hour assistance at discharge. Therefore, recommend SNF at discharge to maximize functional independence prior to returning to her apartment alone.  ?   ? ?Recommendations for follow up therapy are one component of a multi-disciplinary discharge planning process, led by the attending physician.  Recommendations may be updated based on patient status, additional functional criteria and insurance authorization. ? ?Follow Up Recommendations Skilled nursing-short term rehab (<3 hours/day) ? ?  ?Assistance Recommended at Discharge Frequent or constant Supervision/Assistance  ?Patient can return home with the following ? A little help with walking and/or transfers;A little help with bathing/dressing/bathroom;Assistance with cooking/housework;Assist for transportation ? ?  ?Equipment Recommendations None recommended by PT  ?Recommendations for Other Services ? OT consult  ?  ?Functional Status Assessment Patient has had a recent decline in their functional status and demonstrates the ability to make significant improvements in function in a reasonable and predictable amount of time.  ? ?   ?Precautions / Restrictions Precautions ?Precautions: Fall ?Precaution Comments: dizziness (check orthostatics) ?Restrictions ?Weight Bearing Restrictions: No  ? ?  ? ?Mobility ? Bed Mobility ?Overal bed mobility: Needs Assistance ?Bed Mobility: Supine to Sit, Sit to Supine, Rolling ?Rolling: Mod assist ?  ?Supine to sit: Min assist ?Sit to supine: Min assist ?  ?General bed mobility comments: minA for truncal assist and bringing LEs back into bed. increased time to complete and use of bed rails as patient is fearful of falling. Rolling L/R for pericare post bowel movement with modA ?  ? ?Transfers ?Overall transfer level: Needs assistance ?Equipment used: Rolling Emmitt Matthews (2 wheels) ?Transfers: Sit to/from Stand ?Sit to Stand: Min assist ?  ?  ?  ?  ?  ?General transfer comment: minA to come into standing but fecal incontinence upon standing so deferred further mobility ?  ? ?Ambulation/Gait ?  ?  ?  ?  ?  ?  ?  ?General Gait Details: deferred due to incontinent episode and transport present to take patient down for swallow study ? ?Stairs ?  ?  ?  ?  ?  ? ?Wheelchair Mobility ?  ? ?Modified Rankin (Stroke Patients Only) ?  ? ?  ? ?Balance Overall balance assessment: Needs assistance ?Sitting-balance support: No upper extremity supported, Feet supported ?Sitting balance-Leahy Scale: Fair ?  ?  ?Standing balance support: Bilateral upper extremity supported, Reliant on assistive device for balance ?Standing balance-Leahy Scale: Poor ?Standing balance comment: reliant on RW ?  ?  ?  ?  ?  ?  ?  ?  ?  ?  ?  ?   ? ? ? ?Pertinent Vitals/Pain Pain Assessment ?Pain Assessment: Faces ?Faces Pain Scale: Hurts little more ?Pain Location: abdomen ?Pain Descriptors / Indicators: Grimacing, Guarding ?Pain Intervention(s): Monitored  during session, Repositioned  ? ? ?Home Living Family/patient expects to be discharged to:: Private residence ?Living Arrangements: Alone ?Available Help at Discharge: Family;Available  PRN/intermittently ?Type of Home: Apartment ?Home Access: Ramped entrance ?  ?  ?  ?Home Layout: One level ?Home Equipment: Grab bars - toilet;Grab bars - tub/shower;Rolling Mavi Un (2 wheels);Shower seat ?   ?  ?Prior Function Prior Level of Function : Independent/Modified Independent ?  ?  ?  ?  ?  ?  ?Mobility Comments: using E[place]ia Grand, does not drive ?ADLs Comments: cleans bathroom and does laundry. family assists with other household chores, getting groceries ?  ? ? ?Hand Dominance  ?   ? ?  ?Extremity/Trunk Assessment  ? Upper Extremity Assessment ?Upper Extremity Assessment: Generalized weakness ?  ? ?Lower Extremity Assessment ?Lower Extremity Assessment: Generalized weakness ?  ? ?Cervical / Trunk Assessment ?Cervical / Trunk Assessment: Kyphotic  ?Communication  ? Communication: No difficulties  ?Cognition Arousal/Alertness: Awake/alert ?Behavior During Therapy: [facility] for tasks assessed/performed ?Overall Cognitive Status: No family/caregiver present to determine baseline cognitive functioning ?  ?  ?  ?  ?  ?  ?  ?  ?  ?  ?  ?  ?  ?  ?  ?  ?General Comments: seems WFL for tasks assessed. Significant fear of falling ?  ?  ? ?  ?General Comments   ? ?  ?Exercises    ? ?Assessment/Plan  ?  ?PT Assessment Patient needs continued PT services  ?PT Problem List Decreased strength;Decreased activity tolerance;Decreased balance;Decreased mobility ? ?   ?  ?PT Treatment Interventions DME instruction;Therapeutic activities;Functional mobility training;Gait training;Therapeutic exercise;Balance training;Patient/family education   ? ?PT Goals (Current goals can be found in the Care Plan section)  ?Acute Rehab PT Goals ?Patient Stated Goal: to get stronger and be independent ?PT Goal Formulation: With patient ?Time For Goal Achievement: 11/20/21 ?Potential to Achieve Goals: Good ? ?  ?Frequency Min 3X/week ?  ? ? ?Co-evaluation   ?  ?  ?  ?  ? ? ?  ?AM-PAC PT "6 Clicks" Mobility  ?Outcome Measure Help needed turning from  your back to your side while in a flat bed without using bedrails?: A Lot ?Help needed moving from lying on your back to sitting on the side of a flat bed without using bedrails?: A Little ?Help needed moving to and from a bed to a chair (including a wheelchair)?: A Little ?Help needed standing up from a chair using your arms (e.g., wheelchair or bedside chair)?: A Little ?Help needed to walk in hospital room?: A Lot ?Help needed climbing 3-5 steps with a railing? : A Lot ?6 Click Score: 15 ? ?  ?End of Session Equipment Utilized During Treatment: Gait belt ?Activity Tolerance: Patient tolerated treatment well ?Patient left: in bed;with call bell/phone within reach;with bed alarm set ?Nurse Communication: Mobility status ?PT Visit Diagnosis: Unsteadiness on feet (R26.81);History of falling (Z91.81);Muscle weakness (generalized) (M62.81);Other abnormalities of gait and mobility (R26.89) ?  ? ?Time: 3212-2482 ?PT Time Calculation (min) (ACUTE ONLY): 26 min ? ? ?Charges:   PT Evaluation ?$PT Eval Moderate Complexity: 1 Mod ?PT Treatments ?$Therapeutic Activity: 8-22 mins ?  ?   ? ? ?Marykate Heuberger A. Dan Humphreys, PT, DPT ?Acute Rehabilitation Services ?Pager [phone] ?Office [phone] ? ? ?Quintasia Theroux A Zain Lankford ?11/06/2021, 9:12 AM ? ?

## 2021-11-06 NOTE — Progress Notes (Addendum)
Modified Barium Swallow Progress Note ? ?Patient Details  ?Name: [name] ?MRN: [id number] ?Date of Birth: 03-Sep-1931 ? ?Today's Date: 11/06/2021 ? ?Modified Barium Swallow completed.  Full report located under Chart Review in the Imaging Section. ? ?Brief recommendations include the following: ? ?Clinical Impression ? 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  Oral phase was WNL. Laryngeal vestibule closure was reliable.  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  Provided with a handout addressing tips for coping with esophageal deficits. No further SLP f/u is warranted. D/W pt, RN. ? ?Addendum: pt prefers to remain on full liquids - advance at her discretion. ?  ?Swallow Evaluation Recommendations ? ?   ? ? SLP Diet Recommendations: Dysphagia 3 (Mech soft) solids ? ? Liquid Administration via: Cup;Straw ? ? Medication Administration: Whole meds with liquid ? ? Supervision: Patient able to self feed ? ? Compensations: Follow solids with liquid ? ? Postural Changes: Remain semi-upright after after feeds/meals (Comment) ? ? Oral Care Recommendations: Oral care BID ? ?   ? ?An After Visit Summary was printed and given to the patient. ? ? ?[name] ?11/06/2021,11:56 AM ?

## 2021-11-06 NOTE — TOC Initial Note (Signed)
Transition of Care (TOC) - Initial/Assessment Note  ? ? ?Patient Details  ?Name: Tina Riley ?MRN: [id number] ?Date of Birth: 08-May-1932 ? ?Transition of Care (TOC) CM/SW Contact:    ?Paulene Floor Aili Casillas, LCSWA ?Phone Number: ?11/06/2021, 3:20 PM ? ?Clinical Narrative:                 ?CSW received consult for SNF placement and met with the patient at bedside.  The patient was alert and oriented during encounter.  CSW and patient discussed PT's recommendation for SNF placement prior to the patient returning home.  The patient reported that because of a negative previous experience at a SNF, the patient does not want to go to another SNF.  The patient also reported concerns with paying for the stay at the facility if she has too stay past 20 days.  CSW inquired about natural supports and the patient reported that her nephew is her POA.  The patient had a son who died in 10-15-2016.  The patient reported that she would not have anyone who could be with her 24/7 at home.  The patient requested to "pray" about the situation and then the patient would be able to decide if she would be willing to go to a SNF. ? ?TOC will continue to follow.  ? ?Expected Discharge Plan: [place] ?Barriers to Discharge: Ship broker, Continued Medical Work up, SNF Pending bed offer, Financial Resources ? ? ?Patient Goals and CMS Choice ?  ?CMS Medicare.gov Compare Post Acute Care list provided to:: Patient ?Choice offered to / list presented to : Patient ? ?Expected Discharge Plan and Services ?Expected Discharge Plan: [place] ?  ?  ?  ?Living arrangements for the past 2 months: Apartment ?                ?  ?  ?  ?  ?  ?  ?  ?  ?  ?  ? ?Prior Living Arrangements/Services ?Living arrangements for the past 2 months: Apartment ?Lives with:: Self ?Patient language and need for interpreter reviewed:: Yes ?Do you feel safe going back to the place where you live?: Yes      ?Need for Family Participation in  Patient Care: Yes (Comment) ?Care giver support system in place?: Yes (comment) ?  ?Criminal Activity/Legal Involvement Pertinent to Current Situation/Hospitalization: No - Comment as needed ? ?Activities of Daily Living ?Home Assistive Devices/Equipment: None ?ADL Screening (condition at time of admission) ?Patient's cognitive ability adequate to safely complete daily activities?: No ?Is the patient deaf or have difficulty hearing?: No ?Does the patient have difficulty seeing, even when wearing glasses/contacts?: No ?Does the patient have difficulty concentrating, remembering, or making decisions?: Yes ?Patient able to express need for assistance with ADLs?: Yes ?Does the patient have difficulty dressing or bathing?: No ?Independently performs ADLs?: Yes (appropriate for developmental age) ?Walks in Home: Independent ?Does the patient have difficulty walking or climbing stairs?: No ?Weakness of Legs: Both ?Weakness of Arms/Hands: Both ? ?Permission Sought/Granted ?  ?  ?   ?   ?   ?   ? ?Emotional Assessment ?Appearance:: Appears older than stated age ?Attitude/Demeanor/Rapport: Engaged, Crying, Gracious ?Affect (typically observed): Appropriate, Depressed, Anxious, Pleasant, Tearful/Crying ?Orientation: : Oriented to Place, Oriented to Self, Oriented to Situation ?Alcohol / Substance Use: Not Applicable ?Psych Involvement: No (comment) ? ?Admission diagnosis:  Lower urinary tract infectious disease [N39.0] ?CAP (community acquired pneumonia) [J18.9] ?Patient Active Problem List  ? Diagnosis Date Noted  ?  CAP (community acquired pneumonia) 11/04/2021  ? Sepsis secondary to UTI ([place]) 11/04/2021  ? Fall at home, initial encounter 11/04/2021  ? Abdominal pain 11/04/2021  ? Constipation 11/04/2021  ? Renal insufficiency 11/04/2021  ? Pulmonary nodule 11/04/2021  ? Dysphagia 09/18/2021  ? Hypokalemia 08/14/2021  ? Cognitive change 08/14/2021  ? Hyponatremia 08/12/2021  ? Abnormal urine 08/11/2021  ? SBO (small bowel  obstruction) ([place]) 08/10/2021  ? Anxiety 02/24/2021  ? GERD (gastroesophageal reflux disease) 02/24/2021  ? Hair thinning 12/25/2020  ? HSV (herpes simplex virus) infection 10/17/2020  ? Allergic rhinitis 10/17/2020  ? Hyperlipidemia 10/17/2020  ? Diarrhea 10/17/2020  ? Asthma 10/17/2020  ? Insomnia 10/17/2020  ? Poor appetite 10/17/2020  ? Tremor 10/17/2020  ? ?PCP:  Hoyt Koch, MD ?Pharmacy:   ?[facility] ?[address] ?[place] [address] ?Phone: [phone] Fax: [phone] ? ? ? ? ?Social Determinants of Health (SDOH) Interventions ?  ? ?Readmission Risk Interventions ?   ? View : No data to display.  ?  ?  ?  ? ? ? ?

## 2021-11-06 NOTE — Progress Notes (Signed)
?PROGRESS NOTE ? ? ? ?Tina Riley  ZDG:[id number] DOB: Apr 06, 1932 DOA: 11/03/2021 ?PCP: Myrlene Broker, MD  ? ?Brief Narrative: 88 year old female lives alone at home admitted status post fall.  She has a history of hypertension IBS depression asthma small bowel obstruction and GERD.  Patient also had complaints of dysphagia feeling of food getting stuck in her throat.  She has history of esophageal stretching and done in the past.  She was due to follow-up with GI Dr. Leonides Schanz as an outpatient.  Her p.o. intake has been poor due to nausea and food getting stuck in her throat. ? ?Assessment & Plan: ?  ?Principal Problem: ?  CAP (community acquired pneumonia) ?Active Problems: ?  Sepsis secondary to UTI [facility]) ?  Asthma ?  GERD (gastroesophageal reflux disease) ?  Fall at home, initial encounter ?  Abdominal pain ?  Constipation ?  Renal insufficiency ?  Pulmonary nodule ? ? ?#1 sepsis secondary to urinary tract infection patient was admitted after a fall at home.  She was tachypneic and tachycardic with lactic acidosis and leukocytosis.  Prime preliminary UA was consistent with UTI.  Urine culture grew Citrobacter pansensitive.  She had Citrobacter in January which was untreated. ?Lactic acidosis has resolved ?Leukocytosis improving ?Procalcitonin 0.13 ?Continue azithromycin ?Continue antibiotics for 5 days and then DC. ?Start Rocephin DC cefepime ? ?#2 community-acquired pneumonia versus aspiration CT chest shows right-sided right upper and lower lobe opacities.  Speech consulted.  ?Patient with history of esophageal disorders and esophageal stretching in the past. ?Continue antibiotics as above. ?Appreciate speech therapy evaluation. ? ?#3 status post fall at home prior to admission PT OT consult no evidence of acute fractures by CT or x-rays. ?Await SNF ? ?#4 constipation chronic continue stool softeners she received smog enema. ? ?#5 AKI improving with IV fluids ? ?#6 essential hypertension continue  propranolol ? ?#7 history of hyperlipidemia on Crestor ? ?#8 history of asthma stable continue nebs ? ?#9 right lower lobe lung nodule 13 mm recommend repeat CT in 3 months ? ?#10 GERD on Protonix ? ?Estimated body mass index is 19.64 kg/m? as calculated from the following: ?  Height as of this encounter: 4\' 11"  (1.499 m). ?  Weight as of this encounter: 44.1 kg. ? ?DVT prophylaxis: Lovenox  ?code Status: DNR ?Family Communication: None at bedside ?Disposition Plan:  Status is: Inpatient ?Remains inpatient appropriate because: Status post fall with pneumonia and UTI ?  ?Consultants:  ?None ? ?Procedures: (None ?Antimicrobials: Rocephin azithromycin ?Subjective: ? ?She is resting in bed awake and alert reports she is very weak and needs help with feeding ? ?Objective: ?Vitals:  ? 11/05/21 2021 11/06/21 0503 11/06/21 0954 11/06/21 1228  ?BP:  (!) 184/83 (!) 154/75   ?Pulse:  83 83   ?Resp:  18 16   ?Temp:  98.4 ?F (36.9 ?C) 97.6 ?F (36.4 ?C)   ?TempSrc:  Oral Oral   ?SpO2: 92% 98% 94% 96%  ?Weight:      ?Height:      ? ? ?Intake/Output Summary (Last 24 hours) at 11/06/2021 1423 ?Last data filed at 11/06/2021 1300 ?Gross per 24 hour  ?Intake 1158.4 ml  ?Output 1050 ml  ?Net 108.4 ml  ? ? ?Filed Weights  ? 11/04/21 1817  ?Weight: 44.1 kg  ? ? ?Examination: ?Bruise on the right forehead ?General exam: Appears frail elderly chronically ill looking female ?Respiratory system: Clear to auscultation. Respiratory effort normal. ?Cardiovascular system: S1 & S2 heard, RRR. No JVD,  murmurs, rubs, gallops or clicks. No pedal edema. ?Gastrointestinal system: Abdomen is nondistended, soft and nontender. No organomegaly or masses felt. Normal bowel sounds heard. ?Central nervous system: Mild confusion noted moves all extremities ?Extremities: Symmetric 5 x 5 power. ?Skin: No rashes, lesions or ulcers ?Psychiatry: unable to assess ? ?Data Reviewed: I have personally reviewed following labs and imaging studies ? ?CBC: ?Recent Labs  ?Lab  11/02/21 ?1601 11/03/21 ?2244 11/04/21 ?1335 11/05/21 ?1021  ?WBC 7.6 17.4* 16.0* 10.9*  ?NEUTROABS  --  13.5*  --   --   ?HGB 13.1 15.4* 13.5 10.5*  ?HCT 39.5 47.0* 41.0 31.7*  ?MCV 106.5* 104.9* 108.8* 105.7*  ?PLT 277.0 333 222 170  ? ? ?Basic Metabolic Panel: ?Recent Labs  ?Lab 11/02/21 ?1601 11/03/21 ?2244 11/04/21 ?1335 11/05/21 ?2774  ?NA 139 138 139 134*  ?K 4.8 4.1 3.8 4.0  ?CL 102 103 106 102  ?CO2 29 19* 21* 18*  ?GLUCOSE 88 187* 128* 92  ?BUN 17 13 13 15   ?CREATININE 0.95 1.04* 0.98 0.92  ?CALCIUM 9.7 9.1 8.4* 8.4*  ?MG 2.2  --   --   --   ? ? ?GFR: ?Estimated Creatinine Clearance: 28.8 mL/min (by C-G formula based on SCr of 0.92 mg/dL). ?Liver Function Tests: ?Recent Labs  ?Lab 11/02/21 ?1601 11/03/21 ?2244  ?AST 13 26  ?ALT 8 16  ?ALKPHOS 103 96  ?BILITOT 0.4 1.1  ?PROT 7.4 7.0  ?ALBUMIN 4.4 3.7  ? ? ?Recent Labs  ?Lab 11/03/21 ?2244  ?LIPASE 28  ? ? ?No results for input(s): AMMONIA in the last 168 hours. ?Coagulation Profile: ?Recent Labs  ?Lab 11/03/21 ?2244  ?INR 1.1  ? ? ?Cardiac Enzymes: ?Recent Labs  ?Lab 11/03/21 ?2244 11/04/21 ?1335  ?CKTOTAL 996* 780*  ? ? ?BNP (last 3 results) ?No results for input(s): PROBNP in the last 8760 hours. ?HbA1C: ?No results for input(s): HGBA1C in the last 72 hours. ?CBG: ?No results for input(s): GLUCAP in the last 168 hours. ?Lipid Profile: ?No results for input(s): CHOL, HDL, LDLCALC, TRIG, CHOLHDL, LDLDIRECT in the last 72 hours. ?Thyroid Function Tests: ?Recent Labs  ?  11/04/21 ?1849  ?TSH 1.338  ? ? ?Anemia Panel: ?No results for input(s): VITAMINB12, FOLATE, FERRITIN, TIBC, IRON, RETICCTPCT in the last 72 hours. ?Sepsis Labs: ?Recent Labs  ?Lab 11/04/21 ?0333 11/04/21 ?11/06/21 11/04/21 ?1307 11/04/21 ?1335 11/04/21 ?1849  ?PROCALCITON  --   --   --  0.13  --   ?LATICACIDVEN 4.5* 2.9* 1.3  --  1.8  ? ? ? ?Recent Results (from the past 240 hour(s))  ?Blood Culture (routine x 2)     Status: None (Preliminary result)  ? Collection Time: 11/03/21 10:44 PM  ?  Specimen: BLOOD LEFT HAND  ?Result Value Ref Range Status  ? Specimen Description BLOOD LEFT HAND  Final  ? Special Requests   Final  ?  BOTTLES DRAWN AEROBIC AND ANAEROBIC Blood Culture adequate volume  ? Culture   Final  ?  NO GROWTH 3 DAYS ?Performed at [facility], [address], [place], [place] ?  ? Report Status PENDING  Incomplete  ?Urine Culture     Status: Abnormal  ? Collection Time: 11/03/21 10:44 PM  ? Specimen: In/Out Cath Urine  ?Result Value Ref Range Status  ? Specimen Description IN/OUT CATH URINE  Final  ? Special Requests   Final  ?  NONE ?Performed at [facility], [address] [place], [place] ?  ?  Culture >=100,000 COLONIES/mL CITROBACTER KOSERI (A)  Final  ? Report Status 11/06/2021 FINAL  Final  ? Organism ID, Bacteria CITROBACTER KOSERI (A)  Final  ?    Susceptibility  ? Citrobacter koseri - MIC*  ?  CEFAZOLIN <=4 SENSITIVE Sensitive   ?  CEFEPIME <=0.12 SENSITIVE Sensitive   ?  CEFTRIAXONE <=0.25 SENSITIVE Sensitive   ?  CIPROFLOXACIN <=0.25 SENSITIVE Sensitive   ?  GENTAMICIN <=1 SENSITIVE Sensitive   ?  IMIPENEM <=0.25 SENSITIVE Sensitive   ?  NITROFURANTOIN <=16 SENSITIVE Sensitive   ?  TRIMETH/SULFA <=20 SENSITIVE Sensitive   ?  PIP/TAZO <=4 SENSITIVE Sensitive   ?  * >=100,000 COLONIES/mL CITROBACTER KOSERI  ?Resp Panel by RT-PCR (Flu A&B, Covid)     Status: None  ? Collection Time: 11/03/21 10:44 PM  ? Specimen: Nasopharyngeal(NP) swabs in vial transport medium  ?Result Value Ref Range Status  ? SARS Coronavirus 2 by RT PCR NEGATIVE NEGATIVE Final  ?  Comment: (NOTE) ?SARS-CoV-2 target nucleic acids are NOT DETECTED. ? ?The SARS-CoV-2 RNA is generally detectable in upper respiratory ?specimens during the acute phase of infection. The lowest ?concentration of SARS-CoV-2 viral copies this assay can detect is ?138 copies/mL. A negative result does not preclude SARS-Cov-2 ?infection and should not be used as the sole basis for treatment or ?other  patient management decisions. A negative result may occur with  ?improper specimen collection/handling, submission of specimen other ?than nasopharyngeal swab, presence of viral mutation(s) within the ?areas

## 2021-11-07 MED ORDER — GABAPENTIN 100 MG PO CAPS
200.0000 mg | ORAL_CAPSULE | Freq: Two times a day (BID) | ORAL | Status: DC
  Administered 2021-11-07 – 2021-11-10 (×7): 200 mg via ORAL
  Filled 2021-11-07 (×7): qty 2

## 2021-11-07 NOTE — Plan of Care (Signed)
?  Problem: Education: ?Goal: Knowledge of General Education information will improve ?Description: Including pain rating scale, medication(s)/side effects and non-pharmacologic comfort measures ?Outcome: Progressing ?  ?Problem: Clinical Measurements: ?Goal: Ability to maintain clinical measurements within normal limits will improve ?Outcome: Progressing ?Goal: Will remain free from infection ?Outcome: Progressing ?Goal: Diagnostic test results will improve ?Outcome: Progressing ?Goal: Respiratory complications will improve ?Outcome: Progressing ?Goal: Cardiovascular complication will be avoided ?Outcome: Progressing ?  ?Problem: Activity: ?Goal: Risk for activity intolerance will decrease ?Outcome: Progressing ?  ?Problem: Nutrition: ?Goal: Adequate nutrition will be maintained ?Outcome: Progressing ?  ?Problem: Elimination: ?Goal: Will not experience complications related to bowel motility ?Outcome: Progressing ?Goal: Will not experience complications related to urinary retention ?Outcome: Progressing ?  ?Problem: Safety: ?Goal: Ability to remain free from injury will improve ?Outcome: Progressing ?  ?Problem: Skin Integrity: ?Goal: Risk for impaired skin integrity will decrease ?Outcome: Progressing ?  ?

## 2021-11-07 NOTE — Progress Notes (Signed)
?PROGRESS NOTE ? ? ? ?Tina Riley  ZOX:[id number] DOB: September 07, 1931 DOA: 11/03/2021 ?PCP: Myrlene Brokerrawford, Chin Wachter A, MD  ? ?Brief Narrative: 88 year old female lives alone at home admitted status post fall.  She has a history of hypertension IBS depression asthma small bowel obstruction and GERD.  Patient also had complaints of dysphagia feeling of food getting stuck in her throat.  She has history of esophageal stretching and done in the past.  She was due to follow-up with GI Dr. Leonides Schanzorsey as an outpatient.  Her p.o. intake has been poor due to nausea and food getting stuck in her throat. ? ?Assessment & Plan: ?  ?Principal Problem: ?  CAP (community acquired pneumonia) ?Active Problems: ?  Sepsis secondary to UTI [facility](HCC) ?  Asthma ?  GERD (gastroesophageal reflux disease) ?  Fall at home, initial encounter ?  Abdominal pain ?  Constipation ?  Renal insufficiency ?  Pulmonary nodule ? ? ?#1 sepsis secondary to urinary tract infection patient was admitted after a fall at home.  She was tachypneic and tachycardic with lactic acidosis and leukocytosis.  Prime preliminary UA was consistent with UTI.  Urine culture grew Citrobacter pansensitive.  She had Citrobacter in January which was untreated. ?Lactic acidosis has resolved ?Leukocytosis improving ?Procalcitonin 0.13 ?Continue azithromycin ?Continue antibiotics for 5 days and then DC. ?Start Rocephin DC cefepime ? ?#2 community-acquired pneumonia versus aspiration CT chest shows right-sided right upper and lower lobe opacities.  Speech consulted.  ?Patient with history of esophageal disorders and esophageal stretching in the past. ?Continue antibiotics as above. ?Appreciate speech therapy evaluation. 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 ?#3 status post fall at home prior to admission PT OT consult no evidence of acute fractures by CT or x-rays. ?Await SNF ? ?#4 constipation resolved ? ?#5 AKI improving with IV  fluids ? ?#6 essential hypertension continue propranolol ? ?#7 history of hyperlipidemia on Crestor ? ?#8 history of asthma stable continue nebs ? ?#9 right lower lobe lung nodule 13 mm recommend repeat CT in 3 months ? ?#10 GERD on Protonix ? ?Estimated body mass index is 19.64 kg/m? as calculated from the following: ?  Height as of this encounter: 4\' 11"  (1.499 m). ?  Weight as of this encounter: 44.1 kg. ? ?DVT prophylaxis: Lovenox  ?code Status: DNR ?Family Communication: None at bedside ?Disposition Plan:  Status is: Inpatient ?Remains inpatient appropriate because: Status post fall with pneumonia and UTI ?  ?Consultants:  ?None ? ?Procedures: (None ?Antimicrobials: Rocephin azithromycin ?Subjective: ?Patient is resting in bed she is requesting for diet to be advanced ?Complains of bilateral lower extremity cramps and restlessness on gabapentin at home ?Objective: ?Vitals:  ? 11/06/21 2101 11/07/21 0505 11/07/21 0818 11/07/21 [id number]  ?BP: (!) 163/82 (!) 182/76  (!) 172/93  ?Pulse: 75 79  70  ?Resp: 19 16  17   ?Temp: 98 ?F (36.7 ?C) 97.9 ?F (36.6 ?C)  98.1 ?F (36.7 ?C)  ?TempSrc: Oral Oral    ?SpO2: 100% 95% 96% 99%  ?Weight:      ?Height:      ? ? ?Intake/Output Summary (Last 24 hours) at 11/07/2021 1302 ?Last data filed at 11/07/2021 0800 ?Gross per 24 hour  ?Intake 620 ml  ?Output 1900 ml  ?Net -1280 ml  ? ? ?Filed Weights  ? 11/04/21 1817  ?Weight: 44.1 kg  ? ? ?Examination: ?Bruise on the right forehead ?General exam: Appears frail elderly chronically ill looking female ?Respiratory system: Clear  to auscultation. Respiratory effort normal. ?Cardiovascular system: S1 & S2 heard, RRR. No JVD, murmurs, rubs, gallops or clicks. No pedal edema. ?Gastrointestinal system: Abdomen is nondistended, soft and nontender. No organomegaly or masses felt. Normal bowel sounds heard. ?Central nervous system: Awake alert oriented  ?extremities: Symmetric 5 x 5 power. ?Skin: No rashes, lesions or ulcers ?Psychiatry: unable to  assess ? ?Data Reviewed: I have personally reviewed following labs and imaging studies ? ?CBC: ?Recent Labs  ?Lab 11/02/21 ?1601 11/03/21 ?2244 11/04/21 ?1335 11/05/21 ?1021  ?WBC 7.6 17.4* 16.0* 10.9*  ?NEUTROABS  --  13.5*  --   --   ?HGB 13.1 15.4* 13.5 10.5*  ?HCT 39.5 47.0* 41.0 31.7*  ?MCV 106.5* 104.9* 108.8* 105.7*  ?PLT 277.0 333 222 170  ? ? ?Basic Metabolic Panel: ?Recent Labs  ?Lab 11/02/21 ?1601 11/03/21 ?2244 11/04/21 ?1335 11/05/21 ?1941  ?NA 139 138 139 134*  ?K 4.8 4.1 3.8 4.0  ?CL 102 103 106 102  ?CO2 29 19* 21* 18*  ?GLUCOSE 88 187* 128* 92  ?BUN 17 13 13 15   ?CREATININE 0.95 1.04* 0.98 0.92  ?CALCIUM 9.7 9.1 8.4* 8.4*  ?MG 2.2  --   --   --   ? ? ?GFR: ?Estimated Creatinine Clearance: 28.8 mL/min (by C-G formula based on SCr of 0.92 mg/dL). ?Liver Function Tests: ?Recent Labs  ?Lab 11/02/21 ?1601 11/03/21 ?2244  ?AST 13 26  ?ALT 8 16  ?ALKPHOS 103 96  ?BILITOT 0.4 1.1  ?PROT 7.4 7.0  ?ALBUMIN 4.4 3.7  ? ? ?Recent Labs  ?Lab 11/03/21 ?2244  ?LIPASE 28  ? ? ?No results for input(s): AMMONIA in the last 168 hours. ?Coagulation Profile: ?Recent Labs  ?Lab 11/03/21 ?2244  ?INR 1.1  ? ? ?Cardiac Enzymes: ?Recent Labs  ?Lab 11/03/21 ?2244 11/04/21 ?1335  ?CKTOTAL 996* 780*  ? ? ?BNP (last 3 results) ?No results for input(s): PROBNP in the last 8760 hours. ?HbA1C: ?No results for input(s): HGBA1C in the last 72 hours. ?CBG: ?No results for input(s): GLUCAP in the last 168 hours. ?Lipid Profile: ?No results for input(s): CHOL, HDL, LDLCALC, TRIG, CHOLHDL, LDLDIRECT in the last 72 hours. ?Thyroid Function Tests: ?Recent Labs  ?  11/04/21 ?1849  ?TSH 1.338  ? ? ?Anemia Panel: ?No results for input(s): VITAMINB12, FOLATE, FERRITIN, TIBC, IRON, RETICCTPCT in the last 72 hours. ?Sepsis Labs: ?Recent Labs  ?Lab 11/04/21 ?0333 11/04/21 ?11/06/21 11/04/21 ?1307 11/04/21 ?1335 11/04/21 ?1849  ?PROCALCITON  --   --   --  0.13  --   ?LATICACIDVEN 4.5* 2.9* 1.3  --  1.8  ? ? ? ?Recent Results (from the past 240 hour(s))   ?Blood Culture (routine x 2)     Status: None (Preliminary result)  ? Collection Time: 11/03/21 10:44 PM  ? Specimen: BLOOD LEFT HAND  ?Result Value Ref Range Status  ? Specimen Description BLOOD LEFT HAND  Final  ? Special Requests   Final  ?  BOTTLES DRAWN AEROBIC AND ANAEROBIC Blood Culture adequate volume  ? Culture   Final  ?  NO GROWTH 4 DAYS ?Performed at [facility], [address], [place], [place] ?  ? Report Status PENDING  Incomplete  ?Urine Culture     Status: Abnormal  ? Collection Time: 11/03/21 10:44 PM  ? Specimen: In/Out Cath Urine  ?Result Value Ref Range Status  ? Specimen Description IN/OUT CATH URINE  Final  ? Special Requests   Final  ?  NONE ?Performed  at [facility], [address], [place], [place] [address] ?  ? Culture >=100,000 COLONIES/mL CITROBACTER KOSERI (A)  Final  ? Report Status 11/06/2021 FINAL  Final  ? Organism ID, Bacteria CITROBACTER KOSERI (A)  Final  ?    Susceptibility  ? Citrobacter koseri - MIC*  ?  CEFAZOLIN <=4 SENSITIVE Sensitive   ?  CEFEPIME <=0.12 SENSITIVE Sensitive   ?  CEFTRIAXONE <=0.25 SENSITIVE Sensitive   ?  CIPROFLOXACIN <=0.25 SENSITIVE Sensitive   ?  GENTAMICIN <=1 SENSITIVE Sensitive   ?  IMIPENEM <=0.25 SENSITIVE Sensitive   ?  NITROFURANTOIN <=16 SENSITIVE Sensitive   ?  TRIMETH/SULFA <=20 SENSITIVE Sensitive   ?  PIP/TAZO <=4 SENSITIVE Sensitive   ?  * >=100,000 COLONIES/mL CITROBACTER KOSERI  ?Resp Panel by RT-PCR (Flu A&B, Covid)     Status: None  ? Collection Time: 11/03/21 10:44 PM  ? Specimen: Nasopharyngeal(NP) swabs in vial transport medium  ?Result Value Ref Range Status  ? SARS Coronavirus 2 by RT PCR NEGATIVE NEGATIVE Final  ?  Comment: (NOTE) ?SARS-CoV-2 target nucleic acids are NOT DETECTED. ? ?The SARS-CoV-2 RNA is generally detectable in upper respiratory ?specimens during the acute phase of infection. The lowest ?concentration of SARS-CoV-2 viral copies this assay can detect is ?138 copies/mL. A negative result  does not preclude SARS-Cov-2 ?infection and should not be used as the sole basis for treatment or ?other patient management decisions. A negative result may occur with  ?improper specimen collection/handling,

## 2021-11-07 NOTE — Evaluation (Signed)
Occupational Therapy Evaluation ?Patient Details ?Name: Tina Riley ?MRN: [id number] ?DOB: 1931/12/07 ?Today's Date: 11/07/2021 ? ? ?History of Present Illness 88 y/o female presented to ED on 11/03/21 after being found down and AMS. Admitted for sepsis 2/2 UTI. CT concerned for R sided upper and lobe PNA. PMH: HTN, asthma, IBS  ? ?Clinical Impression ?  ?Patient admitted for the diagnosis above.  PTA she lives by herself, but does have PRN assist from family.  Currently she is globally weak, and has poor dynamic stand balance.  OT will follow in the acute setting, but post acute rehab at a local SNF is recommended to maximize her functional statu prior to returning home.  Her family is unable to provide the needed 24 hour Mod A required currently.  ?   ? ?Recommendations for follow up therapy are one component of a multi-disciplinary discharge planning process, led by the attending physician.  Recommendations may be updated based on patient status, additional functional criteria and insurance authorization.  ? ?Follow Up Recommendations ? Skilled nursing-short term rehab (<3 hours/day)  ?  ?Assistance Recommended at Discharge Frequent or constant Supervision/Assistance  ?Patient can return home with the following A lot of help with walking and/or transfers;A lot of help with bathing/dressing/bathroom;Direct supervision/assist for medications management;Help with stairs or ramp for entrance;Assist for transportation;Assistance with cooking/housework ? ?  ?Functional Status Assessment ? Patient has had a recent decline in their functional status and demonstrates the ability to make significant improvements in function in a reasonable and predictable amount of time.  ?Equipment Recommendations ? BSC/3in1  ?  ?Recommendations for Other Services   ? ? ?  ?Precautions / Restrictions Precautions ?Precautions: Fall ?Restrictions ?Weight Bearing Restrictions: No  ? ?  ? ?Mobility Bed Mobility ?Overal bed mobility:  Needs Assistance ?Bed Mobility: Supine to Sit ?  ?  ?Supine to sit: Min assist, HOB elevated ?  ?  ?  ?  ? ?Transfers ?Overall transfer level: Needs assistance ?Equipment used: Rolling walker (2 wheels) ?Transfers: Sit to/from Stand, Bed to chair/wheelchair/BSC ?Sit to Stand: Min assist ?Stand pivot transfers: Min assist ?  ?  ?  ?  ?  ?  ? ?  ?Balance Overall balance assessment: Needs assistance ?Sitting-balance support: No upper extremity supported, Feet supported ?Sitting balance-Leahy Scale: Fair ?  ?Postural control: Posterior lean ?Standing balance support: Reliant on assistive device for balance ?Standing balance-Leahy Scale: Poor ?  ?  ?  ?  ?  ?  ?  ?  ?  ?  ?  ?  ?   ? ?ADL either performed or assessed with clinical judgement  ? ?ADL   ?  ?  ?Grooming: Wash/dry hands;Wash/dry face;Min guard;Sitting ?  ?Upper Body Bathing: Min guard;Sitting ?  ?Lower Body Bathing: Moderate assistance;Sit to/from stand ?  ?Upper Body Dressing : Minimal assistance;Sitting ?  ?Lower Body Dressing: Moderate assistance;Sit to/from stand ?  ?Toilet Transfer: Moderate assistance;Stand-pivot;BSC/3in1 ?  ?  ?  ?  ?  ?  ?   ? ? ? ?Vision Baseline Vision/History: 1 Wears glasses ?Patient Visual Report: Blurring of vision ?   ?   ?Perception Perception ?Perception: Within Functional Limits ?  ?Praxis Praxis ?Praxis: Intact ?  ? ?Pertinent Vitals/Pain Pain Assessment ?Pain Assessment: No/denies pain ?Pain Intervention(s): Monitored during session  ? ? ? ?Hand Dominance Right ?  ?Extremity/Trunk Assessment Upper Extremity Assessment ?Upper Extremity Assessment: Overall WFL for tasks assessed ?  ?Lower Extremity Assessment ?Lower Extremity Assessment: Defer to PT  evaluation ?  ?Cervical / Trunk Assessment ?Cervical / Trunk Assessment: Kyphotic ?  ?Communication Communication ?Communication: No difficulties ?  ?Cognition Arousal/Alertness: Awake/alert ?Behavior During Therapy: [facility] for tasks assessed/performed ?Overall Cognitive Status:  Within Functional Limits for tasks assessed ?  ?  ?  ?  ?  ?  ?  ?  ?  ?  ?  ?  ?  ?  ?  ?  ?  ?  ?  ?General Comments   Mild dizziness. ? ?  ?Exercises   ?  ?Shoulder Instructions    ? ? ?Home Living Family/patient expects to be discharged to:: Private residence ?Living Arrangements: Alone ?Available Help at Discharge: Family;Available PRN/intermittently ?Type of Home: Apartment ?Home Access: Ramped entrance ?  ?  ?Home Layout: One level ?  ?  ?Bathroom Shower/Tub: Walk-in shower ?  ?Bathroom Toilet: Handicapped height ?Bathroom Accessibility: Yes ?  ?Home Equipment: Grab bars - toilet;Grab bars - tub/shower;Rolling Walker (2 wheels);Shower seat ?  ?  ?  ? ?  ?Prior Functioning/Environment Prior Level of Function : Independent/Modified Independent ?  ?  ?  ?  ?  ?  ?Mobility Comments: using walker, does not drive ?ADLs Comments: cleans bathroom and does laundry. family assists with other household chores, getting groceries.  No assist with bathing and dressing. ?  ? ?  ?  ?OT Problem List: Decreased strength;Decreased activity tolerance ?  ?   ?OT Treatment/Interventions: Self-care/ADL training;DME and/or AE instruction;Therapeutic activities;Balance training;Patient/family education  ?  ?OT Goals(Current goals can be found in the care plan section) Acute Rehab OT Goals ?Patient Stated Goal: I have to go rehab, I'm weak ?OT Goal Formulation: With patient ?Time For Goal Achievement: 11/20/21 ?Potential to Achieve Goals: Good ?ADL Goals ?Pt Will Perform Grooming: with set-up;sitting ?Pt Will Perform Upper Body Dressing: with set-up;sitting ?Pt Will Perform Lower Body Dressing: with min assist;sit to/from stand ?Pt Will Transfer to Toilet: with min guard assist;ambulating;regular height toilet  ?OT Frequency: Min 2X/week ?  ? ?Co-evaluation   ?  ?  ?  ?  ? ?  ?AM-PAC OT "6 Clicks" Daily Activity     ?Outcome Measure Help from another person eating meals?: None ?Help from another person taking care of personal  grooming?: A Little ?Help from another person toileting, which includes using toliet, bedpan, or urinal?: A Lot ?Help from another person bathing (including washing, rinsing, drying)?: A Lot ?Help from another person to put on and taking off regular upper body clothing?: A Little ?Help from another person to put on and taking off regular lower body clothing?: A Lot ?6 Click Score: 16 ?  ?End of Session Equipment Utilized During Treatment: Rolling walker (2 wheels) ?Nurse Communication: Mobility status ? ?Activity Tolerance: Patient tolerated treatment well ?Patient left: in chair;with call bell/phone within reach ? ?OT Visit Diagnosis: Unsteadiness on feet (R26.81);Muscle weakness (generalized) (M62.81)  ?              ?Time: 9622-2979 ?OT Time Calculation (min): 20 min ?Charges:  OT General Charges ?$OT Visit: 1 Visit ?OT Evaluation ?$OT Eval Moderate Complexity: 1 Mod ? ?11/07/2021 ? ?RP, OTR/L ? ?Acute Rehabilitation Services ? ?Office:  [phone] ? ? ?Azlaan Isidore D Shantoya Geurts ?11/07/2021, 10:30 AM ?

## 2021-11-08 LAB — COMPREHENSIVE METABOLIC PANEL
ALT: 19 U/L (ref 0–44)
AST: 15 U/L (ref 15–41)
Albumin: 2.8 g/dL — ABNORMAL LOW (ref 3.5–5.0)
Alkaline Phosphatase: 62 U/L (ref 38–126)
Anion gap: 9 (ref 5–15)
BUN: 11 mg/dL (ref 8–23)
CO2: 26 mmol/L (ref 22–32)
Calcium: 8.5 mg/dL — ABNORMAL LOW (ref 8.9–10.3)
Chloride: 104 mmol/L (ref 98–111)
Creatinine, Ser: 0.75 mg/dL (ref 0.44–1.00)
GFR, Estimated: >60 mL/min (ref >60)
Glucose, Bld: 102 mg/dL — ABNORMAL HIGH (ref 70–99)
Potassium: 2.8 mmol/L — ABNORMAL LOW (ref 3.5–5.1)
Sodium: 139 mmol/L (ref 135–145)
Total Bilirubin: 0.1 mg/dL — ABNORMAL LOW (ref 0.3–1.2)
Total Protein: 5.3 g/dL — ABNORMAL LOW (ref 6.5–8.1)

## 2021-11-08 LAB — CULTURE, BLOOD (ROUTINE X 2)
Culture: NO GROWTH
Culture: NO GROWTH
Special Requests: ADEQUATE

## 2021-11-08 LAB — CBC
HCT: 36.2 % (ref 36.0–46.0)
Hemoglobin: 12.2 g/dL (ref 12.0–15.0)
MCH: 34.8 pg — ABNORMAL HIGH (ref 26.0–34.0)
MCHC: 33.7 g/dL (ref 30.0–36.0)
MCV: 103.1 fL — ABNORMAL HIGH (ref 80.0–100.0)
Platelets: 228 10*3/uL (ref 150–400)
RBC: 3.51 MIL/uL — ABNORMAL LOW (ref 3.87–5.11)
RDW: 13 % (ref 11.5–15.5)
WBC: 6.6 10*3/uL (ref 4.0–10.5)
nRBC: 0 % (ref 0.0–0.2)

## 2021-11-08 LAB — MAGNESIUM: Magnesium: 1.8 mg/dL (ref 1.7–2.4)

## 2021-11-08 MED ORDER — PSYLLIUM 95 % PO PACK
1.0000 | PACK | Freq: Every day | ORAL | Status: DC
  Administered 2021-11-08 – 2021-11-10 (×2): 1 via ORAL
  Filled 2021-11-08 (×3): qty 1

## 2021-11-08 MED ORDER — ACETAMINOPHEN 160 MG/5ML PO SOLN
650.0000 mg | Freq: Four times a day (QID) | ORAL | Status: DC | PRN
  Administered 2021-11-08 – 2021-11-10 (×4): 650 mg via ORAL
  Filled 2021-11-08 (×4): qty 20.3

## 2021-11-08 NOTE — Progress Notes (Addendum)
?PROGRESS NOTE ? ? ? ?Tina Riley  RFF:[id number] DOB: 1931/10/19 DOA: 11/03/2021 ?PCP: Myrlene Broker, MD  ? ?Brief Narrative: 88 year old female lives alone at home admitted status post fall.  She has a history of hypertension IBS depression asthma small bowel obstruction and GERD.  Patient also had complaints of dysphagia feeling of food getting stuck in her throat.  She has history of esophageal stretching and done in the past.  She was due to follow-up with GI Dr. Leonides Schanz as an outpatient.  Her p.o. intake has been poor due to nausea and food getting stuck in her throat. ? ?Assessment & Plan: ?  ?Principal Problem: ?  CAP (community acquired pneumonia) ?Active Problems: ?  Sepsis secondary to UTI [facility]) ?  Asthma ?  GERD (gastroesophageal reflux disease) ?  Fall at home, initial encounter ?  Abdominal pain ?  Constipation ?  Renal insufficiency ?  Pulmonary nodule ? ? ?#1 sepsis secondary to urinary tract infection patient was admitted after a fall at home.  She was tachypneic and tachycardic with lactic acidosis and leukocytosis.  Prime preliminary UA was consistent with UTI.  Urine culture grew Citrobacter pansensitive.  She had Citrobacter in January which was untreated. ?Lactic acidosis has resolved ?Leukocytosis improving ?Procalcitonin 0.13 ?Continue azithromycin ?Continue antibiotics for 5 days and then DC. ?Start Rocephin DC cefepime ? ?#2 community-acquired pneumonia versus aspiration CT chest shows right-sided right upper and lower lobe opacities.  Speech consulted.  ?Patient with history of esophageal disorders and esophageal stretching in the past. ?Continue antibiotics as above. ?Appreciate speech therapy evaluation. 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 ?#3 status post fall at home prior to admission PT OT consult no evidence of acute fractures by CT or x-rays. ?Await SNF ? ?#4 constipation restart Metamucil ? ?#5  Renal insufficiency  resolved with IV fluids ? ?#6 essential hypertension continue propranolol ? ?#7 history of hyperlipidemia on Crestor ? ?#8 history of asthma stable continue nebs ? ?#9 right lower lobe lung nodule 13 mm recommend repeat CT in 3 months ? ?#10 GERD on Protonix ? ?Estimated body mass index is 19.64 kg/m? as calculated from the following: ?  Height as of this encounter: 4\' 11"  (1.499 m). ?  Weight as of this encounter: 44.1 kg. ? ?DVT prophylaxis: Lovenox  ?code Status: DNR ?Family Communication: None at bedside ?Disposition Plan:  Status is: Inpatient ?Remains inpatient appropriate because: Status post fall with pneumonia and UTI ?  ?Consultants:  ?None ? ?Procedures: (None ?Antimicrobials: Rocephin azithromycin ?Subjective: ? ?C/o constipation ?Objective: ?Vitals:  ? 11/08/21 0446 11/08/21 0805 11/08/21 11/10/21 11/08/21 11/10/21  ?BP: 140/78   (!) 180/94  ?Pulse: 75   73  ?Resp: 18   17  ?Temp: 98 ?F (36.7 ?C)   97.7 ?F (36.5 ?C)  ?TempSrc: Oral   Oral  ?SpO2: 98% 94% 94% 97%  ?Weight:      ?Height:      ? ? ?Intake/Output Summary (Last 24 hours) at 11/08/2021 1344 ?Last data filed at 11/08/2021 1300 ?Gross per 24 hour  ?Intake 780 ml  ?Output 1050 ml  ?Net -270 ml  ? ? ?Filed Weights  ? 11/04/21 1817  ?Weight: 44.1 kg  ? ? ?Examination: ?Bruise on the right forehead ?General exam: Appears frail elderly chronically ill looking female ?Respiratory system: Clear to auscultation. Respiratory effort normal. ?Cardiovascular system: S1 & S2 heard, RRR. No JVD, murmurs, rubs, gallops or clicks. No pedal edema. ?Gastrointestinal system:  Abdomen is nondistended, soft and nontender. No organomegaly or masses felt. Normal bowel sounds heard. ?Central nervous system: Awake alert oriented  ?extremities: Symmetric 5 x 5 power. ?Skin: No rashes, lesions or ulcers ?Psychiatry: unable to assess ? ?Data Reviewed: I have personally reviewed following labs and imaging studies ? ?CBC: ?Recent Labs  ?Lab 11/02/21 ?1601 11/03/21 ?2244 11/04/21 ?1335  11/05/21 ?1021 11/08/21 ?0731  ?WBC 7.6 17.4* 16.0* 10.9* 6.6  ?NEUTROABS  --  13.5*  --   --   --   ?HGB 13.1 15.4* 13.5 10.5* 12.2  ?HCT 39.5 47.0* 41.0 31.7* 36.2  ?MCV 106.5* 104.9* 108.8* 105.7* 103.1*  ?PLT 277.0 333 222 170 228  ? ? ?Basic Metabolic Panel: ?Recent Labs  ?Lab 11/02/21 ?1601 11/03/21 ?2244 11/04/21 ?1335 11/05/21 ?[id number] 11/08/21 ?0731  ?NA 139 138 139 134* 139  ?K 4.8 4.1 3.8 4.0 2.8*  ?CL 102 103 106 102 104  ?CO2 29 19* 21* 18* 26  ?GLUCOSE 88 187* 128* 92 102*  ?BUN 17 13 13 15 11   ?CREATININE 0.95 1.04* 0.98 0.92 0.75  ?CALCIUM 9.7 9.1 8.4* 8.4* 8.5*  ?MG 2.2  --   --   --  1.8  ? ? ?GFR: ?Estimated Creatinine Clearance: 33.2 mL/min (by C-G formula based on SCr of 0.75 mg/dL). ?Liver Function Tests: ?Recent Labs  ?Lab 11/02/21 ?1601 11/03/21 ?2244 11/08/21 ?0731  ?AST 13 26 15   ?ALT 8 16 19   ?ALKPHOS 103 96 62  ?BILITOT 0.4 1.1 0.1*  ?PROT 7.4 7.0 5.3*  ?ALBUMIN 4.4 3.7 2.8*  ? ? ?Recent Labs  ?Lab 11/03/21 ?2244  ?LIPASE 28  ? ? ?No results for input(s): AMMONIA in the last 168 hours. ?Coagulation Profile: ?Recent Labs  ?Lab 11/03/21 ?2244  ?INR 1.1  ? ? ?Cardiac Enzymes: ?Recent Labs  ?Lab 11/03/21 ?2244 11/04/21 ?1335  ?CKTOTAL 996* 780*  ? ? ?BNP (last 3 results) ?No results for input(s): PROBNP in the last 8760 hours. ?HbA1C: ?No results for input(s): HGBA1C in the last 72 hours. ?CBG: ?No results for input(s): GLUCAP in the last 168 hours. ?Lipid Profile: ?No results for input(s): CHOL, HDL, LDLCALC, TRIG, CHOLHDL, LDLDIRECT in the last 72 hours. ?Thyroid Function Tests: ?No results for input(s): TSH, T4TOTAL, FREET4, T3FREE, THYROIDAB in the last 72 hours. ? ?Anemia Panel: ?No results for input(s): VITAMINB12, FOLATE, FERRITIN, TIBC, IRON, RETICCTPCT in the last 72 hours. ?Sepsis Labs: ?Recent Labs  ?Lab 11/04/21 ?0333 11/04/21 ?[id number] 11/04/21 ?1307 11/04/21 ?1335 11/04/21 ?1849  ?PROCALCITON  --   --   --  0.13  --   ?LATICACIDVEN 4.5* 2.9* 1.3  --  1.8  ? ? ? ?Recent Results (from  the past 240 hour(s))  ?Blood Culture (routine x 2)     Status: None  ? Collection Time: 11/03/21 10:44 PM  ? Specimen: BLOOD LEFT HAND  ?Result Value Ref Range Status  ? Specimen Description BLOOD LEFT HAND  Final  ? Special Requests   Final  ?  BOTTLES DRAWN AEROBIC AND ANAEROBIC Blood Culture adequate volume  ? Culture   Final  ?  NO GROWTH 5 DAYS ?Performed at [facility], [address] [place], [place] [address] ?  ? Report Status 11/08/2021 FINAL  Final  ?Urine Culture     Status: Abnormal  ? Collection Time: 11/03/21 10:44 PM  ? Specimen: In/Out Cath Urine  ?Result Value Ref Range Status  ? Specimen Description IN/OUT CATH URINE  Final  ? Special Requests  Final  ?  NONE ?Performed at [facility], [address], [place], [place] [address] ?  ? Culture >=100,000 COLONIES/mL CITROBACTER KOSERI (A)  Final  ? Report Status 11/06/2021 FINAL  Final  ? Organism ID, Bacteria CITROBACTER KOSERI (A)  Final  ?    Susceptibility  ? Citrobacter koseri - MIC*  ?  CEFAZOLIN <=4 SENSITIVE Sensitive   ?  CEFEPIME <=0.12 SENSITIVE Sensitive   ?  CEFTRIAXONE <=0.25 SENSITIVE Sensitive   ?  CIPROFLOXACIN <=0.25 SENSITIVE Sensitive   ?  GENTAMICIN <=1 SENSITIVE Sensitive   ?  IMIPENEM <=0.25 SENSITIVE Sensitive   ?  NITROFURANTOIN <=16 SENSITIVE Sensitive   ?  TRIMETH/SULFA <=20 SENSITIVE Sensitive   ?  PIP/TAZO <=4 SENSITIVE Sensitive   ?  * >=100,000 COLONIES/mL CITROBACTER KOSERI  ?Resp Panel by RT-PCR (Flu A&B, Covid)     Status: None  ? Collection Time: 11/03/21 10:44 PM  ? Specimen: Nasopharyngeal(NP) swabs in vial transport medium  ?Result Value Ref Range Status  ? SARS Coronavirus 2 by RT PCR NEGATIVE NEGATIVE Final  ?  Comment: (NOTE) ?SARS-CoV-2 target nucleic acids are NOT DETECTED. ? ?The SARS-CoV-2 RNA is generally detectable in upper respiratory ?specimens during the acute phase of infection. The lowest ?concentration of SARS-CoV-2 viral copies this assay can detect is ?138 copies/mL. A negative  result does not preclude SARS-Cov-2 ?infection and should not be used as the sole basis for treatment or ?other patient management decisions. A negative result may occur with  ?improper specimen collect

## 2021-11-08 NOTE — Plan of Care (Signed)
?  Problem: Education: ?Goal: Knowledge of General Education information will improve ?Description: Including pain rating scale, medication(s)/side effects and non-pharmacologic comfort measures ?Outcome: Progressing ?  ?Problem: Clinical Measurements: ?Goal: Ability to maintain clinical measurements within normal limits will improve ?Outcome: Progressing ?Goal: Will remain free from infection ?Outcome: Progressing ?Goal: Diagnostic test results will improve ?Outcome: Progressing ?Goal: Respiratory complications will improve ?Outcome: Progressing ?Goal: Cardiovascular complication will be avoided ?Outcome: Progressing ?  ?Problem: Activity: ?Goal: Risk for activity intolerance will decrease ?Outcome: Progressing ?  ?Problem: Nutrition: ?Goal: Adequate nutrition will be maintained ?Outcome: Progressing ?  ?Problem: Elimination: ?Goal: Will not experience complications related to bowel motility ?Outcome: Progressing ?Goal: Will not experience complications related to urinary retention ?Outcome: Progressing ?  ?Problem: Safety: ?Goal: Ability to remain free from injury will improve ?Outcome: Progressing ?  ?Problem: Skin Integrity: ?Goal: Risk for impaired skin integrity will decrease ?Outcome: Progressing ?  ?

## 2021-11-09 ENCOUNTER — Ambulatory Visit: Payer: Self-pay | Admitting: Licensed Clinical Social Worker

## 2021-11-09 ENCOUNTER — Telehealth: Payer: Medicare Other

## 2021-11-09 DIAGNOSIS — F419 Anxiety disorder, unspecified: Secondary | ICD-10-CM

## 2021-11-09 DIAGNOSIS — J4540 Moderate persistent asthma, uncomplicated: Secondary | ICD-10-CM

## 2021-11-09 LAB — POTASSIUM: Potassium: 3.8 mmol/L (ref 3.5–5.1)

## 2021-11-09 MED ORDER — POTASSIUM CHLORIDE CRYS ER 20 MEQ PO TBCR
40.0000 meq | EXTENDED_RELEASE_TABLET | Freq: Once | ORAL | Status: AC
  Administered 2021-11-09: 40 meq via ORAL
  Filled 2021-11-09: qty 2

## 2021-11-09 MED ORDER — BISACODYL 10 MG RE SUPP
10.0000 mg | Freq: Once | RECTAL | Status: AC
  Administered 2021-11-09: 10 mg via RECTAL
  Filled 2021-11-09: qty 1

## 2021-11-09 MED ORDER — POLYVINYL ALCOHOL 1.4 % OP SOLN
2.0000 [drp] | Freq: Two times a day (BID) | OPHTHALMIC | Status: DC
  Administered 2021-11-09 – 2021-11-10 (×2): 2 [drp] via OPHTHALMIC
  Filled 2021-11-09: qty 15

## 2021-11-09 MED ORDER — POTASSIUM CHLORIDE 10 MEQ/100ML IV SOLN
10.0000 meq | INTRAVENOUS | Status: AC
  Administered 2021-11-09 (×4): 10 meq via INTRAVENOUS
  Filled 2021-11-09 (×3): qty 100

## 2021-11-09 MED ORDER — POTASSIUM CHLORIDE 10 MEQ/100ML IV SOLN
INTRAVENOUS | Status: AC
  Filled 2021-11-09: qty 100

## 2021-11-09 MED ORDER — SORBITOL 70 % SOLN
200.0000 mL | TOPICAL_OIL | Freq: Once | ORAL | Status: DC
  Filled 2021-11-09: qty 60

## 2021-11-09 NOTE — Care Management Important Message (Signed)
Important Message ? ?Patient Details  ?Name: Tina Riley ?MRN: [id number] ?Date of Birth: [birth year] ? ? ?Medicare Important Message Given:  Yes ? ? ? ? ?Aleyda Gindlesperger ?11/09/2021, 4:22 PM ?

## 2021-11-09 NOTE — [place] (Signed)
?[place] MEDICAID FL2 LEVEL OF CARE SCREENING TOOL  ?  ? ?IDENTIFICATION  ?Patient Name: ?Tina Riley Birthdate: 03-04-1932 Sex: female Admission Date (Current Location): ?11/03/2021  ?[place] Number: ? [place] ?  Facility and Address:  ?[facility], [address] [place], [place] [address] ?     Provider Number: ?[id number]  ?Attending Physician Name and Address:  ?Alwyn Ren, MD ? Relative Name and Phone Number:  ?Camp,Mitchell Aurther Loft)   [phone] ?   ?Current Level of Care: ?Hospital Recommended Level of Care: ?Skilled Nursing Facility Prior Approval Number: ?  ? ?Date Approved/Denied: ?  PASRR Number: ? ([id number]) ? ?Discharge Plan: ?SNF ?  ? ?Current Diagnoses: ?Patient Active Problem List  ? Diagnosis Date Noted  ? CAP (community acquired pneumonia) 11/04/2021  ? Sepsis secondary to UTI (HCC) 11/04/2021  ? Fall at home, initial encounter 11/04/2021  ? Abdominal pain 11/04/2021  ? Constipation 11/04/2021  ? Renal insufficiency 11/04/2021  ? Pulmonary nodule 11/04/2021  ? Dysphagia 09/18/2021  ? Hypokalemia 08/14/2021  ? Cognitive change 08/14/2021  ? Hyponatremia 08/12/2021  ? Abnormal urine 08/11/2021  ? SBO (small bowel obstruction) (HCC) 08/10/2021  ? Anxiety 02/24/2021  ? GERD (gastroesophageal reflux disease) 02/24/2021  ? Hair thinning 12/25/2020  ? HSV (herpes simplex virus) infection 10/17/2020  ? Allergic rhinitis 10/17/2020  ? Hyperlipidemia 10/17/2020  ? Diarrhea 10/17/2020  ? Asthma 10/17/2020  ? Insomnia 10/17/2020  ? Poor appetite 10/17/2020  ? Tremor 10/17/2020  ? ? ?Orientation RESPIRATION BLADDER Height & Weight   ?  ?Time, Situation, Place, Self ? Normal Incontinent Weight: 97 lb 3.6 oz (44.1 kg) ?Height:  4\' 11"  (149.9 cm)  ?BEHAVIORAL SYMPTOMS/MOOD NEUROLOGICAL BOWEL NUTRITION STATUS  ?    Incontinent Diet (See d/c summary)  ?AMBULATORY STATUS COMMUNICATION OF NEEDS Skin   ?Extensive Assist Verbally Normal ?  ?  ?  ?    ?      ?     ? ? ?Personal Care Assistance Level of Assistance  ?Bathing, Feeding, Dressing Bathing Assistance: Maximum assistance ?Feeding assistance: Limited assistance ?Dressing Assistance: Maximum assistance ?   ? ?Functional Limitations Info  ?Hearing, Sight, Speech Sight Info: Impaired ?Hearing Info: Adequate ?Speech Info: Adequate  ? ? ?SPECIAL CARE FACTORS FREQUENCY  ?PT (By licensed PT), OT (By licensed OT)   ?  ?PT Frequency: 5x/ week ?OT Frequency: 5x/ week ?  ?  ?  ?   ? ? ?Contractures Contractures Info: Not present  ? ? ?Additional Factors Info  ?Code Status, Allergies Code Status Info: DNR ?Allergies Info: Cephalexin   Ciprofloxacin   Other   Meperidine Hcl   Sulfa Antibiotics   Cefdinir   Doxycycline ?  ?  ?  ?   ? ?Current Medications (11/09/2021):  This is the current hospital active medication list ?Current Facility-Administered Medications  ?Medication Dose Route Frequency Provider Last Rate Last Admin  ? acetaminophen (TYLENOL) 160 MG/5ML solution 650 mg  650 mg Oral Q6H PRN 11/11/2021, MD   650 mg at 11/09/21 0902  ? albuterol (PROVENTIL) (2.5 MG/3ML) 0.083% nebulizer solution 2.5 mg  2.5 mg Nebulization Q6H PRN 11/11/21 A, MD      ? arformoterol (BROVANA) nebulizer solution 15 mcg  15 mcg Nebulization BID Madelyn Flavors A, MD   15 mcg at 11/09/21 11/11/21  ? bisacodyl (DULCOLAX) suppository 10 mg  10 mg Rectal Once 7672, MD      ?  budesonide (PULMICORT) nebulizer solution 0.5 mg  0.5 mg Nebulization BID Madelyn Flavors A, MD   0.5 mg at 11/09/21 2505  ? cefTRIAXone (ROCEPHIN) 1 g in sodium chloride 0.9 % 100 mL IVPB  1 g Intravenous Q24H Alwyn Ren, MD 200 mL/hr at 11/08/21 1608 1 g at 11/08/21 1608  ? clonazePAM (KLONOPIN) tablet 0.5 mg  0.5 mg Oral BID Alwyn Ren, MD   0.5 mg at 11/09/21 1059  ? enoxaparin (LOVENOX) injection 30 mg  30 mg Subcutaneous Q24H Katrinka Blazing, Rondell A, MD   30 mg at 11/08/21 1608  ? feeding supplement (BOOST / RESOURCE BREEZE)  liquid 1 Container  237 mL Oral Daily Clydie Braun, MD   1 Container at 11/09/21 1102  ? gabapentin (NEURONTIN) capsule 200 mg  200 mg Oral BID Alwyn Ren, MD   200 mg at 11/09/21 1058  ? loratadine (CLARITIN) tablet 10 mg  10 mg Oral Daily Madelyn Flavors A, MD   10 mg at 11/09/21 1059  ? ondansetron (ZOFRAN) tablet 4 mg  4 mg Oral Q6H PRN Madelyn Flavors A, MD   4 mg at 11/08/21 1146  ? Or  ? ondansetron (ZOFRAN) injection 4 mg  4 mg Intravenous Q6H PRN Madelyn Flavors A, MD      ? pantoprazole (PROTONIX) EC tablet 40 mg  40 mg Oral Daily Katrinka Blazing, Rondell A, MD   40 mg at 11/09/21 1058  ? polyvinyl alcohol (LIQUIFILM TEARS) 1.4 % ophthalmic solution 1 drop  1 drop Both Eyes PRN Smith, Rondell A, MD      ? polyvinyl alcohol (LIQUIFILM TEARS) 1.4 % ophthalmic solution 2 drop  2 drop Both Eyes BID Alwyn Ren, MD      ? potassium chloride 10 mEq in 100 mL IVPB  10 mEq Intravenous Q1 Hr x 5 Alwyn Ren, MD 100 mL/hr at 11/09/21 1217 10 mEq at 11/09/21 1217  ? propranolol ER (INDERAL LA) 24 hr capsule 60 mg  60 mg Oral Daily Katrinka Blazing, Rondell A, MD   60 mg at 11/09/21 1059  ? psyllium (HYDROCIL/METAMUCIL) 1 packet  1 packet Oral Daily Alwyn Ren, MD   1 packet at 11/08/21 1143  ? rosuvastatin (CRESTOR) tablet 5 mg  5 mg Oral QHS Smith, Rondell A, MD   5 mg at 11/08/21 2337  ? senna-docusate (Senokot-S) tablet 1 tablet  1 tablet Oral BID Clydie Braun, MD   1 tablet at 11/08/21 2338  ? sodium chloride flush (NS) 0.9 % injection 3 mL  3 mL Intravenous Q12H Smith, Rondell A, MD   3 mL at 11/09/21 1115  ? valACYclovir (VALTREX) tablet 1,000 mg  1,000 mg Oral Daily Madelyn Flavors A, MD   1,000 mg at 11/09/21 1059  ? ? ? ?Discharge Medications: ?Please see discharge summary for a list of discharge medications. ? ?Relevant Imaging Results: ? ?Relevant Lab Results: ? ? ?Additional Information ?SSN:  [id number]; Pfizer COVID-19 Vaccine 06/11/2020 , 10/04/2019 , 09/13/2019  Pfizer Covid-19  Vaccine Bivalent Booster 07/08/2021 ? ?Catalina Pizza Carmina Walle, LCSWA ? ? ? ? ?

## 2021-11-09 NOTE — Consult Note (Signed)
? ?  [facility] CM Inpatient Consult ? ? ?11/09/2021 ? ?Sarissa Dern Ulysse ?01-Dec-1931 ?[id number] ? ?[facility] Customer service manager [THN]  Accountable Care Organization [ACO] Patient: ? ?Primary Care Provider:  Myrlene Broker, MD, [place] [facility], is an embedded provider with a Chronic Care Management [CCM] team and program, and is listed for the transition of care follow up and appointments.  ? ?Patient was screened for Embedded practice service needs for chronic care management and patient is active with the Embedded Child psychotherapist for CCM ? ?Plan:  [facility] LCSW at Embedded Care Management is aware of admission and the post hospital transition/disposition. ? ?Please contact for further questions, ? ?Charlesetta Shanks, RN BSN CCM ?[facility] [employer] Liaison ? [phone] business mobile phone ?Toll free office [phone]  ?Fax number: [phone] ?[email or web address] ? ? ? ?

## 2021-11-09 NOTE — TOC Progression Note (Signed)
Transition of Care (TOC) - Initial/Assessment Note  ? ? ?Patient Details  ?Name: Tina Riley ?MRN: [id number] ?Date of Birth: 06-03-1932 ? ?Transition of Care (TOC) CM/SW Contact:    ?Paulene Floor [place] Janus, LCSWA ?Phone Number: ?11/09/2021, 2:24 PM ? ?Clinical Narrative:                 ?CSW met with patient at bedside.  Patient is now agreeable to SNF placement.  CSW explained the insurance authorization process and gave the patient the Medicare.com list to review.  The patient did not express a facility choice. ? ?FL2 faxed out to SNFs.   ? ?Pending: bed offers and insurance authorization. ? ?Expected Discharge Plan: [place] ?Barriers to Discharge: Ship broker, Continued Medical Work up, SNF Pending bed offer, Financial Resources ? ? ?Patient Goals and CMS Choice ?  ?CMS Medicare.gov Compare Post Acute Care list provided to:: Patient ?Choice offered to / list presented to : Patient ? ?Expected Discharge Plan and Services ?Expected Discharge Plan: Tina Riley ?  ?  ?  ?Living arrangements for the past 2 months: Apartment ?                ?  ?  ?  ?  ?  ?  ?  ?  ?  ?  ? ?Prior Living Arrangements/Services ?Living arrangements for the past 2 months: Apartment ?Lives with:: Self ?Patient language and need for interpreter reviewed:: Yes ?Do you feel safe going back to the place where you live?: Yes      ?Need for Family Participation in Patient Care: Yes (Comment) ?Care giver support system in place?: Yes (comment) ?  ?Criminal Activity/Legal Involvement Pertinent to Current Situation/Hospitalization: No - Comment as needed ? ?Activities of Daily Living ?Home Assistive Devices/Equipment: None ?ADL Screening (condition at time of admission) ?Patient's cognitive ability adequate to safely complete daily activities?: No ?Is the patient deaf or have difficulty hearing?: No ?Does the patient have difficulty seeing, even when wearing glasses/contacts?: No ?Does the patient have  difficulty concentrating, remembering, or making decisions?: Yes ?Patient able to express need for assistance with ADLs?: Yes ?Does the patient have difficulty dressing or bathing?: No ?Independently performs ADLs?: Yes (appropriate for developmental age) ?Walks in Home: Independent ?Does the patient have difficulty walking or climbing stairs?: No ?Weakness of Legs: Both ?Weakness of Arms/Hands: Both ? ?Permission Sought/Granted ?  ?  ?   ?   ?   ?   ? ?Emotional Assessment ?Appearance:: Appears older than stated age ?Attitude/Demeanor/Rapport: Engaged, Crying, Gracious ?Affect (typically observed): Appropriate, Depressed, Anxious, Pleasant, Tearful/Crying ?Orientation: : Oriented to Place, Oriented to Self, Oriented to Situation ?Alcohol / Substance Use: Not Applicable ?Psych Involvement: No (comment) ? ?Admission diagnosis:  Lower urinary tract infectious disease [N39.0] ?CAP (community acquired pneumonia) [J18.9] ?Patient Active Problem List  ? Diagnosis Date Noted  ? CAP (community acquired pneumonia) 11/04/2021  ? Sepsis secondary to UTI ([place]) 11/04/2021  ? Fall at home, initial encounter 11/04/2021  ? Abdominal pain 11/04/2021  ? Constipation 11/04/2021  ? Renal insufficiency 11/04/2021  ? Pulmonary nodule 11/04/2021  ? Dysphagia 09/18/2021  ? Hypokalemia 08/14/2021  ? Cognitive change 08/14/2021  ? Hyponatremia 08/12/2021  ? Abnormal urine 08/11/2021  ? SBO (small bowel obstruction) ([place]) 08/10/2021  ? Anxiety 02/24/2021  ? GERD (gastroesophageal reflux disease) 02/24/2021  ? Hair thinning 12/25/2020  ? HSV (herpes simplex virus) infection 10/17/2020  ? Allergic rhinitis 10/17/2020  ? Hyperlipidemia 10/17/2020  ? Diarrhea 10/17/2020  ?  Asthma 10/17/2020  ? Insomnia 10/17/2020  ? Poor appetite 10/17/2020  ? Tremor 10/17/2020  ? ?PCP:  Hoyt Koch, MD ?Pharmacy:   ?[facility] ?[address] ?[place] [address] ?Phone: [phone] Fax: [phone] ? ? ? ? ?Social  Determinants of Health (SDOH) Interventions ?  ? ?Readmission Risk Interventions ?   ? View : No data to display.  ?  ?  ?  ? ? ? ?

## 2021-11-09 NOTE — Progress Notes (Signed)
Physical Therapy Treatment ?Patient Details ?Name: Tina Riley ?MRN: [id number] ?DOB: September 20, 1931 ?Today's Date: 11/09/2021 ? ? ?History of Present Illness 88 y/o female presented to ED on 11/03/21 after being found down and AMS. Admitted for sepsis 2/2 UTI. CT concerned for R sided upper and lobe PNA. PMH: HTN, asthma, IBS ? ?  ?PT Comments  ? ? Continuing work on functional mobility and activity tolerance;  Pt with "giddy-headedness" upon sitting up , so opted to get Orthostatic BPs, which overall checked out normal (See vitals flow sheet.); Needed mod assist to come to sand and mod assist to take pivotal steps with RW to recliner; Noting posterior lean, especially with incr time in standing; continue to recommend SNF for post-acute rehab to maximize independence and safety with mobility and ADLs   ?Recommendations for follow up therapy are one component of a multi-disciplinary discharge planning process, led by the attending physician.  Recommendations may be updated based on patient status, additional functional criteria and insurance authorization. ? ?Follow Up Recommendations ? Skilled nursing-short term rehab (<3 hours/day) ?  ?  ?Assistance Recommended at Discharge Frequent or constant Supervision/Assistance  ?Patient can return home with the following A little help with walking and/or transfers;A little help with bathing/dressing/bathroom;Assistance with cooking/housework;Assist for transportation ?  ?Equipment Recommendations ? None recommended by PT  ?  ?Recommendations for Other Services   ? ? ?  ?Precautions / Restrictions Precautions ?Precautions: Fall ?Precaution Comments: REported "giddy-headed" upon getting up to sitting EOB; Orthostatics checked out without a big drop  ?  ? ?Mobility ? Bed Mobility ?Overal bed mobility: Needs Assistance ?Bed Mobility: Supine to Sit ?  ?  ?Supine to sit: Min assist, HOB elevated ?  ?  ?General bed mobility comments: Min assist to pull to sit, and to square off  hips at EOB ?  ? ?Transfers ?Overall transfer level: Needs assistance ?Equipment used: Rolling walker (2 wheels) ?Transfers: Sit to/from Stand, Bed to chair/wheelchair/BSC ?Sit to Stand: Mod assist ?  ?Step pivot transfers: Mod assist ?  ?  ?  ?General transfer comment: Light mod assist to power up to stand; cues for hand placement; Heavier mod assist for pivotal steps bed to recliner sor suport due to heavy posterior lean ?  ? ?Ambulation/Gait ?  ?  ?  ?  ?  ?  ?  ?General Gait Details: Deferred due to heavy posterior lean in standing ? ? ?Stairs ?  ?  ?  ?  ?  ? ? ?Wheelchair Mobility ?  ? ?Modified Rankin (Stroke Patients Only) ?  ? ? ?  ?Balance   ?  ?Sitting balance-Leahy Scale: Fair ?  ?Postural control: Posterior lean ?Standing balance support: Reliant on assistive device for balance ?Standing balance-Leahy Scale: Poor ?Standing balance comment: reliant on RW ?  ?  ?  ?  ?  ?  ?  ?  ?  ?  ?  ?  ? ?  ?Cognition Arousal/Alertness: Awake/alert ?Behavior During Therapy: [facility] for tasks assessed/performed ?Overall Cognitive Status: Within Functional Limits for tasks assessed ?  ?  ?  ?  ?  ?  ?  ?  ?  ?  ?  ?  ?  ?  ?  ?  ?General Comments: seems WFL for tasks assessed. Significant fear of falling ?  ?  ? ?  ?Exercises   ? ?  ?General Comments General comments (skin integrity, edema, etc.): Orthostatics checked out without a huge BP drop from supine to  standing at 3 minutes; See vitals flow sheet. ? ?  ?  ? ?Pertinent Vitals/Pain Pain Assessment ?Pain Assessment: No/denies pain ?Pain Intervention(s): Monitored during session  ? ? ?Home Living   ?  ?  ?  ?  ?  ?  ?  ?  ?  ?   ?  ?Prior Function    ?  ?  ?   ? ?PT Goals (current goals can now be found in the care plan section) Acute Rehab PT Goals ?Patient Stated Goal: to get stronger and be independent ?PT Goal Formulation: With patient ?Time For Goal Achievement: 11/20/21 ?Potential to Achieve Goals: Good ?Progress towards PT goals: Progressing toward goals  (slowly) ? ?  ?Frequency ? ? ? Min 2X/week ? ? ? ?  ?PT Plan Current plan remains appropriate;Frequency needs to be updated  ? ? ?Co-evaluation   ?  ?  ?  ?  ? ?  ?AM-PAC PT "6 Clicks" Mobility   ?Outcome Measure ? Help needed turning from your back to your side while in a flat bed without using bedrails?: A Lot ?Help needed moving from lying on your back to sitting on the side of a flat bed without using bedrails?: A Little ?Help needed moving to and from a bed to a chair (including a wheelchair)?: A Lot ?Help needed standing up from a chair using your arms (e.g., wheelchair or bedside chair)?: A Lot ?Help needed to walk in hospital room?: A Lot ?Help needed climbing 3-5 steps with a railing? : A Lot ?6 Click Score: 13 ? ?  ?End of Session Equipment Utilized During Treatment: Gait belt ?Activity Tolerance: Patient tolerated treatment well ?Patient left: in chair;with call bell/phone within reach;with chair alarm set ?Nurse Communication: Mobility status ?PT Visit Diagnosis: Unsteadiness on feet (R26.81);History of falling (Z91.81);Muscle weakness (generalized) (M62.81);Other abnormalities of gait and mobility (R26.89) ?  ? ? ?Time: 8341-9622 ?PT Time Calculation (min) (ACUTE ONLY): 27 min ? ?Charges:  $Therapeutic Activity: 23-37 mins          ?          ? ?Van Clines, PT  ?Acute Rehabilitation Services ?Office [phone] ? ? ? ?Levi Aland ?11/09/2021, 11:14 AM ? ?

## 2021-11-09 NOTE — Progress Notes (Signed)
?PROGRESS NOTE ? ? ? ?Tina Riley  [id number] DOB: 05/07/1932 DOA: 11/03/2021 ?PCP: Hoyt Koch, MD  ? ?Brief Narrative: 88 year old female lives alone at home admitted status post fall.  She has a history of hypertension IBS depression asthma small bowel obstruction and GERD.  Patient also had complaints of dysphagia feeling of food getting stuck in her throat.  She has history of esophageal stretching and done in the past.  She was due to follow-up with GI Dr. Lorenso Courier as an outpatient.  Her p.o. intake has been poor due to nausea and food getting stuck in her throat. ? ?Assessment & Plan: ?  ?Principal Problem: ?  CAP (community acquired pneumonia) ?Active Problems: ?  Sepsis secondary to UTI [facility]) ?  Asthma ?  GERD (gastroesophageal reflux disease) ?  Fall at home, initial encounter ?  Abdominal pain ?  Constipation ?  Renal insufficiency ?  Pulmonary nodule ? ? ?#1 sepsis secondary to urinary tract infection patient was admitted after a fall at home.  She was tachypneic and tachycardic with lactic acidosis and leukocytosis.  Prime preliminary UA was consistent with UTI.  Urine culture grew Citrobacter pansensitive.  She had Citrobacter in January which was untreated. ?Lactic acidosis has resolved ?Leukocytosis improving ?Procalcitonin 0.13 ?Continue azithromycin ?Continue antibiotics for 5 days and then DC. ?Start Rocephin DC cefepime ? ?#2 community-acquired pneumonia versus aspiration CT chest shows right-sided right upper and lower lobe opacities.  Speech consulted.  ?Patient with history of esophageal disorders and esophageal stretching in the past. ?Continue antibiotics as above. ?Appreciate speech therapy evaluation. 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 ?#3 status post fall at home prior to admission PT OT consult no evidence of acute fractures by CT or x-rays. ?Await SNF ? ?#4 constipation restart Metamucil ?Dulcolax  suppository ? ?#5  Renal insufficiency resolved with IV fluids ? ?#6 essential hypertension continue propranolol ? ?#7 history of hyperlipidemia on Crestor ? ?#8 history of asthma stable continue nebs ? ?#9 right lower lobe lung nodule 13 mm recommend repeat CT in 3 months ? ?#10 GERD on Protonix ? ?Estimated body mass index is 19.64 kg/m? as calculated from the following: ?  Height as of this encounter: 4\' 11"  (1.499 m). ?  Weight as of this encounter: 44.1 kg. ? ?DVT prophylaxis: Lovenox  ?code Status: DNR ?Family Communication: None at bedside ?Disposition Plan:  Status is: Inpatient ?Remains inpatient appropriate because: Status post fall with pneumonia and UTI ?  ?Consultants:  ?None ? ?Procedures: (None ?Antimicrobials: Rocephin azithromycin ?Subjective: ?Complaining of constipation  ready to take an enema or suppository if needed. ? ?Objective: ?Vitals:  ? 11/09/21 [id number] 11/09/21 [id number] 11/09/21 0835 11/09/21 0956  ?BP: (!) 177/78   (!) 167/75  ?Pulse: 77 75  73  ?Resp: 14 16  17   ?Temp: 98 ?F (36.7 ?C)   97.9 ?F (36.6 ?C)  ?TempSrc:    Oral  ?SpO2: 96% 96% 96% 98%  ?Weight:      ?Height:      ? ? ?Intake/Output Summary (Last 24 hours) at 11/09/2021 1259 ?Last data filed at 11/08/2021 1700 ?Gross per 24 hour  ?Intake 500 ml  ?Output 600 ml  ?Net -100 ml  ? ? ?Filed Weights  ? 11/04/21 1817  ?Weight: 44.1 kg  ? ? ?Examination: ?Bruise on the right forehead ?General exam: Appears frail elderly chronically ill looking female ?Respiratory system: Clear to auscultation. Respiratory effort normal. ?Cardiovascular system: S1 & S2  heard, RRR. No JVD, murmurs, rubs, gallops or clicks. No pedal edema. ?Gastrointestinal system: Abdomen is nondistended, soft and nontender. No organomegaly or masses felt. Normal bowel sounds heard. ?Central nervous system: Awake alert oriented  ?extremities: Symmetric 5 x 5 power. ?Skin: No rashes, lesions or ulcers ?Psychiatry: unable to assess ? ?Data Reviewed: I have personally reviewed  following labs and imaging studies ? ?CBC: ?Recent Labs  ?Lab 11/02/21 ?1601 11/03/21 ?2244 11/04/21 ?1335 11/05/21 ?1021 11/08/21 ?0731  ?WBC 7.6 17.4* 16.0* 10.9* 6.6  ?NEUTROABS  --  13.5*  --   --   --   ?HGB 13.1 15.4* 13.5 10.5* 12.2  ?HCT 39.5 47.0* 41.0 31.7* 36.2  ?MCV 106.5* 104.9* 108.8* 105.7* 103.1*  ?PLT 277.0 333 222 170 228  ? ? ?Basic Metabolic Panel: ?Recent Labs  ?Lab 11/02/21 ?1601 11/03/21 ?2244 11/04/21 ?1335 11/05/21 ?[id number] 11/08/21 ?0731  ?NA 139 138 139 134* 139  ?K 4.8 4.1 3.8 4.0 2.8*  ?CL 102 103 106 102 104  ?CO2 29 19* 21* 18* 26  ?GLUCOSE 88 187* 128* 92 102*  ?BUN 17 13 13 15 11   ?CREATININE 0.95 1.04* 0.98 0.92 0.75  ?CALCIUM 9.7 9.1 8.4* 8.4* 8.5*  ?MG 2.2  --   --   --  1.8  ? ? ?GFR: ?Estimated Creatinine Clearance: 33.2 mL/min (by C-G formula based on SCr of 0.75 mg/dL). ?Liver Function Tests: ?Recent Labs  ?Lab 11/02/21 ?1601 11/03/21 ?2244 11/08/21 ?0731  ?AST 13 26 15   ?ALT 8 16 19   ?ALKPHOS 103 96 62  ?BILITOT 0.4 1.1 0.1*  ?PROT 7.4 7.0 5.3*  ?ALBUMIN 4.4 3.7 2.8*  ? ? ?Recent Labs  ?Lab 11/03/21 ?2244  ?LIPASE 28  ? ? ?No results for input(s): AMMONIA in the last 168 hours. ?Coagulation Profile: ?Recent Labs  ?Lab 11/03/21 ?2244  ?INR 1.1  ? ? ?Cardiac Enzymes: ?Recent Labs  ?Lab 11/03/21 ?2244 11/04/21 ?1335  ?CKTOTAL 996* 780*  ? ? ?BNP (last 3 results) ?No results for input(s): PROBNP in the last 8760 hours. ?HbA1C: ?No results for input(s): HGBA1C in the last 72 hours. ?CBG: ?No results for input(s): GLUCAP in the last 168 hours. ?Lipid Profile: ?No results for input(s): CHOL, HDL, LDLCALC, TRIG, CHOLHDL, LDLDIRECT in the last 72 hours. ?Thyroid Function Tests: ?No results for input(s): TSH, T4TOTAL, FREET4, T3FREE, THYROIDAB in the last 72 hours. ? ?Anemia Panel: ?No results for input(s): VITAMINB12, FOLATE, FERRITIN, TIBC, IRON, RETICCTPCT in the last 72 hours. ?Sepsis Labs: ?Recent Labs  ?Lab 11/04/21 ?0333 11/04/21 ?[id number] 11/04/21 ?1307 11/04/21 ?1335 11/04/21 ?1849   ?PROCALCITON  --   --   --  0.13  --   ?LATICACIDVEN 4.5* 2.9* 1.3  --  1.8  ? ? ? ?Recent Results (from the past 240 hour(s))  ?Blood Culture (routine x 2)     Status: None  ? Collection Time: 11/03/21 10:44 PM  ? Specimen: BLOOD LEFT HAND  ?Result Value Ref Range Status  ? Specimen Description BLOOD LEFT HAND  Final  ? Special Requests   Final  ?  BOTTLES DRAWN AEROBIC AND ANAEROBIC Blood Culture adequate volume  ? Culture   Final  ?  NO GROWTH 5 DAYS ?Performed at [facility], [place] [address] [place], [place] [address] ?  ? Report Status 11/08/2021 FINAL  Final  ?Urine Culture     Status: Abnormal  ? Collection Time: 11/03/21 10:44 PM  ? Specimen: In/Out Cath Urine  ?Result Value Ref Range Status  ?  Specimen Description IN/OUT CATH URINE  Final  ? Special Requests   Final  ?  NONE ?Performed at [facility], [place] [address], [place], [place] [address] ?  ? Culture >=100,000 COLONIES/mL CITROBACTER KOSERI (A)  Final  ? Report Status 11/06/2021 FINAL  Final  ? Organism ID, Bacteria CITROBACTER KOSERI (A)  Final  ?    Susceptibility  ? Citrobacter koseri - MIC*  ?  CEFAZOLIN <=4 SENSITIVE Sensitive   ?  CEFEPIME <=0.12 SENSITIVE Sensitive   ?  CEFTRIAXONE <=0.25 SENSITIVE Sensitive   ?  CIPROFLOXACIN <=0.25 SENSITIVE Sensitive   ?  GENTAMICIN <=1 SENSITIVE Sensitive   ?  IMIPENEM <=0.25 SENSITIVE Sensitive   ?  NITROFURANTOIN <=16 SENSITIVE Sensitive   ?  TRIMETH/SULFA <=20 SENSITIVE Sensitive   ?  PIP/TAZO <=4 SENSITIVE Sensitive   ?  * >=100,000 COLONIES/mL CITROBACTER KOSERI  ?Resp Panel by RT-PCR (Flu A&B, Covid)     Status: None  ? Collection Time: 11/03/21 10:44 PM  ? Specimen: Nasopharyngeal(NP) swabs in vial transport medium  ?Result Value Ref Range Status  ? SARS Coronavirus 2 by RT PCR NEGATIVE NEGATIVE Final  ?  Comment: (NOTE) ?SARS-CoV-2 target nucleic acids are NOT DETECTED. ? ?The SARS-CoV-2 RNA is generally detectable in upper respiratory ?specimens during the acute phase of infection.  The lowest ?concentration of SARS-CoV-2 viral copies this assay can detect is ?138 copies/mL. A negative result does not preclude SARS-Cov-2 ?infection and should not be used as the sole basis for treatment or ?other pati

## 2021-11-09 NOTE — Patient Instructions (Addendum)
? ? ? ?  The Care Guide will contact you to reschedule the phone appointment   ?Patient was not contacted during this encounter.  LCSW collaborated with care team to accomplish patient's care plan goal  ? ?Sahaana Weitman, LCSW ?Licensed Clinical Social Worker /Care Management  ?[facility]  ?[phone]  ?

## 2021-11-09 NOTE — Chronic Care Management (AMB) (Signed)
? ? ? ?  Care Management  ? Note ? ?11/09/2021 ?Name: Demara Lover MRN: [id number] DOB: [birth year] ? ?Tina Riley is a 88 y.o. year old female who is a primary care patient of Myrlene Broker, MD.  ? ?Assessment: Patient was not interviewed or contacted during this encounter. She  had CCM phone appointment today with LCSW but is in the hospital with anticipation of discharge to SNF.  Will cancel appointment for today ? ?LCSW was previously working with patient and nephew for therapy options at [facility].  Per referral they have reached out April 4th and 11th with no return paperwork. ?  ?Intervention:Conducted brief assessment.  Review of patient past medical history, allergies, medications, and health status, including review of pertinent consultant reports was performed as part of comprehensive evaluation and provision of care management/care coordination services.  ?  ?Follow up Plan: Will route chart to Care Guide to reschedule phone appointment  after discharge ?  ?Sammuel Hines, LCSW ?Licensed Clinical Social Worker Lavinia Sharps Management  ?[facility]  ?[phone]  ?

## 2021-11-10 ENCOUNTER — Other Ambulatory Visit (HOSPITAL_COMMUNITY): Payer: Self-pay

## 2021-11-10 DIAGNOSIS — N1831 Chronic kidney disease, stage 3a: Secondary | ICD-10-CM | POA: Diagnosis not present

## 2021-11-10 DIAGNOSIS — Z87440 Personal history of urinary (tract) infections: Secondary | ICD-10-CM | POA: Diagnosis not present

## 2021-11-10 DIAGNOSIS — K5900 Constipation, unspecified: Secondary | ICD-10-CM | POA: Diagnosis not present

## 2021-11-10 DIAGNOSIS — I10 Essential (primary) hypertension: Secondary | ICD-10-CM | POA: Diagnosis not present

## 2021-11-10 DIAGNOSIS — G4700 Insomnia, unspecified: Secondary | ICD-10-CM | POA: Diagnosis not present

## 2021-11-10 DIAGNOSIS — K219 Gastro-esophageal reflux disease without esophagitis: Secondary | ICD-10-CM | POA: Diagnosis not present

## 2021-11-10 DIAGNOSIS — R109 Unspecified abdominal pain: Secondary | ICD-10-CM | POA: Diagnosis not present

## 2021-11-10 DIAGNOSIS — R531 Weakness: Secondary | ICD-10-CM | POA: Diagnosis not present

## 2021-11-10 DIAGNOSIS — R2689 Other abnormalities of gait and mobility: Secondary | ICD-10-CM | POA: Diagnosis not present

## 2021-11-10 DIAGNOSIS — D649 Anemia, unspecified: Secondary | ICD-10-CM | POA: Diagnosis not present

## 2021-11-10 DIAGNOSIS — M6281 Muscle weakness (generalized): Secondary | ICD-10-CM | POA: Diagnosis not present

## 2021-11-10 DIAGNOSIS — E860 Dehydration: Secondary | ICD-10-CM | POA: Diagnosis not present

## 2021-11-10 DIAGNOSIS — J309 Allergic rhinitis, unspecified: Secondary | ICD-10-CM | POA: Diagnosis not present

## 2021-11-10 DIAGNOSIS — J45909 Unspecified asthma, uncomplicated: Secondary | ICD-10-CM | POA: Diagnosis not present

## 2021-11-10 DIAGNOSIS — R262 Difficulty in walking, not elsewhere classified: Secondary | ICD-10-CM | POA: Diagnosis not present

## 2021-11-10 DIAGNOSIS — N182 Chronic kidney disease, stage 2 (mild): Secondary | ICD-10-CM | POA: Diagnosis not present

## 2021-11-10 DIAGNOSIS — N390 Urinary tract infection, site not specified: Secondary | ICD-10-CM | POA: Diagnosis not present

## 2021-11-10 DIAGNOSIS — J189 Pneumonia, unspecified organism: Secondary | ICD-10-CM | POA: Diagnosis not present

## 2021-11-10 DIAGNOSIS — E876 Hypokalemia: Secondary | ICD-10-CM | POA: Diagnosis not present

## 2021-11-10 DIAGNOSIS — E785 Hyperlipidemia, unspecified: Secondary | ICD-10-CM | POA: Diagnosis not present

## 2021-11-10 DIAGNOSIS — Z743 Need for continuous supervision: Secondary | ICD-10-CM | POA: Diagnosis not present

## 2021-11-10 DIAGNOSIS — R404 Transient alteration of awareness: Secondary | ICD-10-CM | POA: Diagnosis not present

## 2021-11-10 DIAGNOSIS — R1310 Dysphagia, unspecified: Secondary | ICD-10-CM | POA: Diagnosis not present

## 2021-11-10 DIAGNOSIS — R339 Retention of urine, unspecified: Secondary | ICD-10-CM | POA: Diagnosis not present

## 2021-11-10 DIAGNOSIS — Z7689 Persons encountering health services in other specified circumstances: Secondary | ICD-10-CM | POA: Diagnosis not present

## 2021-11-10 MED ORDER — GABAPENTIN 100 MG PO CAPS: 200.0000 mg | ORAL_CAPSULE | Freq: Two times a day (BID) | ORAL | Status: DC

## 2021-11-10 MED ORDER — SENNOSIDES-DOCUSATE SODIUM 8.6-50 MG PO TABS: 1.0000 | ORAL_TABLET | Freq: Two times a day (BID) | ORAL | Status: DC

## 2021-11-10 MED ORDER — CLONAZEPAM 0.5 MG PO TABS: 0.5000 mg | ORAL_TABLET | Freq: Two times a day (BID) | ORAL | 0 refills | Status: DC

## 2021-11-10 MED ORDER — MELATONIN 3 MG PO CAPS: 3.0000 mg | ORAL_CAPSULE | Freq: Every day | ORAL | 0 refills | Status: DC

## 2021-11-10 MED ORDER — PSYLLIUM 95 % PO PACK
1.0000 | PACK | Freq: Every day | ORAL | Status: DC
Start: 2021-11-11 — End: 2022-06-26

## 2021-11-10 MED ORDER — BISACODYL 5 MG PO TBEC
5.0000 mg | DELAYED_RELEASE_TABLET | Freq: Every day | ORAL | 1 refills | Status: DC | PRN
Start: 2021-11-10 — End: 2022-05-14
  Filled 2021-11-10: qty 30, 30d supply, fill #0

## 2021-11-10 MED ORDER — ONDANSETRON HCL 4 MG PO TABS
4.0000 mg | ORAL_TABLET | Freq: Four times a day (QID) | ORAL | 0 refills | Status: DC | PRN
  Filled 2021-11-10: qty 17, 5d supply, fill #0

## 2021-11-10 MED ORDER — SORBITOL 70 % SOLN
200.0000 mL | TOPICAL_OIL | Freq: Once | ORAL | Status: AC
  Administered 2021-11-10: 200 mL via RECTAL
  Filled 2021-11-10: qty 60

## 2021-11-10 NOTE — Progress Notes (Signed)
Occupational Therapy Treatment ?Patient Details ?Name: Tina Riley ?MRN: [id number] ?DOB: [birth year] ?Today's Date: 11/10/2021 ? ? ?History of present illness 88 y/o female presented to ED on 11/03/21 after being found down and AMS. Admitted for sepsis 2/2 UTI. CT concerned for R sided upper and lobe PNA. PMH: HTN, asthma, IBS ?  ?OT comments ? Patient received in supine and agreeable to OT treatment. Patient was min assist to sit on EOB and required increased time before transfer due to mild dizziness. Patient used RW and mod assist to transfer to recliner and performed grooming tasks seated. Patient performed LB dressing with changing socks seated and mod assist. Patient performed static standing with mod assist to stand and min assist for balance. Acute OT to continue to follow.   ? ?Recommendations for follow up therapy are one component of a multi-disciplinary discharge planning process, led by the attending physician.  Recommendations may be updated based on patient status, additional functional criteria and insurance authorization. ?   ?Follow Up Recommendations ? Skilled nursing-short term rehab (<3 hours/day)  ?  ?Assistance Recommended at Discharge Frequent or constant Supervision/Assistance  ?Patient can return home with the following ? A lot of help with walking and/or transfers;A lot of help with bathing/dressing/bathroom;Direct supervision/assist for medications management;Help with stairs or ramp for entrance;Assist for transportation;Assistance with cooking/housework ?  ?Equipment Recommendations ? BSC/3in1  ?  ?Recommendations for Other Services   ? ?  ?Precautions / Restrictions Precautions ?Precautions: Fall ?Restrictions ?Weight Bearing Restrictions: No  ? ? ?  ? ?Mobility Bed Mobility ?Overal bed mobility: Needs Assistance ?Bed Mobility: Supine to Sit ?  ?  ?Supine to sit: Min assist, HOB elevated ?  ?  ?General bed mobility comments: min assist to lift trunk and scoot to EOB ?   ? ?Transfers ?Overall transfer level: Needs assistance ?Equipment used: Rolling walker (2 wheels) ?Transfers: Sit to/from Stand, Bed to chair/wheelchair/BSC ?Sit to Stand: Mod assist ?  ?  ?Step pivot transfers: Mod assist ?  ?  ?General transfer comment: verbal cues for hand placement and safe walker use ?  ?  ?Balance Overall balance assessment: Needs assistance ?Sitting-balance support: No upper extremity supported, Feet supported ?Sitting balance-Leahy Scale: Fair ?  ?Postural control: Posterior lean ?Standing balance support: Reliant on assistive device for balance ?Standing balance-Leahy Scale: Poor ?Standing balance comment: reliant on RW ?  ?  ?  ?  ?  ?  ?  ?  ?  ?  ?  ?   ? ?ADL either performed or assessed with clinical judgement  ? ?ADL Overall ADL's : Needs assistance/impaired ?  ?  ?Grooming: Wash/dry hands;Wash/dry face;Brushing hair;Oral care;Supervision/safety;Sitting ?Grooming Details (indicate cue type and reason): in recliner ?  ?  ?  ?  ?  ?  ?Lower Body Dressing: Moderate assistance;Sit to/from stand ?Lower Body Dressing Details (indicate cue type and reason): Patient able to doff socks but required mod assist to donn ?Toilet Transfer: Moderate assistance;Rolling walker (2 wheels) ?Toilet Transfer Details (indicate cue type and reason): simulated to recliner ?  ?  ?  ?  ?  ?General ADL Comments: difficulty reaching feet ?  ? ?Extremity/Trunk Assessment   ?  ?  ?  ?  ?  ? ?Vision   ?  ?  ?Perception   ?  ?Praxis   ?  ? ?Cognition Arousal/Alertness: Awake/alert ?Behavior During Therapy: [facility] for tasks assessed/performed ?Overall Cognitive Status: Within Functional Limits for tasks assessed ?  ?  ?  ?  ?  ?  ?  ?  ?  ?  ?  ?  ?  ?  ?  ?  ?  General Comments: able to recall nurses names, aware of discharge plans to SNF ?  ?  ?   ?Exercises   ? ?  ?Shoulder Instructions   ? ? ?  ?General Comments    ? ? ?Pertinent Vitals/ Pain       Pain Assessment ?Pain Assessment: No/denies pain ?Pain  Intervention(s): Monitored during session ? ?Home Living   ?  ?  ?  ?  ?  ?  ?  ?  ?  ?  ?  ?  ?  ?  ?  ?  ?  ?  ? ?  ?Prior Functioning/Environment    ?  ?  ?  ?   ? ?Frequency ? Min 2X/week  ? ? ? ? ?  ?Progress Toward Goals ? ?OT Goals(current goals can now be found in the care plan section) ? Progress towards OT goals: Progressing toward goals ? ?Acute Rehab OT Goals ?Patient Stated Goal: get better ?OT Goal Formulation: With patient ?Time For Goal Achievement: 11/20/21 ?Potential to Achieve Goals: Good ?ADL Goals ?Pt Will Perform Grooming: with set-up;sitting ?Pt Will Perform Upper Body Dressing: with set-up;sitting ?Pt Will Perform Lower Body Dressing: with min assist;sit to/from stand ?Pt Will Transfer to Toilet: with min guard assist;ambulating;regular height toilet  ?Plan Discharge plan remains appropriate   ? ?Co-evaluation ? ? ?   ?  ?  ?  ?  ? ?  ?AM-PAC OT "6 Clicks" Daily Activity     ?Outcome Measure ? ? Help from another person eating meals?: None ?Help from another person taking care of personal grooming?: A Little ?Help from another person toileting, which includes using toliet, bedpan, or urinal?: A Lot ?Help from another person bathing (including washing, rinsing, drying)?: A Lot ?Help from another person to put on and taking off regular upper body clothing?: A Little ?Help from another person to put on and taking off regular lower body clothing?: A Lot ?6 Click Score: 16 ? ?  ?End of Session Equipment Utilized During Treatment: Rolling walker (2 wheels) ? ?OT Visit Diagnosis: Unsteadiness on feet (R26.81);Muscle weakness (generalized) (M62.81) ?  ?Activity Tolerance Patient tolerated treatment well ?  ?Patient Left in chair;with call bell/phone within reach;with chair alarm set ?  ?Nurse Communication Mobility status ?  ? ?   ? ?Time: 5035-4656 ?OT Time Calculation (min): 29 min ? ?Charges: OT General Charges ?$OT Visit: 1 Visit ?OT Treatments ?$Self Care/Home Management : 8-22  mins ?$Therapeutic Activity: 8-22 mins ? ?Alfonse Flavors, OTA ?Acute Rehabilitation Services  ?Pager [phone] ?Office [phone] ? ? ?Tina Riley Tina Riley ?11/10/2021, 12:15 PM ?

## 2021-11-10 NOTE — TOC Transition Note (Signed)
Transition of Care (TOC) - CM/SW Discharge Note ? ? ?Patient Details  ?Name: Tina Riley ?MRN: [id number] ?Date of Birth: Apr 16, 1932 ? ?Transition of Care (TOC) CM/SW Contact:  ?Catalina Pizza Shannah Conteh, LCSWA ?Phone Number: ?11/10/2021, 3:07 PM ? ? ?Clinical Narrative:    ?Patient will DC to:  [facility] ?Anticipated DC date:  11/10/2021 ?Family notified:  Yes ?Transport by:  Sharin Mons ? ? ?Per MD patient ready for DC to SNF. RN to call report prior to discharge to [facility] at [phone] room 603A. RN, patient, patient's family, and facility notified of DC. Discharge Summary and FL2 sent to facility. DC packet on chart. Ambulance transport requested for patient.  ? ?CSW will sign off for now as social work intervention is no longer needed. Please consult [place] again if new needs arise. ?  ? ? ?Final next level of care: Skilled Nursing Facility ?Barriers to Discharge: Barriers Resolved ? ? ?Patient Goals and CMS Choice ?  ?CMS Medicare.gov Compare Post Acute Care list provided to:: Patient ?Choice offered to / list presented to : Patient ? ?Discharge Placement ?  ?           ?Patient chooses bed at: [facility] ?Patient to be transferred to facility by: [facility] ?Name of family member notified: Camp,Mitchell Aurther Loft)   [phone] ?Patient and family notified of of transfer: 11/10/21 ? ?Discharge Plan and Services ?  ?  ?           ?  ?  ?  ?  ?  ?  ?  ?  ?  ?  ? ?Social Determinants of Health (SDOH) Interventions ?  ? ? ?Readmission Risk Interventions ?   ? View : No data to display.  ?  ?  ?  ? ? ? ? ? ?

## 2021-11-10 NOTE — Progress Notes (Signed)
Inpatient Rehab Admissions Coordinator:  ? ?Consult received and chart reviewed.  [facility] Medicare will not approve CIR for this diagnosis and PT recommending SNF.  Will sign off at this time.   ? ?Estill Dooms, PT, DPT ?Admissions Coordinator ?[phone] ?11/10/21  ?9:07 AM ? ?

## 2021-11-10 NOTE — Progress Notes (Signed)
DISCHARGE NOTE HOME ?Elberta Spaniel Napolitano to be discharged Skilled nursing facility per MD order. Discussed prescriptions and follow up appointments with the patient. Prescriptions given to patient; medication list explained in detail. Patient verbalized understanding. ? ?Skin clean, dry and intact without evidence of skin break down, no evidence of skin tears noted. IV catheter discontinued intact. Site without signs and symptoms of complications. Dressing and pressure applied. Pt denies pain at the site currently. No complaints noted. ? ?Patient free of lines, drains, and wounds.  ? ?An After Visit Summary (AVS) was printed and given to the patient. ?Patient escorted via [facility], and discharged home via [facility]. ? ?Myrtis Hopping, RN  ?
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
## 2021-11-11 DIAGNOSIS — R339 Retention of urine, unspecified: Secondary | ICD-10-CM | POA: Diagnosis not present

## 2021-11-11 DIAGNOSIS — Z87440 Personal history of urinary (tract) infections: Secondary | ICD-10-CM | POA: Diagnosis not present

## 2021-11-12 DIAGNOSIS — N390 Urinary tract infection, site not specified: Secondary | ICD-10-CM | POA: Diagnosis not present

## 2021-11-13 DIAGNOSIS — N182 Chronic kidney disease, stage 2 (mild): Secondary | ICD-10-CM | POA: Diagnosis not present

## 2021-11-13 DIAGNOSIS — Z7689 Persons encountering health services in other specified circumstances: Secondary | ICD-10-CM | POA: Diagnosis not present

## 2021-11-13 DIAGNOSIS — K5900 Constipation, unspecified: Secondary | ICD-10-CM | POA: Diagnosis not present

## 2021-11-16 DIAGNOSIS — E785 Hyperlipidemia, unspecified: Secondary | ICD-10-CM | POA: Diagnosis not present

## 2021-11-17 ENCOUNTER — Other Ambulatory Visit (HOSPITAL_COMMUNITY): Payer: Self-pay

## 2021-11-17 DIAGNOSIS — R262 Difficulty in walking, not elsewhere classified: Secondary | ICD-10-CM | POA: Diagnosis not present

## 2021-11-17 DIAGNOSIS — J45909 Unspecified asthma, uncomplicated: Secondary | ICD-10-CM | POA: Diagnosis not present

## 2021-11-17 DIAGNOSIS — I10 Essential (primary) hypertension: Secondary | ICD-10-CM | POA: Diagnosis not present

## 2021-11-17 DIAGNOSIS — R531 Weakness: Secondary | ICD-10-CM | POA: Diagnosis not present

## 2021-11-18 ENCOUNTER — Other Ambulatory Visit (HOSPITAL_COMMUNITY): Payer: Self-pay

## 2021-11-19 DIAGNOSIS — N1831 Chronic kidney disease, stage 3a: Secondary | ICD-10-CM | POA: Diagnosis not present

## 2021-11-19 DIAGNOSIS — Z7689 Persons encountering health services in other specified circumstances: Secondary | ICD-10-CM | POA: Diagnosis not present

## 2021-11-19 DIAGNOSIS — G4700 Insomnia, unspecified: Secondary | ICD-10-CM | POA: Diagnosis not present

## 2021-11-19 DIAGNOSIS — D649 Anemia, unspecified: Secondary | ICD-10-CM | POA: Diagnosis not present

## 2021-11-19 DIAGNOSIS — E860 Dehydration: Secondary | ICD-10-CM | POA: Diagnosis not present

## 2021-11-20 ENCOUNTER — Other Ambulatory Visit: Payer: Self-pay | Admitting: *Deleted

## 2021-11-20 NOTE — Patient Outreach (Signed)
Per [facility] eligible member currently resides in [facility] SNF.  Screening for potential [facility] care coordination services as a benefit of [employer] plan. ? ?Member's PCP at [place] has [facility] Embedded care coordination team. Mrs. Iott has been active with CCM services with [facility] LCSW at [facility].  ? ?Mrs. Helderman admitted to SNF on 11/10/21 after hospitalization. ? ?Secure communication sent to facility SW to [facility] aware writer is following for transition plans and [facility] care coordination needs.  ? ?Attempted to reach nephew/DPR [name] by phone [phone]. No answer. HIPAA compliant voicemail message left. Attempted to reach Mrs. Cowher [phone]. No answer.  ? ?Will continue to follow while member resides in SNF. Will attempt outreach again to nephew/member at later time.   ? ? ?Raiford Noble, MSN, RN,BSN ?[facility] Post Acute Care Coordinator ?[phone] [facility]) ?[phone]  (Toll free office)  ?

## 2021-11-23 ENCOUNTER — Other Ambulatory Visit (HOSPITAL_COMMUNITY): Payer: Self-pay

## 2021-11-23 ENCOUNTER — Other Ambulatory Visit: Payer: Self-pay | Admitting: *Deleted

## 2021-11-23 NOTE — Patient Outreach (Signed)
[facility] Post- Acute Care Coordinator follow up. Per [facility] eligible member currently resides in [facility] SNF.   ? ?Member's PCP at [employer] has [facility] Embedded care coordination team. Member was previously referred to [facility] LCSW for CCM services. ? ?Facility site visit to [facility] skilled nursing facility. Spoke with Mrs. Willig at bedside concerning transition plans. Mrs. Ambriz states her plan is to return home post SNF. However, she states her nephew Sharmon Revere is looking into possible ALF but she is not exactly sure. Discussed Building services engineer for [place] last week. Mrs. Seiple reports her nephew is pretty busy during the day with work. Will attempt again to reach [place] or his wife Jones Broom to discuss transition plans.  ? ?Mrs. Mealey endorses she lives alone. States she has applied for meals on wheels. Discussed [employer] has mobile meals for 14 days post SNF. Mrs. Gotsch is interested. Discussed Clinical research associate will make referral to [facility] Care Guide for MOMs meals thru [facility] for assistance, if Mrs. Fleisher returns home post SNF. Mrs. Rubalcava states she has a medical alert system. However, she said it was on the counter when she previously fell at home.  ? ?Will plan outreach again to [facility] nephew [name].  ? ?SNF SW in meeting during writer's visit. Therefore, unable to discuss transition plans. Secure communication sent to SNF SW to inquire.  ? ?Will make [facility] LCSW aware writer is following while member resides in SNF.  ? ? ?Raiford Noble, MSN, RN,BSN ?[facility] Post Acute Care Coordinator ?[phone] [facility]) ?[phone]  (Toll free office)  ? ? ? ? ?  ?

## 2021-11-25 ENCOUNTER — Other Ambulatory Visit: Payer: Self-pay | Admitting: *Deleted

## 2021-11-25 DIAGNOSIS — N390 Urinary tract infection, site not specified: Secondary | ICD-10-CM | POA: Diagnosis not present

## 2021-11-25 NOTE — Patient Outreach (Signed)
[facility] Post-Acute Care Coordinator follow up. [facility] eligible member screened for potential [facility] Care Management services as a benefit of member's insurance plan. ? ?Member's PCP at [facility] has [facility] Embedded care coordination services available to member if needed post SNF. ? ?Telephone call made to [facility] (nephew/DPR) to discuss transition plans . However, no answer. HIPAA compliant voicemail message left to request return call.  ? ? ?Raiford Noble, MSN, RN,BSN ?[facility] Post Acute Care Coordinator ?[phone] [facility]) ?[phone]  (Toll free office)   ?

## 2021-11-26 ENCOUNTER — Other Ambulatory Visit (HOSPITAL_COMMUNITY): Payer: Self-pay

## 2021-11-26 DIAGNOSIS — N390 Urinary tract infection, site not specified: Secondary | ICD-10-CM | POA: Diagnosis not present

## 2021-11-26 DIAGNOSIS — I10 Essential (primary) hypertension: Secondary | ICD-10-CM | POA: Diagnosis not present

## 2021-11-26 DIAGNOSIS — R2689 Other abnormalities of gait and mobility: Secondary | ICD-10-CM | POA: Diagnosis not present

## 2021-11-26 DIAGNOSIS — K5900 Constipation, unspecified: Secondary | ICD-10-CM | POA: Diagnosis not present

## 2021-11-26 DIAGNOSIS — D649 Anemia, unspecified: Secondary | ICD-10-CM | POA: Diagnosis not present

## 2021-11-26 DIAGNOSIS — K219 Gastro-esophageal reflux disease without esophagitis: Secondary | ICD-10-CM | POA: Diagnosis not present

## 2021-11-26 DIAGNOSIS — M6281 Muscle weakness (generalized): Secondary | ICD-10-CM | POA: Diagnosis not present

## 2021-11-27 ENCOUNTER — Other Ambulatory Visit (HOSPITAL_COMMUNITY): Payer: Self-pay

## 2021-11-27 DIAGNOSIS — Z7689 Persons encountering health services in other specified circumstances: Secondary | ICD-10-CM | POA: Diagnosis not present

## 2021-11-27 DIAGNOSIS — N390 Urinary tract infection, site not specified: Secondary | ICD-10-CM | POA: Diagnosis not present

## 2021-11-27 MED ORDER — NITROFURANTOIN MONOHYD MACRO 100 MG PO CAPS
100.0000 mg | ORAL_CAPSULE | Freq: Two times a day (BID) | ORAL | 0 refills | Status: DC
  Filled 2021-11-27: qty 9, 5d supply, fill #0

## 2021-12-01 ENCOUNTER — Ambulatory Visit (INDEPENDENT_AMBULATORY_CARE_PROVIDER_SITE_OTHER): Payer: Medicare Other | Admitting: Internal Medicine

## 2021-12-01 ENCOUNTER — Encounter: Payer: Self-pay | Admitting: Internal Medicine

## 2021-12-01 ENCOUNTER — Other Ambulatory Visit (HOSPITAL_COMMUNITY): Payer: Self-pay

## 2021-12-01 DIAGNOSIS — F419 Anxiety disorder, unspecified: Secondary | ICD-10-CM

## 2021-12-01 DIAGNOSIS — R911 Solitary pulmonary nodule: Secondary | ICD-10-CM

## 2021-12-01 DIAGNOSIS — J189 Pneumonia, unspecified organism: Secondary | ICD-10-CM | POA: Diagnosis not present

## 2021-12-01 MED ORDER — PROPRANOLOL HCL ER 60 MG PO CP24
60.0000 mg | ORAL_CAPSULE | Freq: Every day | ORAL | 3 refills | Status: DC
  Filled 2021-12-01: qty 90, 90d supply, fill #0
  Filled 2022-03-29: qty 90, 90d supply, fill #1
  Filled 2022-09-10: qty 90, 90d supply, fill #2

## 2021-12-01 MED ORDER — CLONAZEPAM 0.5 MG PO TABS
0.5000 mg | ORAL_TABLET | Freq: Two times a day (BID) | ORAL | 5 refills | Status: DC
  Filled 2021-12-01: qty 60, 30d supply, fill #0
  Filled 2022-01-14: qty 60, 30d supply, fill #1
  Filled 2022-02-11: qty 60, 30d supply, fill #2
  Filled 2022-04-16: qty 60, 30d supply, fill #3
  Filled 2022-05-14 – 2022-05-17 (×2): qty 60, 30d supply, fill #4

## 2021-12-01 NOTE — Patient Instructions (Signed)
We will check on [facility] and the meals at home. ?

## 2021-12-01 NOTE — Progress Notes (Signed)
   Subjective:   Patient ID: Tina Riley, female    DOB: Oct 22, 1931, 88 y.o.   MRN: [id number]  HPI The patient is an 88 YO female coming in for discharge from SNF. She went home Friday and is struggling with strength. She has fallen once since being home. She was told she could get meals for 14 days after discharge but this has not been arranged. She is weak and does not feel ready to leave but financially was discharged. She denies stomach pain. Since eating 3 meals regularly and going to bathroom more regularly she has not had any colon flares. Some anxiety. She does think about the future and her nephew and herself are talking about an ALF and the finances do limit this.  PMH, [facility], social history reviewed and updated  Review of Systems  Constitutional:  Positive for activity change and fatigue.  HENT: Negative.    Eyes: Negative.   Respiratory:  Negative for cough, chest tightness and shortness of breath.   Cardiovascular:  Negative for chest pain, palpitations and leg swelling.  Gastrointestinal:  Negative for abdominal distention, abdominal pain, constipation, diarrhea, nausea and vomiting.  Musculoskeletal: Negative.   Skin: Negative.   Neurological:  Positive for weakness.  Psychiatric/Behavioral:  The patient is nervous/anxious.    Objective:  Physical Exam Constitutional:      Appearance: She is well-developed.     Comments: Chronically ill appearing  HENT:     Head: Normocephalic and atraumatic.  Cardiovascular:     Rate and Rhythm: Normal rate and regular rhythm.  Pulmonary:     Effort: Pulmonary effort is normal. No respiratory distress.     Breath sounds: Normal breath sounds. No wheezing or rales.  Abdominal:     General: Bowel sounds are normal. There is no distension.     Palpations: Abdomen is soft.     Tenderness: There is no abdominal tenderness. There is no rebound.  Musculoskeletal:     Cervical back: Normal range of motion.  Skin:    General:  Skin is warm and dry.  Neurological:     Mental Status: She is alert and oriented to person, place, and time.     Coordination: Coordination normal.    Vitals:   12/01/21 1557  BP: 120/68  Pulse: 78  Resp: 18  SpO2: 99%  Weight: 103 lb (46.7 kg)  Height: 4\' 11"  (1.499 m)    Assessment & Plan:  Visit time 30 minutes in face to face communication with patient and coordination of care, additional 15 minutes spent in record review, coordination or care, ordering tests, communicating/referring to other healthcare professionals, documenting in medical records all on the same day of the visit for total time 45 minutes spent on the visit.

## 2021-12-02 ENCOUNTER — Telehealth: Payer: Self-pay

## 2021-12-02 ENCOUNTER — Ambulatory Visit (INDEPENDENT_AMBULATORY_CARE_PROVIDER_SITE_OTHER): Payer: Medicare Other | Admitting: Licensed Clinical Social Worker

## 2021-12-02 DIAGNOSIS — Z139 Encounter for screening, unspecified: Secondary | ICD-10-CM

## 2021-12-02 DIAGNOSIS — F419 Anxiety disorder, unspecified: Secondary | ICD-10-CM

## 2021-12-02 DIAGNOSIS — E782 Mixed hyperlipidemia: Secondary | ICD-10-CM

## 2021-12-02 NOTE — Chronic Care Management (AMB) (Signed)
     Chronic Care Management  Collaboration  Note  12/02/2021 Name: Tina Riley MRN: [id number] DOB: 1931/12/31  Tina Riley is a 88 y.o. year old female who is a primary care patient of Okey Dupre Austin Miles, MD. The CCM team was consulted reference care coordination needs for  post discharge meals .  Assessment: Patient was not interviewed or contacted during this encounter.    Intervention:Conducted brief assessment, recommendations and relevant information discussed.  CCM LCSW collaborated with PCP  to assist with meeting patient's needs. Will also consult with CCM RN to see if patient would be good fit for her to follow.  Urgent Referral made to [facility] Care Guide to assist with setting up post discharge meals for patient.  Follow up Plan: No follow up scheduled with CCM LCSW at this time. Will follow up in 1 week .They will call office if needed prior to next encounter.   Review of patient past medical history, allergies, medications, and health status, including review of pertinent consultant reports was performed as part of comprehensive evaluation and provision of care management/care coordination services.    Sammuel Hines, LCSW Licensed Clinical Social Worker [facility] Management  [facility]  [phone]

## 2021-12-02 NOTE — Patient Instructions (Signed)
? ? ? ?  The Care Guide will contact you to reschedule the phone appointment   ?Patient was not contacted during this encounter.  LCSW collaborated with care team to accomplish patient's care plan goal  ? ?Sammuel Hines, LCSW ?Licensed Clinical Social Worker [facility]  ?[facility]  ?[phone]  ?

## 2021-12-02 NOTE — Telephone Encounter (Signed)
? ?  Telephone encounter was:  Successful.  ?12/02/2021 ?Name: Tina Riley MRN: [id number] DOB: [birth year] ? ?Tina Riley is a 88 y.o. year old female who is a primary care patient of Myrlene Broker, MD . The community resource team was consulted for assistance with  moms meals ? ?Care guide performed the following interventions: Patient provided with information about care guide support team and interviewed to confirm resource needs.Put in a referral with moms meals and going to reach back out to meals on wheels for the patient ? ?Follow Up Plan:  Care guide will follow up with patient by phone over the next week ? ? ? ?Lenard Forth ?Care Guide, Embedded Care Coordination ?, Care Management  ?[phone] ?[address] [place], [place], [place] [address] ?Phone: [phone] ?Email: [email or web address] ? ?  ?

## 2021-12-03 ENCOUNTER — Other Ambulatory Visit (HOSPITAL_COMMUNITY): Payer: Self-pay

## 2021-12-03 NOTE — Assessment & Plan Note (Signed)
Needs CT scan in 3 months to reassess and discussed with patient and her nephew today.

## 2021-12-03 NOTE — Assessment & Plan Note (Signed)
She is having more anxiety with these falls recently and have asked our social worker to get involved in her care. I think she would clearly benefit from ALF due to increased social interaction. Her nephew noticed a positive change while she was in SNF due to interactions with others. She is using clonazepam 0.5 mg BID prn and has had negative reactions with zoloft and similar in the past and does not want to retry at this time.

## 2021-12-03 NOTE — Assessment & Plan Note (Signed)
Breathing normal and lungs clear on exam. Needs follow up CT 3 months from now discussed with patient and nephew.

## 2021-12-04 ENCOUNTER — Ambulatory Visit: Payer: Medicare Other | Admitting: *Deleted

## 2021-12-04 ENCOUNTER — Telehealth: Payer: Self-pay

## 2021-12-04 DIAGNOSIS — W19XXXA Unspecified fall, initial encounter: Secondary | ICD-10-CM

## 2021-12-04 DIAGNOSIS — J4540 Moderate persistent asthma, uncomplicated: Secondary | ICD-10-CM

## 2021-12-04 DIAGNOSIS — E782 Mixed hyperlipidemia: Secondary | ICD-10-CM

## 2021-12-04 NOTE — Telephone Encounter (Signed)
   Telephone encounter was:  Successful.  12/04/2021 Name: Tina Riley MRN: [id number] DOB: [birth year]  Tina Riley is a 88 y.o. year old female who is a primary care patient of Okey Dupre Austin Miles, MD . The community resource team was consulted for assistance with  meals  Care guide performed the following interventions: Patient provided with information about care guide support team and interviewed to confirm resource needs.Moms Meals has been processed 28 meals starting next week and meals on wheels is trying to expedite delivery to Patient  Follow Up Plan:  No further follow up planned at this time. The patient has been provided with needed resources.    Lenard Forth Care Guide, Embedded Care Coordination [facility], Care Management  [phone] [address] [place], [place], [place] [address] Phone: [phone] Email: [email or web address]

## 2021-12-04 NOTE — Chronic Care Management (AMB) (Signed)
Chronic Care Management   CCM RN Visit Note  12/04/2021 Name: Tina Riley MRN: [id number] DOB: [birth year]  Subjective: Tina Riley is a 88 y.o. year old female who is a primary care patient of Hoyt Koch, MD. The care management team was consulted for assistance with disease management and care coordination needs.    Engaged with patient by telephone for initial visit in response to provider referral for case management and/or care coordination services.   Consent to Services:  The patient was given information about Chronic Care Management services, agreed to services, and gave verbal consent 08/04/21 prior to initiation of services.  Please see initial visit note for detailed documentation.  Patient agreed to services and verbal consent obtained.   Assessment: Review of patient past medical history, allergies, medications, health status, including review of consultants reports, laboratory and other test data, was performed as part of comprehensive evaluation and provision of chronic care management services.   SDOH (Social Determinants of Health) assessments and interventions performed:  SDOH Interventions    Flowsheet Row Most Recent Value  SDOH Interventions   Food Insecurity Interventions Intervention Not Indicated  [nephew assists with food acquisition,  [place] active in assisting with [place] Interventions Intervention Not Indicated  [Lives alone in first floor apartment,  has handicapped accessible bathroom,  nephew visits frequently and assists with needs,  reports concerns around areas of slick flooring in her apartment,  nephew currently looking into ALF's]  Transportation Interventions Intervention Not Indicated  [Family/ nephew provides transportation]     CCM Care Plan  Allergies  Allergen Reactions   Cephalexin Palpitations   Ciprofloxacin Palpitations   Other Itching   Meperidine Hcl     Other  reaction(s): Other Doesn't work   Sulfa Antibiotics Nausea And Vomiting   Cefdinir Nausea Only   Doxycycline Nausea Only   Outpatient Encounter Medications as of 12/04/2021  Medication Sig   acetaminophen (TYLENOL) 500 MG tablet Take 500 mg by mouth daily as needed for headache or mild pain.   albuterol (VENTOLIN HFA) 108 (90 Base) MCG/ACT inhaler Inhale 2 puffs into the lungs every 6 (six) hours as needed for wheezing or shortness of breath.   bisacodyl (DULCOLAX) 5 MG EC tablet Take 1 tablet (5 mg total) by mouth daily as needed for moderate constipation.   clonazePAM (KLONOPIN) 0.5 MG tablet Take 1 tablet (0.5 mg total) by mouth 2 (two) times daily.   gabapentin (NEURONTIN) 100 MG capsule Take 2 capsules (200 mg total) by mouth 2 (two) times daily.   levocetirizine (XYZAL) 5 MG tablet Take 1 tablet (5 mg total) by mouth daily. (Patient taking differently: Take 5 mg by mouth every evening.)   Melatonin 3 MG CAPS Take 1 capsule (3 mg total) by mouth at bedtime.   ondansetron (ZOFRAN) 4 MG tablet Take 1 tablet (4 mg total) by mouth every 6 (six) hours as needed for nausea.   pantoprazole (PROTONIX) 40 MG tablet Take 1 tablet (40 mg total) by mouth 2 (two) times daily. (Patient taking differently: Take 40 mg by mouth daily.)   Polyethyl Glycol-Propyl Glycol (SYSTANE OP) Place 1 drop into both eyes daily as needed (dry eyes).   propranolol ER (INDERAL LA) 60 MG 24 hr capsule Take 1 capsule (60 mg total) by mouth daily.   psyllium (HYDROCIL/METAMUCIL) 95 % PACK Take 1 packet by mouth daily.   rosuvastatin (CRESTOR) 5 MG tablet Take 1 tablet (5 mg total) by  mouth at bedtime.   senna-docusate (SENOKOT-S) 8.6-50 MG tablet Take 1 tablet by mouth 2 (two) times daily.   SYMBICORT 160-4.5 MCG/ACT inhaler Inhale 2 puffs into the lungs 2 (two) times daily.   valACYclovir (VALTREX) 1000 MG tablet Take 1,000 mg by mouth daily.   No facility-administered encounter medications on file as of 12/04/2021.    Patient Active Problem List   Diagnosis Date Noted   CAP (community acquired pneumonia) 11/04/2021   Fall at home, initial encounter 11/04/2021   Abdominal pain 11/04/2021   Constipation 11/04/2021   Renal insufficiency 11/04/2021   Pulmonary nodule 11/04/2021   Dysphagia 09/18/2021   Hypokalemia 08/14/2021   Cognitive change 08/14/2021   Hyponatremia 08/12/2021   Abnormal urine 08/11/2021   Anxiety 02/24/2021   GERD (gastroesophageal reflux disease) 02/24/2021   Hair thinning 12/25/2020   HSV (herpes simplex virus) infection 10/17/2020   Allergic rhinitis 10/17/2020   Hyperlipidemia 10/17/2020   Diarrhea 10/17/2020   Asthma 10/17/2020   Insomnia 10/17/2020   Poor appetite 10/17/2020   Tremor 10/17/2020   Conditions to be addressed/monitored:  Asthma and frequent falls  Care Plan : RN Care Manager Plan of Care  Updates made by Michaela Corner, RN since 12/04/2021 12:00 AM     Problem: Chronic Disease Management Needs   Priority: High     Long-Range Goal: Development of plan of care for long term chronic disease management   Start Date: 12/04/2021  Expected End Date: 12/05/2022  Priority: High  Note:   Current Barriers:  Chronic Disease Management support and education needs related to asthma and frequent falls at home Two recent unplanned inpatient hospitalizations since January 2023; most recent: April 18-25 for UTI sepsis/ CAP; discharged to SNF for rehabilitation [facility]); discharged home from [facility] with home health services in place through [place]  RNCM Clinical Goal(s):  Patient will demonstrate ongoing health management independence as evidenced by adherence to plan of care for asthma and frequent falls at home        through collaboration with RN Care manager, provider, and care team.   Interventions: 1:1 collaboration with primary care provider regarding development and update of comprehensive plan of care as evidenced by provider attestation and  co-signature Inter-disciplinary care team collaboration (see longitudinal plan of care) Evaluation of current treatment plan related to  self management and patient's adherence to plan as established by provider 12/04/21- CCM RN CM Initial assessment completed Review of patient status, including review of consultants reports, relevant laboratory and other test results, and medications completed SDOH assessment completed: no new/ unmet concerns identified-- she has CCM CSW and [facility] team involved Pain assessment updated: reports 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: she believes this is a contributing factor to her numerous falls; reports taking gabapentin for pain with minimal effectiveness, states "they hurt all the time" Falls assessment updated: reports multiple recent falls x 12 months- using walker "all the time;" she also reports she has a "type of wheelchair" that she is unable to maneuver, however, her supportive/ involved nephew/ caregiver often uses this to assist in transporting patient or assisting when she falls; states that she "is very careful" and "afraid" of falling and takes "every precaution" to prevent falls; however, she has still experienced several falls with significant injury; encouragement provided to continue and expand efforts at fall prevention; she tells me today that she "mainly falls near the hallway," "where the floor is slick;" we discussed possible options to modify  the area with rugs that would not contribute to falls-- she and her family are working together to ensure that her flooring is not a barrier to fall prevention Reviewed recent hospitalization/ SNF rehabilitation visit: she verbalizes a good understanding of same and denies current clinical concerns/ questions post-discharge home Confirms that [facility] will be coming out to visit her post- SNF discharge; states her nephew is handling details of getting home health  team scheduled Medications discussed: reports independently self-manage and denies current concerns/ issues/ questions around medications; endorses adherence to taking all medications as prescribed Reviewed upcoming scheduled provider appointments: 12/22/21- GI provider; 05/10/22- PCP; patient confirms is aware of all and has plans to attend as scheduled Discussed plans with patient for ongoing care management follow up and provided patient with direct contact information for care management team     Asthma: (Status: 12/04/21: New goal.) Long Term Goal  Provided patient with basic written and verbal Asthma education on self care/management/and exacerbation prevention; Advised patient to track and manage Asthma triggers;  Provided instruction about proper use of medications used for management of Asthma including inhalers; Discussed the importance of adequate rest and management of fatigue with Asthma; Confirmed patient has no current clinical concerns around breathing status; she tells me her asthma is "under control;" has not had any recent flares Confirmed patient understands difference between maintenance inhaler and rescue inhaler- she endorses adherence to using both as prescribed; reports infrequent use of rescue inhaler  Falls:  (Status: 12/04/21: New goal.) Long Term Goal  Provided written and verbal education re: potential causes of falls and Fall prevention strategies Advised patient of importance of notifying provider of falls Assessed for signs and symptoms of orthostatic hypotension Provided patient information for fall alert systems Assessed working status of life alert bracelet and patient adherence Used to have life-alert; reports to obtain new life alert "soon" to malfunction of old one Keeps cell phone on her "all the time;" currently, if she falls, her nephew/ caregiver come to her apartment and assist her in getting up Reports her nephew is currently looking into ALF options-  states "they all have wait lists;" she is interested in ALF options and would like the socialization of living near others; acknowledges that she continues working with CCM CSW for options around in-home care assistance- encouraged her ongoing engagement with CCM CSW: she verbalizes understanding and agreement  Patient Goals/Self-Care Activities: As evidenced by review of EHR, collaboration with care team, and patient reporting during CCM RN CM outreach,  Patient [name] will: Take medications as prescribed Attend all scheduled provider appointments Call pharmacy for medication refills Call provider office for new concerns or questions Take every effort to prevent falls at home, as you have been doing: use your walker and begin using your life alert once you receive it; continue thinking about specific strategies you can take to prevent falls near your hallway, where you say that most of your falls occur; talk to your nephew about this area in your home Continue working with the Clinical Social Worker at Dr. Nathanial Millman office Review enclosed educational material about preventing falls       Plan: Telephone follow up appointment with care management team member scheduled for: Tuesday, January 05, 2022 at 2:15 pm The patient has been provided with contact information for the care management team and has been advised to call with any health related questions or concerns  Oneta Rack, RN, BSN, [facility] RN Care Coordination- [facility]  [facility] [phone]: direct office

## 2021-12-04 NOTE — Patient Instructions (Signed)
Visit Information   Tina Riley, thank you for taking time to talk with me today. Please don't hesitate to contact me if I can be of assistance to you before our next scheduled telephone appointment  Below are the goals we discussed today:  Patient Self-Care Activities: Patient Tina Riley will: Take medications as prescribed Attend all scheduled provider appointments Call pharmacy for medication refills Call provider office for new concerns or questions Take every effort to prevent falls at home, as you have been doing: use your walker and begin using your life alert once you receive it; continue thinking about specific strategies you can take to prevent falls near your hallway, where you say that most of your falls occur; talk to your nephew about this area in your home Continue working with the Clinical Social Worker at Dr. Nathanial Millman office Review enclosed educational material about preventing falls   Our next scheduled telephone follow up visit/ appointment is scheduled on: Tuesday, January 05, 2022 at 2:15 pm- This is a PHONE [place] appointment  If you need to cancel or re-schedule our visit, please call [phone] and our care guide team will be happy to assist you.   I look forward to hearing about your progress.   Oneta Rack, RN, BSN, [place] [phone]: direct office  If you are experiencing a Mental Health or [place] or need someone to talk to, please  call the Suicide and Crisis Lifeline: 988 call the [place] National Suicide Prevention Lifeline: 563-233-2398 or TTY: (320)010-8748 TTY (914) 402-1960) to talk to a trained counselor call 1-800-273-TALK (toll free, 24 hour hotline) go to [facility] [address], [place] [phone]) call 911   Fall Prevention in the Home, Adult Falls can cause injuries and can happen to people of all ages. There are many things you  can do to make your home safe and to help prevent falls. Ask for help when making these changes. What actions can I take to prevent falls? General Instructions Use good lighting in all rooms. Replace any light bulbs that burn out. Turn on the lights in dark areas. Use night-lights. Keep items that you use often in easy-to-reach places. Lower the shelves around your home if needed. Set up your furniture so you have a clear path. Avoid moving your furniture around. Do not have throw rugs or other things on the floor that can make you trip. Avoid walking on wet floors. If any of your floors are uneven, fix them. Add color or contrast paint or tape to clearly mark and help you see: Grab bars or handrails. First and last steps of staircases. Where the edge of each step is. If you use a stepladder: Make sure that it is fully opened. Do not climb a closed stepladder. Make sure the sides of the stepladder are locked in place. Ask someone to hold the stepladder while you use it. Know where your pets are when moving through your home. What can I do in the bathroom?     Keep the floor dry. Clean up any water on the floor right away. Remove soap buildup in the tub or shower. Use nonskid mats or decals on the floor of the tub or shower. Attach bath mats securely with double-sided, nonslip rug tape. If you need to sit down in the shower, use a plastic, nonslip stool. Install grab bars by the toilet and in the tub and shower. Do not use  towel bars as grab bars. What can I do in the bedroom? Make sure that you have a light by your bed that is easy to reach. Do not use any sheets or blankets for your bed that hang to the floor. Have a firm chair with side arms that you can use for support when you get dressed. What can I do in the kitchen? Clean up any spills right away. If you need to reach something above you, use a step stool with a grab bar. Keep electrical cords out of the way. Do not use  floor polish or wax that makes floors slippery. What can I do with my stairs? Do not leave any items on the stairs. Make sure that you have a light switch at the top and the bottom of the stairs. Make sure that there are handrails on both sides of the stairs. Fix handrails that are broken or loose. Install nonslip stair treads on all your stairs. Avoid having throw rugs at the top or bottom of the stairs. Choose a carpet that does not hide the edge of the steps on the stairs. Check carpeting to make sure that it is firmly attached to the stairs. Fix carpet that is loose or worn. What can I do on the outside of my home? Use bright outdoor lighting. Fix the edges of walkways and driveways and fix any cracks. Remove anything that might make you trip as you walk through a door, such as a raised step or threshold. Trim any bushes or trees on paths to your home. Check to see if handrails are loose or broken and that both sides of all steps have handrails. Install guardrails along the edges of any raised decks and porches. Clear paths of anything that can make you trip, such as tools or rocks. Have leaves, snow, or ice cleared regularly. Use sand or salt on paths during winter. Clean up any spills in your garage right away. This includes grease or oil spills. What other actions can I take? Wear shoes that: Have a low heel. Do not wear high heels. Have rubber bottoms. Feel good on your feet and fit well. Are closed at the toe. Do not wear open-toe sandals. Use tools that help you move around if needed. These include: Canes. Walkers. Scooters. Crutches. Review your medicines with your doctor. Some medicines can make you feel dizzy. This can increase your chance of falling. Ask your doctor what else you can do to help prevent falls. Where to find more information Centers for Disease Control and Prevention, [employer]: http://www.wolf.info/ National Institute on Aging: http://kim-miller.com/ Contact a doctor  if: You are afraid of falling at home. You feel weak, drowsy, or dizzy at home. You fall at home. Summary There are many simple things that you can do to make your home safe and to help prevent falls. Ways to make your home safe include removing things that can make you trip and installing grab bars in the bathroom. Ask for help when making these changes in your home. This information is not intended to replace advice given to you by your health care provider. Make sure you discuss any questions you have with your health care provider. Document Revised: 04/06/2021 Document Reviewed: 02/06/2020 Elsevier Patient Education  [place] is a copy of your full care plan:  Care Plan : RN Care Manager Plan of Care  Updates made by Knox Royalty, RN since 12/04/2021 12:00 AM     Problem: Chronic  Disease Management Needs   Priority: High     Long-Range Goal: Development of plan of care for long term chronic disease management   Start Date: 12/04/2021  Expected End Date: 12/05/2022  Priority: High  Note:   Current Barriers:  Chronic Disease Management support and education needs related to asthma and frequent falls at home Two recent unplanned inpatient hospitalizations since January 2023; most recent: April 18-25 for UTI sepsis/ CAP; discharged to SNF for [facility]); discharged home from [place] with home health services in place through [place]):  Patient will demonstrate ongoing health management independence as evidenced by adherence to plan of care for asthma and frequent falls at home        through collaboration with RN Care manager, provider, and care team.   Interventions: 1:1 collaboration with primary care provider regarding development and update of comprehensive plan of care as evidenced by provider attestation and co-signature Inter-disciplinary care team collaboration (see longitudinal plan of care) Evaluation of  current treatment plan related to  self management and patient's adherence to plan as established by provider 12/04/21- CCM RN CM Initial assessment completed Review of patient status, including review of consultants reports, relevant laboratory and other test results, and medications completed SDOH assessment completed: no new/ unmet concerns identified-- she has CCM CSW and [employer] team involved Pain assessment updated: reports 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: she believes this is a contributing factor to her numerous falls; reports taking gabapentin for pain with minimal effectiveness, states "they hurt all the time" Falls assessment updated: reports multiple recent falls x 12 months- using walker "all the time;" she also reports she has a "type of wheelchair" that she is unable to maneuver, however, her supportive/ involved nephew/ caregiver often uses this to assist in transporting patient or assisting when she falls; states that she "is very careful" and "afraid" of falling and takes "every precaution" to prevent falls; however, she has still experienced several falls with significant injury; encouragement provided to continue and expand efforts at fall prevention; she tells me today that she "mainly falls near the hallway," "where the floor is slick;" we discussed possible options to modify the area with rugs that would not contribute to falls-- she and her family are working together to ensure that her flooring is not a barrier to fall prevention Reviewed recent hospitalization/ SNF rehabilitation visit: she verbalizes a good understanding of same and denies current clinical concerns/ questions post-discharge home Confirms that [place] home health will be coming out to visit her post- SNF discharge; states her nephew is handling details of getting home health team scheduled Medications discussed: reports independently self-manage and denies current concerns/  issues/ questions around medications; endorses adherence to taking all medications as prescribed Reviewed upcoming scheduled provider appointments: 12/22/21- GI provider; 05/10/22- PCP; patient confirms is aware of all and has plans to attend as scheduled Discussed plans with patient for ongoing care management follow up and provided patient with direct contact information for care management team     Asthma: (Status: 12/04/21: New goal.) Long Term Goal  Provided patient with basic written and verbal Asthma education on self care/management/and exacerbation prevention; Advised patient to track and manage Asthma triggers;  Provided instruction about proper use of medications used for management of Asthma including inhalers; Discussed the importance of adequate rest and management of fatigue with Asthma; Confirmed patient has no current clinical concerns around breathing status; she tells me her asthma  is "under control;" has not had any recent flares Confirmed patient understands difference between maintenance inhaler and rescue inhaler- she endorses adherence to using both as prescribed; reports infrequent use of rescue inhaler  Falls:  (Status: 12/04/21: New goal.) Long Term Goal  Provided written and verbal education re: potential causes of falls and Fall prevention strategies Advised patient of importance of notifying provider of falls Assessed for signs and symptoms of orthostatic hypotension Provided patient information for fall alert systems Assessed working status of life alert bracelet and patient adherence Used to have life-alert; reports to obtain new life alert "soon" to malfunction of old one Keeps cell phone on her "all the time;" currently, if she falls, her nephew/ caregiver come to her apartment and assist her in getting up Reports her nephew is currently looking into ALF options- states "they all have wait lists;" she is interested in ALF options and would like the socialization of  living near others; acknowledges that she continues working with CCM CSW for options around in-home care assistance- encouraged her ongoing engagement with CCM CSW: she verbalizes understanding and agreement  Patient Goals/Self-Care Activities: As evidenced by review of EHR, collaboration with care team, and patient reporting during CCM RN CM outreach,  Patient Tina Riley will: Take medications as prescribed Attend all scheduled provider appointments Call pharmacy for medication refills Call provider office for new concerns or questions Take every effort to prevent falls at home, as you have been doing: use your walker and begin using your life alert once you receive it; continue thinking about specific strategies you can take to prevent falls near your hallway, where you say that most of your falls occur; talk to your nephew about this area in your home Continue working with the Clinical Social Worker at Dr. Nathanial Millman office Review enclosed educational material about preventing falls       Consent to CCM Services: Tina Riley was given information about Chronic Care Management services 08/04/21 including:  CCM service includes personalized support from designated clinical staff supervised by her physician, including individualized plan of care and coordination with other care providers 24/7 contact phone numbers for assistance for urgent and routine care needs. Service will only be billed when office clinical staff spend 20 minutes or more in a month to coordinate care. Only one practitioner may furnish and bill the service in a calendar month. The patient may stop CCM services at any time (effective at the end of the month) by phone call to the office staff. The patient will be responsible for cost sharing (co-pay) of up to 20% of the service fee (after annual deductible is met).  Patient agreed to services and verbal consent obtained.   The patient verbalized understanding of instructions,  educational materials, and care plan provided today and agreed to receive a mailed copy of patient instructions, educational materials, and care plan Telephone follow up appointment with care management team member scheduled for:  Tuesday, January 05, 2022 at 2:15 pm The patient has been provided with contact information for the care management team and has been advised to call with any health related questions or concerns

## 2021-12-14 ENCOUNTER — Other Ambulatory Visit: Payer: Self-pay | Admitting: Internal Medicine

## 2021-12-14 ENCOUNTER — Encounter: Payer: Self-pay | Admitting: Internal Medicine

## 2021-12-14 DIAGNOSIS — J189 Pneumonia, unspecified organism: Secondary | ICD-10-CM

## 2021-12-14 DIAGNOSIS — Y92009 Unspecified place in unspecified non-institutional (private) residence as the place of occurrence of the external cause: Secondary | ICD-10-CM

## 2021-12-15 ENCOUNTER — Other Ambulatory Visit (HOSPITAL_COMMUNITY): Payer: Self-pay

## 2021-12-15 MED ORDER — VALACYCLOVIR HCL 1 G PO TABS
1000.0000 mg | ORAL_TABLET | Freq: Every day | ORAL | 3 refills | Status: DC
  Filled 2021-12-15: qty 30, 30d supply, fill #0
  Filled 2022-01-14: qty 30, 30d supply, fill #1
  Filled 2022-02-11: qty 30, 30d supply, fill #2
  Filled 2022-04-16: qty 30, 30d supply, fill #3
  Filled 2022-05-14: qty 30, 30d supply, fill #4
  Filled 2022-06-17: qty 30, 30d supply, fill #5
  Filled 2022-08-19: qty 30, 30d supply, fill #6
  Filled 2022-09-14: qty 30, 30d supply, fill #7
  Filled 2022-11-15: qty 30, 30d supply, fill #8
  Filled 2022-12-14: qty 30, 30d supply, fill #9

## 2021-12-16 ENCOUNTER — Other Ambulatory Visit (HOSPITAL_COMMUNITY): Payer: Self-pay

## 2021-12-16 ENCOUNTER — Other Ambulatory Visit: Payer: Self-pay | Admitting: Internal Medicine

## 2021-12-16 DIAGNOSIS — J45909 Unspecified asthma, uncomplicated: Secondary | ICD-10-CM

## 2021-12-17 ENCOUNTER — Other Ambulatory Visit (HOSPITAL_COMMUNITY): Payer: Self-pay

## 2021-12-17 MED ORDER — GABAPENTIN 100 MG PO CAPS
300.0000 mg | ORAL_CAPSULE | Freq: Every day | ORAL | 5 refills | Status: DC
  Filled 2021-12-17: qty 90, 30d supply, fill #0
  Filled 2022-01-14: qty 90, 30d supply, fill #1
  Filled 2022-02-11: qty 90, 30d supply, fill #2
  Filled 2022-03-29: qty 90, 30d supply, fill #3
  Filled 2022-04-24: qty 90, 30d supply, fill #4

## 2021-12-22 ENCOUNTER — Ambulatory Visit: Payer: Medicare Other | Admitting: Internal Medicine

## 2022-01-05 ENCOUNTER — Ambulatory Visit: Payer: Self-pay | Admitting: Licensed Clinical Social Worker

## 2022-01-05 ENCOUNTER — Ambulatory Visit (INDEPENDENT_AMBULATORY_CARE_PROVIDER_SITE_OTHER): Payer: Medicare Other | Admitting: *Deleted

## 2022-01-05 DIAGNOSIS — J4540 Moderate persistent asthma, uncomplicated: Secondary | ICD-10-CM

## 2022-01-05 DIAGNOSIS — E782 Mixed hyperlipidemia: Secondary | ICD-10-CM

## 2022-01-05 DIAGNOSIS — F419 Anxiety disorder, unspecified: Secondary | ICD-10-CM

## 2022-01-05 NOTE — Chronic Care Management (AMB) (Signed)
Chronic Care Management   CCM RN Visit Note  01/05/2022 Name: Tina Riley MRN: [id number] DOB: May 04, 1932  Subjective: Tina Riley is a 88 y.o. year old female who is a primary care patient of Tina Broker, MD. The care management team was consulted for assistance with disease management and care coordination needs.    Engaged with patient by telephone for follow up visit in response to provider referral for case management and/or care coordination services.   Consent to Services:  The patient was given information about Chronic Care Management services, agreed to services, and gave verbal consent prior to initiation of services.  Please see initial visit note for detailed documentation.  Patient agreed to services and verbal consent obtained.   Assessment: Review of patient past medical history, allergies, medications, health status, including review of consultants reports, laboratory and other test data, was performed as part of comprehensive evaluation and provision of chronic care management services.   SDOH (Social Determinants of Health) assessments and interventions performed:  SDOH Interventions    Flowsheet Row Most Recent Value  SDOH Interventions   Transportation Interventions Intervention Not Indicated  [nephew continues providing transportation]     CCM Care Plan  Allergies  Allergen Reactions   Cephalexin Palpitations   Ciprofloxacin Palpitations   Other Itching   Meperidine Hcl     Other reaction(s): Other Doesn't work   Sulfa Antibiotics Nausea And Vomiting   Cefdinir Nausea Only   Doxycycline Nausea Only   Outpatient Encounter Medications as of 01/05/2022  Medication Sig   acetaminophen (TYLENOL) 500 MG tablet Take 500 mg by mouth daily as needed for headache or mild pain.   albuterol (VENTOLIN HFA) 108 (90 Base) MCG/ACT inhaler Inhale 2 puffs into the lungs every 6 (six) hours as needed for wheezing or shortness of breath.    bisacodyl (DULCOLAX) 5 MG EC tablet Take 1 tablet (5 mg total) by mouth daily as needed for moderate constipation.   clonazePAM (KLONOPIN) 0.5 MG tablet Take 1 tablet (0.5 mg total) by mouth 2 (two) times daily.   gabapentin (NEURONTIN) 100 MG capsule Take 2 capsules (200 mg total) by mouth 2 (two) times daily.   gabapentin (NEURONTIN) 100 MG capsule Take 3 capsules (300 mg total) by mouth at bedtime.   levocetirizine (XYZAL) 5 MG tablet Take 1 tablet (5 mg total) by mouth daily. (Patient taking differently: Take 5 mg by mouth every evening.)   Melatonin 3 MG CAPS Take 1 capsule (3 mg total) by mouth at bedtime.   ondansetron (ZOFRAN) 4 MG tablet Take 1 tablet (4 mg total) by mouth every 6 (six) hours as needed for nausea.   pantoprazole (PROTONIX) 40 MG tablet Take 1 tablet (40 mg total) by mouth 2 (two) times daily. (Patient taking differently: Take 40 mg by mouth daily.)   Polyethyl Glycol-Propyl Glycol (SYSTANE OP) Place 1 drop into both eyes daily as needed (dry eyes).   propranolol ER (INDERAL LA) 60 MG 24 hr capsule Take 1 capsule (60 mg total) by mouth daily.   psyllium (HYDROCIL/METAMUCIL) 95 % PACK Take 1 packet by mouth daily.   rosuvastatin (CRESTOR) 5 MG tablet Take 1 tablet (5 mg total) by mouth at bedtime.   senna-docusate (SENOKOT-S) 8.6-50 MG tablet Take 1 tablet by mouth 2 (two) times daily.   SYMBICORT 160-4.5 MCG/ACT inhaler Inhale 2 puffs into the lungs 2 (two) times daily.   valACYclovir (VALTREX) 1000 MG tablet Take 1 tablet (1,000 mg total) by mouth  daily.   No facility-administered encounter medications on file as of 01/05/2022.   Patient Active Problem List   Diagnosis Date Noted   CAP (community acquired pneumonia) 11/04/2021   Fall at home, initial encounter 11/04/2021   Abdominal pain 11/04/2021   Constipation 11/04/2021   Renal insufficiency 11/04/2021   Pulmonary nodule 11/04/2021   Dysphagia 09/18/2021   Hypokalemia 08/14/2021   Cognitive change 08/14/2021    Hyponatremia 08/12/2021   Abnormal urine 08/11/2021   Anxiety 02/24/2021   GERD (gastroesophageal reflux disease) 02/24/2021   Hair thinning 12/25/2020   HSV (herpes simplex virus) infection 10/17/2020   Allergic rhinitis 10/17/2020   Hyperlipidemia 10/17/2020   Diarrhea 10/17/2020   Asthma 10/17/2020   Insomnia 10/17/2020   Poor appetite 10/17/2020   Tremor 10/17/2020   Conditions to be addressed/monitored:  HLD, Asthma, and falls  Care Plan : RN Care Manager Plan of Care  Updates made by Michaela Corner, RN since 01/05/2022 12:00 AM     Problem: Chronic Disease Management Needs   Priority: High     Long-Range Goal: Ongoing adherence to established plan of care for long term chronic disease management   Start Date: 12/04/2021  Expected End Date: 12/05/2022  Priority: High  Note:   Current Barriers:  Chronic Disease Management support and education needs related to asthma and frequent falls at home Two recent unplanned inpatient hospitalizations since January 2023; most recent: April 18-25 for UTI sepsis/ CAP; discharged to SNF for [facility]); discharged home from [facility] with home health services in place through [facility]  RNCM Clinical Goal(s):  Patient will demonstrate ongoing health management independence as evidenced by adherence to plan of care for asthma and frequent falls at home        through collaboration with RN Care manager, provider, and care team.   Interventions: 1:1 collaboration with primary care provider regarding development and update of comprehensive plan of care as evidenced by provider attestation and co-signature Inter-disciplinary care team collaboration (see longitudinal plan of care) Evaluation of current treatment plan related to  self management and patient's adherence to plan as established by provider 12/04/21- CCM RN CM Initial assessment completed Review of patient status, including review of consultants reports, relevant  laboratory and other test results, and medications completed SDOH updated: no new/ unmet concerns identified Pain assessment updated: reports vague mild abdominal pain today: reports "slowly getting better as the day has gone on;" denies nausea, vomiting and diarrhea Falls assessment updated: denies new/ recent falls since our last outreach 12/04/21- continues using walker "all the time;"  positive reinforcement provided with encouragement to continue efforts at fall prevention; previously provided education around fall risks/ prevention reinforced Medications discussed: reports continues to independently self-manage and denies current concerns/ issues/ questions around medications; endorses adherence to taking all medications as prescribed; confirms that her nephew continues assisting with picking up medications as needed Patient tells me that home health services "never came;" after recent hospital visit-- noted that PCP placed second order for same at end of May-- patient states she will ask her nephew if he heard form them, when she sees him next week; for now, she wishes to hold off on care coordination efforts through PCP team- wishes to discuss with nephew first, next week- confirms that nephew will reach out to PCP team as/ if needed next week, after she discussed with him Reviewed upcoming scheduled provider appointments: 01/12/22- GI provider; patient confirms is aware of all and has plans to attend as scheduled  Discussed plans with patient for ongoing care management follow up and provided patient with direct contact information for care management team     Asthma: (Status: 01/05/22: Goal on Track (progressing): YES.) Long Term Goal  Advised patient to track and manage Asthma triggers;  Provided instruction about proper use of medications used for management of Asthma including inhalers; Discussed the importance of adequate rest and management of fatigue with Asthma; Confirmed patient has no  current clinical concerns around breathing status; she tells me again that her asthma is "under control;" has not had any recent flares; she reports "breathing just fine" Confirmed patient understands difference between maintenance inhaler and rescue inhaler- she endorses adherence to using both as prescribed; reports infrequent use of rescue inhaler  Falls:  (Status: 01/05/22: Goal on Track (progressing): YES.) Long Term Goal  Provided written and verbal education re: potential causes of falls and Fall prevention strategies Reviewed medications and discussed potential side effects of medications such as dizziness and frequent urination Advised patient of importance of notifying provider of falls Assessed for signs and symptoms of orthostatic hypotension Assessed for falls since last encounter Assessed patients knowledge of fall risk prevention secondary to previously provided education Provided patient information for fall alert systems Assessed working status of life alert bracelet and patient adherence Reports has obtained new life-alert; currently life-alert still at her nephew's home, he is working to get it set up and she expects him to provide to her on Tuesday of next week when he takes her to GI provider office visit Confirms she continues to keep cell phone on her "all the time;" currently, if she falls, her nephew/ caregiver come to her apartment and assist her in getting up Again reports her nephew is currently looking into ALF options- states he has put her on wait list at [facility], and possibly other facilities- confirms nephew is handling the details  Patient Goals/Self-Care Activities: As evidenced by review of EHR, collaboration with care team, and patient reporting during CCM RN CM outreach,  Patient [name] will: Take medications as prescribed Attend all scheduled provider appointments Call pharmacy for medication refills Call provider office for new concerns or questions Take  every effort to prevent falls at home, as you have been doing: use your walker and begin using your life alert once you receive it; continue thinking about specific strategies you can take to prevent falls near your hallway, where you say that most of your falls occur; talk to your nephew about this area in your home    Plan: Telephone follow up appointment with care management team member scheduled for: Tuesday, February 09, 2022 at 2;15 pm The patient has been provided with contact information for the care management team and has been advised to call with any health related questions or concerns   Caryl Pina, RN, BSN, CCRN Alumnus CCM Clinic RN Care Coordination- [facility] [place] [phone]: direct office

## 2022-01-05 NOTE — Patient Instructions (Signed)
    I will disconnect from your care team at this time,, no social work needs identified at this time. Please call the office if additional needs are identified  Patient was not contacted during this encounter.  LCSW collaborated with care team to accomplish patient's care plan goal    Sammuel Hines, LCSW Licensed Clinical Social Worker Nutritional therapist [facility] [place]  [phone]

## 2022-01-05 NOTE — Patient Instructions (Addendum)
Visit Information Tina Riley, thank you for taking time to talk with me today. Please don't hesitate to contact me if I can be of assistance to you before our next scheduled telephone appointment.  Below are the goals we discussed today:  Patient Self-Care Activities: Patient Tina Riley will: Take medications as prescribed Attend all scheduled provider appointments Call pharmacy for medication refills Call provider office for new concerns or questions Take every effort to prevent falls at home, as you have been doing: use your walker and begin using your life alert once you receive it; continue thinking about specific strategies you can take to prevent falls near your hallway, where you say that most of your falls occur; talk to your nephew about this area in your home  Our next scheduled telephone follow up visit/ appointment is scheduled on: Tuesday, February 09, 2022 at 2:15 pm- This is a PHONE CALL appointment  If you need to cancel or re-schedule our visit, please call [phone] and our care guide team will be happy to assist you.   I look forward to hearing about your progress.   Tina Pina, RN, BSN, CCRN Alumnus CCM Clinic RN Care Coordination- [facility] Tina Riley [phone]: direct office  If you are experiencing a Mental Health or Behavioral Health Crisis or need someone to talk to, please  call the Suicide and Crisis Lifeline: 988 call the [place] National Suicide Prevention Lifeline: (680)627-2841 or TTY: (848)327-2847 TTY (973)506-7301) to talk to a trained counselor call 1-800-273-TALK (toll free, 24 hour hotline) go to [facility] [address], [place] [phone]) call 911   The patient verbalized understanding of instructions, educational materials, and care plan provided today and agreed to receive a mailed copy of patient instructions, educational materials, and care plan   Safety In The Home  Use a variety of textures, such  as Velcro, rubber bands and raised dots to provide tactile clues.  Apply to the on/off controls on appliances, at the end of the banister, or on medicine bottles.  Flooring  The following suggestions can help reduce the risk of a fall: Repair or replace torn carpet because a foot, cane or walker can easily get caught. Remove area carpets or throw rugs, especially if your loved one has a shuffling gait or uses a walker. When rugs  Or carpeting cannot be eliminated, place non-skid padding under rugs or secure to floor with double sided tape.  Area carpets without padding or tape can easily buckle underneath when walked on, causing a person to slip or fall. Use only matte, non-shiny finishes on the floor. Doorsills can be tripping hazards.  Remove them whenever possible or paint them a contrasting color. Eliminate low furniture that is easy to trip over such as coffee tables and footstools. Move furniture against walls to create a large area of uncluttered space in the center of the room.  However, if you are rearranging a room for someone else, discuss beforehand with that person.  Many individuals rely on specific locations of furniture to find their way around a room. It is easier to see the sofa or chair when its color contrasts with that of the flooring.  Choose a fabric that contrasts with the floor material or use a bright colored piping along the edges of the seat cushion. Reduce glare on polished furniture by covering it with a large doily or tablecloth.

## 2022-01-05 NOTE — Chronic Care Management (AMB) (Signed)
  Chronic Care Management   Clinical Social Work Note Collaboration  Note  01/05/2022 Name: Tina Riley MRN: [id number] DOB: Dec 08, 1931  Tina Riley is a 88 y.o. year old female who is a primary care patient of Sharlet Salina Real Cons, MD. The CCM team was consulted reference care coordination needs   Assessment: Patient was not interviewed or contacted during this encounter.    Intervention:Conducted brief assessment, recommendations and relevant information discussed.  CCM LCSW collaborated with CCM RN No LCSW needs identified.    Follow up Plan: CCM LCSW will disconnect from patient's care team at this time, but will be available at any time they would like to re-engage for care coordination services.    Review of patient past medical history, allergies, medications, and health status, including review of pertinent consultant reports was performed as part of comprehensive evaluation and provision of care management/care coordination services.     Assessment: Review of patient past medical history, allergies, medications, and health status, including review of relevant consultants reports was performed today as part of a comprehensive evaluation and provision of chronic care management and care coordination services.     SDOH (Social Determinants of Health) assessments and interventions performed:    Advanced Directives Status: See Vynca application for related entries.  CCM Care Plan  Care Plan : LCSW Plan of Care  Updates made by Maurine Cane, LCSW since 01/05/2022 12:00 AM     Problem: Mobility and Independence      Long-Range Goal: Mobility and Independence Optimized by addtional home support Completed 01/05/2022  Start Date: 08/04/2021  Recent Progress: On track  Priority: High  Note:   Current Barriers:  Level of Care Concerns:Inability to perform ADL's independently  CSW Clinical Goal(s):  Patient  will work with resources discussed to address needs  related to level of care and home needs  through collaboration with Clinical Education officer, museum, provider, and care team.   Interventions: 1:1 collaboration with primary care provider regarding development and update of comprehensive plan of care as evidenced by provider attestation and co-signature Inter-disciplinary care team collaboration (see longitudinal plan of care) Evaluation of current treatment plan related to  self management and patient's adherence to plan as established by provider Review resources, discussed options and provided patient information about  Department of Social Services ( on wait list for in-home aide program [phone] ) Referral for [facility] (meals on wheels pending anticipate in 30 days) Services provided by [employer]    Level of Care Concerns in a patient with Anxiety, GERD, and Asthma:  (Status: Goal on Track (progressing): YES.) Current level of care: home, alone and support system is nephew  Evaluation of patient safety in current living environment:  Solution-Focused Strategies employed:  Active listening / Reflection utilized  Problem [place] strategies reviewed Discussed Long-term care  Mental Health:  (Status: Goal Met.) Evaluation of current treatment plan related to Anxiety with Excessive Worry, Has therapy appointment scheduled   Patient Self-Care Activities: You are on the wait list fo DSS In-Home Aide program [phone] We discussed Long -term care and importance of exploring options. I have included a brochure on Special Assistance and a list of assisted living facilities.     Casimer Lanius, LCSW Licensed Clinical Social Worker [facility]  [place]  [phone]

## 2022-01-12 ENCOUNTER — Ambulatory Visit (INDEPENDENT_AMBULATORY_CARE_PROVIDER_SITE_OTHER)
Admission: RE | Admit: 2022-01-12 | Discharge: 2022-01-12 | Disposition: A | Discharge status: Home / Self Care | Payer: Medicare Other | Source: Ambulatory Visit | Attending: Internal Medicine | Admitting: Internal Medicine

## 2022-01-12 ENCOUNTER — Ambulatory Visit: Payer: Medicare Other | Admitting: Internal Medicine

## 2022-01-12 ENCOUNTER — Encounter: Payer: Self-pay | Admitting: Internal Medicine

## 2022-01-12 VITALS — BP 118/66 | HR 60 | Ht 59.0 in | Wt 95.0 lb

## 2022-01-12 DIAGNOSIS — R197 Diarrhea, unspecified: Secondary | ICD-10-CM

## 2022-01-12 DIAGNOSIS — I878 Other specified disorders of veins: Secondary | ICD-10-CM | POA: Diagnosis not present

## 2022-01-12 DIAGNOSIS — M419 Scoliosis, unspecified: Secondary | ICD-10-CM | POA: Diagnosis not present

## 2022-01-12 DIAGNOSIS — K529 Noninfective gastroenteritis and colitis, unspecified: Secondary | ICD-10-CM | POA: Diagnosis not present

## 2022-01-12 DIAGNOSIS — R634 Abnormal weight loss: Secondary | ICD-10-CM

## 2022-01-13 ENCOUNTER — Other Ambulatory Visit (HOSPITAL_COMMUNITY): Payer: Self-pay

## 2022-01-13 ENCOUNTER — Other Ambulatory Visit: Payer: Self-pay | Admitting: Internal Medicine

## 2022-01-13 DIAGNOSIS — K5900 Constipation, unspecified: Secondary | ICD-10-CM

## 2022-01-13 MED ORDER — LINACLOTIDE 72 MCG PO CAPS
72.0000 ug | ORAL_CAPSULE | Freq: Every day | ORAL | 3 refills | Status: DC
  Filled 2022-01-13: qty 30, 30d supply, fill #0
  Filled 2022-02-14: qty 30, 30d supply, fill #1

## 2022-01-14 ENCOUNTER — Other Ambulatory Visit (HOSPITAL_COMMUNITY): Payer: Self-pay

## 2022-01-15 DIAGNOSIS — E782 Mixed hyperlipidemia: Secondary | ICD-10-CM

## 2022-01-15 DIAGNOSIS — J4540 Moderate persistent asthma, uncomplicated: Secondary | ICD-10-CM

## 2022-01-21 ENCOUNTER — Ambulatory Visit: Payer: Medicare Other | Admitting: Psychologist

## 2022-02-09 ENCOUNTER — Ambulatory Visit: Payer: Medicare Other | Admitting: *Deleted

## 2022-02-09 DIAGNOSIS — J4540 Moderate persistent asthma, uncomplicated: Secondary | ICD-10-CM

## 2022-02-09 DIAGNOSIS — E782 Mixed hyperlipidemia: Secondary | ICD-10-CM

## 2022-02-09 NOTE — Chronic Care Management (AMB) (Signed)
Care Management    RN Visit Note  02/09/2022 Name: Tina Riley MRN: [id number] DOB: Dec 07, 1931  Subjective: Tina Riley is a 88 y.o. year old female who is a primary care patient of Hoyt Koch, MD. The care management team was consulted for assistance with disease management and care coordination needs.    Engaged with patient by telephone for follow up visit/ RN CM case closure in response to provider referral for case management and/or care coordination services.   Consent to Services:   Tina Riley was given information about Care Management services 08/04/21 including:  Care Management services includes personalized support from designated clinical staff supervised by her physician, including individualized plan of care and coordination with other care providers 24/7 contact phone numbers for assistance for urgent and routine care needs. The patient may stop case management services at any time by phone call to the office staff.  Patient agreed to services and consent obtained.   Assessment: Review of patient past medical history, allergies, medications, health status, including review of consultants reports, laboratory and other test data, was performed as part of comprehensive evaluation and provision of chronic care management services.   SDOH (Social Determinants of Health) assessments and interventions performed:  SDOH Interventions    Flowsheet Row Most Recent Value  SDOH Interventions   Food Insecurity Interventions Intervention Not Indicated  [confirms she has received Mom's Meals and denies ongoing need for resources for food]  Transportation Interventions Intervention Not Indicated  [patient's nephew continues to provide transportation]     Care Plan  Allergies  Allergen Reactions   Cephalexin Palpitations   Ciprofloxacin Palpitations   Other Itching   Meperidine Hcl     Other reaction(s): Other Doesn't work   Sulfa Antibiotics Nausea  And Vomiting   Cefdinir Nausea Only   Doxycycline Nausea Only   Outpatient Encounter Medications as of 02/09/2022  Medication Sig   acetaminophen (TYLENOL) 500 MG tablet Take 500 mg by mouth daily as needed for headache or mild pain.   albuterol (VENTOLIN HFA) 108 (90 Base) MCG/ACT inhaler Inhale 2 puffs into the lungs every 6 (six) hours as needed for wheezing or shortness of breath.   bisacodyl (DULCOLAX) 5 MG EC tablet Take 1 tablet (5 mg total) by mouth daily as needed for moderate constipation.   clonazePAM (KLONOPIN) 0.5 MG tablet Take 1 tablet (0.5 mg total) by mouth 2 (two) times daily.   gabapentin (NEURONTIN) 100 MG capsule Take 3 capsules (300 mg total) by mouth at bedtime.   levocetirizine (XYZAL) 5 MG tablet Take 1 tablet (5 mg total) by mouth daily. (Patient taking differently: Take 5 mg by mouth every evening.)   linaclotide (LINZESS) 72 MCG capsule Take 1 capsule (72 mcg total) by mouth daily before breakfast.   Melatonin 3 MG CAPS Take 1 capsule (3 mg total) by mouth at bedtime.   ondansetron (ZOFRAN) 4 MG tablet Take 1 tablet (4 mg total) by mouth every 6 (six) hours as needed for nausea.   pantoprazole (PROTONIX) 40 MG tablet Take 1 tablet (40 mg total) by mouth 2 (two) times daily. (Patient taking differently: Take 40 mg by mouth daily.)   Polyethyl Glycol-Propyl Glycol (SYSTANE OP) Place 1 drop into both eyes daily as needed (dry eyes).   propranolol ER (INDERAL LA) 60 MG 24 hr capsule Take 1 capsule (60 mg total) by mouth daily.   psyllium (HYDROCIL/METAMUCIL) 95 % PACK Take 1 packet by mouth daily.   rosuvastatin (  CRESTOR) 5 MG tablet Take 1 tablet (5 mg total) by mouth at bedtime.   senna-docusate (SENOKOT-S) 8.6-50 MG tablet Take 1 tablet by mouth 2 (two) times daily.   SYMBICORT 160-4.5 MCG/ACT inhaler Inhale 2 puffs into the lungs 2 (two) times daily.   valACYclovir (VALTREX) 1000 MG tablet Take 1 tablet (1,000 mg total) by mouth daily.   No facility-administered  encounter medications on file as of 02/09/2022.   Patient Active Problem List   Diagnosis Date Noted   CAP (community acquired pneumonia) 11/04/2021   Fall at home, initial encounter 11/04/2021   Abdominal pain 11/04/2021   Constipation 11/04/2021   Renal insufficiency 11/04/2021   Pulmonary nodule 11/04/2021   Dysphagia 09/18/2021   Hypokalemia 08/14/2021   Cognitive change 08/14/2021   Hyponatremia 08/12/2021   Abnormal urine 08/11/2021   Anxiety 02/24/2021   GERD (gastroesophageal reflux disease) 02/24/2021   Hair thinning 12/25/2020   HSV (herpes simplex virus) infection 10/17/2020   Allergic rhinitis 10/17/2020   Hyperlipidemia 10/17/2020   Diarrhea 10/17/2020   Asthma 10/17/2020   Insomnia 10/17/2020   Poor appetite 10/17/2020   Tremor 10/17/2020   Conditions to be addressed/monitored: Asthma and falls  Care Plan : RN Care Manager Plan of Care  Updates made by Knox Royalty, RN since 02/09/2022 12:00 AM     Problem: Chronic Disease Management Needs   Priority: High     Long-Range Goal: Ongoing adherence to established plan of care for long term chronic disease management   Start Date: 12/04/2021  Expected End Date: 12/05/2022  Priority: High  Note:   Current Barriers:  Chronic Disease Management support and education needs related to asthma and frequent falls at home Two recent unplanned inpatient hospitalizations since January 2023; most recent: April 18-25 for UTI sepsis/ CAP; discharged to SNF for rehabilitation [facility]); discharged home from [facility] with home health services in place through [facility]):  Patient will demonstrate ongoing health management independence as evidenced by adherence to plan of care for asthma and frequent falls at home        through collaboration with RN Care manager, provider, and care team.   Interventions: 1:1 collaboration with primary care provider regarding development and update of comprehensive  plan of care as evidenced by provider attestation and co-signature Inter-disciplinary care team collaboration (see longitudinal plan of care) Evaluation of current treatment plan related to  self management and patient's adherence to plan as established by provider 12/04/21- CCM RN CM Initial assessment completed Review of patient status, including review of consultants reports, relevant laboratory and other test results, and medications completed SDOH updated: no new/ unmet concerns identified Pain assessment updated: denies pain today Falls assessment updated: denies new/ recent falls since last reported fall on 12/04/21- continues using walker "all the time;" states got a new walker recently, also got a new life-alert system; positive reinforcement provided with encouragement to continue efforts at fall prevention; previously provided education around fall risks/ prevention reinforced Medications discussed: reports continues to independently self-manage and denies current concerns/ issues/ questions around medications; endorses adherence to taking all medications as prescribed; confirms that her nephew continues assisting with picking up medications as needed Reviewed recent GI provider office visit 01/12/22- she denies post-visit questions and confirms now taking Linzess QD as prescribed- reports "helping diarrhea a little bit" Reviewed upcoming scheduled provider appointments: 03/19/22- GI provider;  05/10/22- PCP; patient confirms is aware of all and has plans to attend as scheduled Discussed plans with  patient for ongoing care management follow up- patient denies current care coordination/ care management needs and is agreeable to CCM RN CM case closure today; verbalizes understanding to contact PCP or other care providers for any needs that arise in the future, and confirms she has contact information for all care providers     Asthma: (Status: 02/09/22: Goal Met.) Long Term Goal  Advised patient to  track and manage Asthma triggers;  Advised patient to self assesses Asthma action plan zone and make appointment with provider if in the yellow zone for 48 hours without improvement; Discussed the importance of adequate rest and management of fatigue with Asthma; Confirmed patient has no current clinical concerns around breathing status; she tells me she has been staying inside during hot weather and has not had any recent flares; she reports "breathing just fine as long as I take my time and stay out of the heat" Confirmed patient understands difference between maintenance inhaler and rescue inhaler- she endorses adherence to using both as prescribed; again reports infrequent use of rescue inhaler  Falls:  (Status: 02/09/22: Goal Met.) Long Term Goal  Provided written and verbal education re: potential causes of falls and Fall prevention strategies Advised patient of importance of notifying provider of falls Assessed for falls since last encounter Assessed patients knowledge of fall risk prevention secondary to previously provided education Assessed working status of life alert bracelet and patient adherence Reports has obtained and is using new life-alert; she also tells me that she has purchased a new walker, "which is much better" than her previous walker Confirms she continues to keep cell phone on her "all the time;" as above, confirms no new/ recent falls since last fall in May 2023     Plan:  No further follow up required: patient denies current care coordination/ care management needs and is agreeable to RN CM case closure today; case closure accordingly     Oneta Rack, RN, BSN, [place] [phone]: direct office

## 2022-02-11 ENCOUNTER — Other Ambulatory Visit (HOSPITAL_COMMUNITY): Payer: Self-pay

## 2022-02-12 ENCOUNTER — Telehealth: Payer: Self-pay | Admitting: Internal Medicine

## 2022-02-12 NOTE — Telephone Encounter (Signed)
Left message on machine to call back  

## 2022-02-12 NOTE — Telephone Encounter (Signed)
Inbound call from patient requesting a call back to discuss Linzess. Patient stated that the medication makes her sick on her stomach and is wanting to see if there is something else she can take. Please advise.

## 2022-02-15 ENCOUNTER — Other Ambulatory Visit (HOSPITAL_COMMUNITY): Payer: Self-pay

## 2022-02-16 NOTE — Telephone Encounter (Signed)
Spoke with the patient and discussed this option. She will consider this. As of this moment she has decided she will remain on the Linzess until she does decide. After we talked this morning, she reports it has been a pretty good day. She has not had any incontinence. She asks if I know who she should see for her hip pain. Recommended having this conversation first with her PCP if she is not established with an Ortho in [place].

## 2022-02-16 NOTE — Telephone Encounter (Signed)
Spoke with the patient. She is taking Linzess every moring when she gets up. She is taking Metamucil at night. Reports she has increased her water consumption. Calls today because she does not feel the Linzess is helping her symptoms. Reports she has incontinence of stool upon standing. She said the stool is liquid and solid. "I can't go anywhere, and I have to change clothes often." She requests a sigmoidoscopy "so I will know what's going on in there." She is interested in trying something different from Linzess.

## 2022-02-18 ENCOUNTER — Ambulatory Visit (INDEPENDENT_AMBULATORY_CARE_PROVIDER_SITE_OTHER): Payer: Medicare Other | Admitting: Psychologist

## 2022-02-18 DIAGNOSIS — F411 Generalized anxiety disorder: Secondary | ICD-10-CM | POA: Diagnosis not present

## 2022-02-18 DIAGNOSIS — F330 Major depressive disorder, recurrent, mild: Secondary | ICD-10-CM | POA: Diagnosis not present
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
## 2022-02-18 NOTE — Progress Notes (Signed)
                Gaston Dase, PsyD 

## 2022-02-18 NOTE — Plan of Care (Signed)

## 2022-03-11 ENCOUNTER — Other Ambulatory Visit (HOSPITAL_COMMUNITY): Payer: Self-pay

## 2022-03-19 ENCOUNTER — Ambulatory Visit: Payer: Medicare Other | Admitting: Internal Medicine

## 2022-03-19 ENCOUNTER — Encounter: Payer: Self-pay | Admitting: Internal Medicine

## 2022-03-19 ENCOUNTER — Other Ambulatory Visit (HOSPITAL_COMMUNITY): Payer: Self-pay

## 2022-03-19 VITALS — BP 120/68 | HR 74 | Ht 59.0 in | Wt 94.2 lb

## 2022-03-19 DIAGNOSIS — R197 Diarrhea, unspecified: Secondary | ICD-10-CM

## 2022-03-19 DIAGNOSIS — K5900 Constipation, unspecified: Secondary | ICD-10-CM | POA: Diagnosis not present

## 2022-03-19 MED ORDER — LINACLOTIDE 145 MCG PO CAPS
145.0000 ug | ORAL_CAPSULE | Freq: Every day | ORAL | 3 refills | Status: DC
  Filled 2022-03-19: qty 30, 30d supply, fill #0
  Filled 2022-04-24: qty 30, 30d supply, fill #1
  Filled 2022-05-31: qty 30, 30d supply, fill #2
  Filled 2022-08-19: qty 30, 30d supply, fill #3

## 2022-03-19 NOTE — Patient Instructions (Signed)
_______________________________________________________  If you are age 86 or older, your body mass index should be between 23-30. Your Body mass index is 19.03 kg/m. If this is out of the aforementioned range listed, please consider follow up with your Primary Care Provider.  If you are age 74 or younger, your body mass index should be between 19-25. Your Body mass index is 19.03 kg/m. If this is out of the aformentioned range listed, please consider follow up with your Primary Care Provider.   Eat a few prunes daily.   We have sent the following medications to your pharmacy for you to pick up at your convenience: Linzess 145 mcg  The [facility] providers would like to encourage you to use [facility] to communicate with providers for non-urgent requests or questions.  Due to long hold times on the telephone, sending your provider a message by [facility] may be a faster and more efficient way to get a response.  Please allow 48 business hours for a response.  Please remember that this is for non-urgent requests.   It was a pleasure to see you today!  Thank you for trusting me with your gastrointestinal care!

## 2022-03-19 NOTE — Progress Notes (Signed)
Chief Complaint: Diarrhea  HPI : 88 year old female with history of asthma, IBS, GERD and peripheral neuropathy presents with diarrhea  Interval History: Patient has continued to have some diarrhea. She does feel like ever since she started the Linzess medication, this has helped with the frequency of her diarrhea. The diarrhea that she is experiencing is still liquid. She does feel like her diarrhea may also be related to food intake. She is limited in how much she is able to physically exercise since she does not like to go out by herself. She has not tried any prunes yet. She is limited in what she is able to eat since she depends on [employer] meals at times so she is not able to select exactly what she consumes. She is trying to drink Ensures and is drinking bottled water.  Wt Readings from Last 3 Encounters:  03/19/22 94 lb 3.2 oz (42.7 kg)  01/12/22 95 lb (43.1 kg)  12/01/21 103 lb (46.7 kg)   Current Outpatient Medications  Medication Sig Dispense Refill   acetaminophen (TYLENOL) 500 MG tablet Take 500 mg by mouth daily as needed for headache or mild pain.     albuterol (VENTOLIN HFA) 108 (90 Base) MCG/ACT inhaler Inhale 2 puffs into the lungs every 6 (six) hours as needed for wheezing or shortness of breath. 8.5 g 2   bisacodyl (DULCOLAX) 5 MG EC tablet Take 1 tablet (5 mg total) by mouth daily as needed for moderate constipation. 30 tablet 1   clonazePAM (KLONOPIN) 0.5 MG tablet Take 1 tablet (0.5 mg total) by mouth 2 (two) times daily. 60 tablet 5   gabapentin (NEURONTIN) 100 MG capsule Take 3 capsules (300 mg total) by mouth at bedtime. 90 capsule 5   levocetirizine (XYZAL) 5 MG tablet Take 1 tablet (5 mg total) by mouth daily. (Patient taking differently: Take 5 mg by mouth every evening.) 90 tablet 3   linaclotide (LINZESS) 72 MCG capsule Take 1 capsule (72 mcg total) by mouth daily before breakfast. 30 capsule 3   Melatonin 3 MG CAPS Take 1 capsule (3 mg total) by mouth at  bedtime.  0   ondansetron (ZOFRAN) 4 MG tablet Take 1 tablet (4 mg total) by mouth every 6 (six) hours as needed for nausea. 20 tablet 0   pantoprazole (PROTONIX) 40 MG tablet Take 1 tablet (40 mg total) by mouth 2 (two) times daily. (Patient taking differently: Take 40 mg by mouth daily.) 180 tablet 3   Polyethyl Glycol-Propyl Glycol (SYSTANE OP) Place 1 drop into both eyes daily as needed (dry eyes).     propranolol ER (INDERAL LA) 60 MG 24 hr capsule Take 1 capsule (60 mg total) by mouth daily. 90 capsule 3   psyllium (HYDROCIL/METAMUCIL) 95 % PACK Take 1 packet by mouth daily. 240 each    rosuvastatin (CRESTOR) 5 MG tablet Take 1 tablet (5 mg total) by mouth at bedtime. 90 tablet 3   SYMBICORT 160-4.5 MCG/ACT inhaler Inhale 2 puffs into the lungs 2 (two) times daily. 10.2 g 11   valACYclovir (VALTREX) 1000 MG tablet Take 1 tablet (1,000 mg total) by mouth daily. 90 tablet 3   senna-docusate (SENOKOT-S) 8.6-50 MG tablet Take 1 tablet by mouth 2 (two) times daily. (Patient not taking: Reported on 03/19/2022)     No current facility-administered medications for this visit.   Physical Exam: BP 120/68   Pulse 74   Ht 4\' 11"  (1.499 m)   Wt 94 lb 3.2  oz (42.7 kg)   BMI 19.03 kg/m  Constitutional: Pleasant,well-developed, female in no acute distress. HEENT: Normocephalic and atraumatic. Conjunctivae are normal. No scleral icterus. Cardiovascular: Normal rate, regular rhythm.  Pulmonary/chest: Effort normal and breath sounds normal. No wheezing, rales or rhonchi. Abdominal: Soft, nondistended, nontender. Bowel sounds active throughout. There are no masses palpable. No hepatomegaly. Extremities: No edema Neurological: Alert and oriented to person place and time. Skin: Skin is warm and dry. No rashes noted. Psychiatric: Normal mood and affect. Behavior is normal.  Labs 07/2021: BMP with low sodium of 132 and elevated glucose of 242. CBC with stable Hb of 12.3.   Labs 10/2021: CBC unremarkable.  CMP with low K of 2.8.   CT A/P w/contrast 07/28/21: IMPRESSION: 1. No acute intra-abdominal or pelvic pathology. 2. Severe sigmoid diverticulosis. No bowel obstruction. 3. Aortic Atherosclerosis (ICD10-I70.0).  CT A/P w/contrast 08/10/21: IMPRESSION: 1. Findings consistent with high-grade mechanical small bowel obstruction with transition point visualized at left abdominal surgical small bowel anastomosis. Negative for intramural air or free air. 2. Patient is status post partial colectomy. Diverticular disease of residual left colon without acute wall thickening 3. Status post cholecystectomy. Intra and extrahepatic biliary dilatation probably due to postsurgical change  CT C/A/P 11/04/21: IMPRESSION: 1. Ill-defined nodular and ground-glass opacities in the right lower lobe and right upper lobe, likely infectious/inflammatory. 2. 13 mm nodular density in the right lower lobe. Consider one of the following in 3 months for both low-risk and high-risk individuals: (a) repeat chest CT, (b) follow-up PET-CT, or (c) tissue sampling. This recommendation follows the consensus statement: Guidelines for Management of Incidental Pulmonary Nodules Detected on CT Images: From the Fleischner Society 2017; Radiology 2017; 284:228-243. 3. No acute localizing process in the abdomen or pelvis. 4. Large amount of stool throughout the colon and rectum. No bowel obstruction. 5. Colonic diverticulosis. 6.  Aortic Atherosclerosis (ICD10-I70.0).  Barium swallow 11/05/21: IMPRESSION: 1. Presbyesophagus 2.  Bilateral pharyngoceles 3.  Stasis in piriform passes with prompted throat clearing. 4.  Esophagus proximally patulous.  KUB 01/12/22: IMPRESSION: No bowel obstruction or free air. Extensive bowel content identified in the colon.  ASSESSMENT AND PLAN:  Overflow diarrhea Weight loss History of SBO Patient has been dealing with overflow diarrhea due to underlying constipation. Her last CT  scan and KUB showed significant stool burden. Patient has had some benefit in her diarrhea from being started on Linzess therapy. Thus will increase her Linzess to see if this helps further with her bowel habits. I also encouraged her to try to eat a few prunes per day. I did offer her a bowel purge or enemas to help with clearing out some of her underlying stool burden, but the patient does not think that she would be able to tolerate these interventions at home since she lives by herself.  - Increase Linzess from 72 mcg to 145 mcg - Continue drinking 8 cups of water per day and aim to increase physical activity - Start eating a few prunes a day - Continue nutritional supplement Ensure 2-3 times pre day - RTC in 3 months  Eulah Pont, MD  I spent 45 minutes of time, including in depth chart review, independent review of results as outlined above, communicating results with the patient directly, face-to-face time with the patient, coordinating care, ordering studies and medications as appropriate, and documentation.

## 2022-03-29 ENCOUNTER — Other Ambulatory Visit (HOSPITAL_COMMUNITY): Payer: Self-pay

## 2022-03-30 ENCOUNTER — Ambulatory Visit (INDEPENDENT_AMBULATORY_CARE_PROVIDER_SITE_OTHER): Payer: Medicare Other | Admitting: Psychologist

## 2022-03-30 DIAGNOSIS — F330 Major depressive disorder, recurrent, mild: Secondary | ICD-10-CM

## 2022-03-30 DIAGNOSIS — F411 Generalized anxiety disorder: Secondary | ICD-10-CM

## 2022-03-30 NOTE — Progress Notes (Signed)
                Marjorie Deprey, PsyD 

## 2022-03-30 NOTE — Progress Notes (Signed)
[facility] Counselor/Therapist Progress Note  Patient ID: Tina Riley, MRN: [id number],    Date: 03/30/2022  Time Spent: 1:05 pm to 1:45 pm; total time: 40 minutes    This session was held via phone teletherapy due to the coronavirus risk at this time. The patient consented to phone teletherapy and was located at her home during this session. She is aware it is the responsibility of the patient to secure confidentiality on her end of the session. The provider was in a private home office for the duration of this session. Limits of confidentiality were discussed with the patient.   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  Mental Status Exam: Appearance:  NA     Behavior: Appropriate  Motor: Normal  Speech/Language:  Clear and Coherent  Affect: Appropriate  Mood: normal  Thought process: normal  Thought content:   WNL  Sensory/Perceptual disturbances:   WNL  Orientation: oriented to person, place, and time/date  Attention: Good  Concentration: Good  Memory: WNL  Fund of knowledge:  Good  Insight:   Fair  Judgment:  Fair  Impulse Control: Good   Risk Assessment: Danger to Self:  No Self-injurious Behavior: No Danger to Others: No Duty to Warn:no Physical Aggression / Violence:No  Access to Firearms a concern: No  Gang Involvement:No   Subjective: Beginning the session, patient described herself as okay. After reviewing the treatment plan, patient spent the session reflecting on feeling isolated. She reflected on ways to decrease feeling isolated from those around her. She was agreeable to homework. She denied suicidal and homicidal ideation.    Interventions:  Worked on developing a therapeutic relationship with the patient using active listening and reflective statements. Provided emotional support using empathy and validation. Reviewed the treatment plan with the patient. Reviewed events since the last  session. Normalized and validated expressed thoughts and emotions. Explored the components associated with depression. Used socratic questions to assist the patient gain insight into self. Identified the theme of social isolation. Explored ways to increase socialization. Assisted in problem solving. Processed thoughts and emotions. Assigned homework. Assessed for suicidal and homicidal ideation.   Homework: Reach out to the [employer]  [place]  Next Session: Review homework and discuss how patient can socialize more  Diagnosis: F33.0 major depressive affective disorder, recurrent, mild and F41.1 generalized anxiety disorder   Plan:   Goals Alleviate depressive symptoms Recognize, accept, and cope with depressive feelings Develop healthy thinking patterns Develop healthy interpersonal relationships Reduce overall frequency, intensity, and duration of anxiety Stabilize anxiety level wile increasing ability to function Enhance ability to effectively cope with full variety of stressors Learn and implement coping skills that result in a reduction of anxiety   Objectives target date for all objectives is 02/19/2023 Verbalize an understanding of the cognitive, physiological, and behavioral components of anxiety Learning and implement calming skills to reduce overall anxiety Verbalize an understanding of the role that cognitive biases play in excessive irrational worry and persistent anxiety symptoms Identify, challenge, and replace based fearful talk Learn and implement problem solving strategies Identify and engage in pleasant activities Learning and implement personal and interpersonal skills to reduce anxiety and improve interpersonal relationships Learn to accept limitations in life and commit to tolerating, rather than avoiding, unpleasant emotions while accomplishing meaningful goals Identify major life conflicts from the past and present that form the basis for present  anxiety Maintain involvement in work, family, and social activities Reestablish a consistent sleep-wake  cycle Cooperate with a medical evaluation  Cooperate with a medication evaluation by a physician Verbalize an accurate understanding of depression Verbalize an understanding of the treatment Identify and replace thoughts that support depression Learn and implement behavioral strategies Verbalize an understanding and resolution of current interpersonal problems Learn and implement problem solving and decision making skills Learn and implement conflict resolution skills to resolve interpersonal problems Verbalize an understanding of healthy and unhealthy emotions verbalize insight into how past relationships may be influence current experiences with depression Use mindfulness and acceptance strategies and increase value based behavior  Increase hopeful statements about the future.  Interventions Engage the patient in behavioral activation Use instruction, modeling, and role-playing to build the client's general social, communication, and/or conflict resolution skills Use Acceptance and Commitment Therapy to help client accept uncomfortable realities in order to accomplish value-consistent goals Reinforce the client's insight into the role of his/her past emotional pain and present anxiety  Support the client in following through with work, family, and social activities Teach and implement sleep hygiene practices  Refer the patient to a physician for a psychotropic medication consultation Monito the clint's psychotropic medication compliance Discuss how anxiety typically involves excessive worry, various bodily expressions of tension, and avoidance of what is threatening that interact to maintain the problem  Teach the patient relaxation skills Assign the patient homework Discuss examples demonstrating that unrealistic worry overestimates the probability of threats and underestimates  patient's ability  Assist the patient in analyzing his or her worries Help patient understand that avoidance is reinforcing  Consistent with treatment model, discuss how change in cognitive, behavioral, and interpersonal can help client alleviate depression CBT Behavioral activation help the client explore the relationship, nature of the dispute,  Help the client develop new interpersonal skills and relationships Conduct Problem solving therapy Teach conflict resolution skills Use a process-experiential approach Conduct TLDP Conduct ACT Evaluate need for psychotropic medication Monitor adherence to medication   The patient and clinician reviewed the treatment plan on 03/30/2022. The patient agreed with the treatment plan.   Hilbert Corrigan, PsyD

## 2022-04-16 ENCOUNTER — Other Ambulatory Visit: Payer: Self-pay | Admitting: Internal Medicine

## 2022-04-19 ENCOUNTER — Other Ambulatory Visit (HOSPITAL_COMMUNITY): Payer: Self-pay

## 2022-04-19 MED ORDER — PANTOPRAZOLE SODIUM 40 MG PO TBEC
40.0000 mg | DELAYED_RELEASE_TABLET | Freq: Two times a day (BID) | ORAL | 3 refills | Status: DC
  Filled 2022-04-19: qty 180, 90d supply, fill #0
  Filled 2022-08-19: qty 180, 90d supply, fill #1
  Filled 2023-03-04: qty 180, 90d supply, fill #2

## 2022-04-20 ENCOUNTER — Ambulatory Visit (INDEPENDENT_AMBULATORY_CARE_PROVIDER_SITE_OTHER): Payer: Medicare Other | Admitting: Psychologist

## 2022-04-20 ENCOUNTER — Other Ambulatory Visit (HOSPITAL_COMMUNITY): Payer: Self-pay

## 2022-04-20 DIAGNOSIS — F411 Generalized anxiety disorder: Secondary | ICD-10-CM

## 2022-04-20 DIAGNOSIS — F330 Major depressive disorder, recurrent, mild: Secondary | ICD-10-CM | POA: Diagnosis not present

## 2022-04-20 NOTE — Progress Notes (Signed)
Erroneous encounter

## 2022-04-20 NOTE — Progress Notes (Signed)
[facility] Counselor/Therapist Progress Note  Patient ID: Tina Riley, MRN: [id number],    Date: 04/20/2022  Time Spent: 02:05 pm to 02:45 pm; total time: 40 minutes    This session was held via phone teletherapy due to the coronavirus risk at this time. The patient consented to phone teletherapy and was located at her home during this session. She is aware it is the responsibility of the patient to secure confidentiality on her end of the session. The provider was in a private home office for the duration of this session. Limits of confidentiality were discussed with the patient.   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  Mental Status Exam: Appearance:  NA     Behavior: Appropriate  Motor: Normal  Speech/Language:  Clear and Coherent  Affect: Appropriate  Mood: normal  Thought process: normal  Thought content:   WNL  Sensory/Perceptual disturbances:   WNL  Orientation: oriented to person, place, and time/date  Attention: Good  Concentration: Good  Memory: WNL  Fund of knowledge:  Good  Insight:   Fair  Judgment:  Fair  Impulse Control: Good   Risk Assessment: Danger to Self:  No Self-injurious Behavior: No Danger to Others: No Duty to Warn:no Physical Aggression / Violence:No  Access to Firearms a concern: No  Gang Involvement:No   Subjective: Beginning the session, patient described herself as okay while reflecting on events since the last session. She described herself as feeling isolated. Per the patient, she perceives her nephew as limiting her ability to do activities. She voiced that she feels as though she has limited options. Per the patient, she would prefer to be in a nursing home if possible. She processed thoughts and emotions. She denied suicidal and homicidal ideation.    Interventions:  Worked on developing a therapeutic relationship with the patient using active listening and  reflective statements. Provided emotional support using empathy and validation. Reviewed the treatment plan with the patient. Used summary and reflective statements. Praised the patient for doing okay and explored what has assisted the patient. Explored whether or not patient had been able to complete the homework assignment. Explored the relationship with nephew. Used socratic questions to assist the patient. Attempted to assist with ways to increase socialization. Explored the idea of living in a nursing facility. Processed thoughts and emotions. Discussed following up. Provided empathic statements. Assessed for suicidal and homicidal ideation.   Homework: NA  Next Session: Emotional support and explore ways to socialize more  Diagnosis: F33.0 major depressive affective disorder, recurrent, mild and F41.1 generalized anxiety disorder   Plan:   Goals Alleviate depressive symptoms Recognize, accept, and cope with depressive feelings Develop healthy thinking patterns Develop healthy interpersonal relationships Reduce overall frequency, intensity, and duration of anxiety Stabilize anxiety level wile increasing ability to function Enhance ability to effectively cope with full variety of stressors Learn and implement coping skills that result in a reduction of anxiety   Objectives target date for all objectives is 02/19/2023 Verbalize an understanding of the cognitive, physiological, and behavioral components of anxiety Learning and implement calming skills to reduce overall anxiety Verbalize an understanding of the role that cognitive biases play in excessive irrational worry and persistent anxiety symptoms Identify, challenge, and replace based fearful talk Learn and implement problem solving strategies Identify and engage in pleasant activities Learning and implement personal and interpersonal skills to reduce anxiety and improve interpersonal relationships Learn to accept limitations in  life and commit to  tolerating, rather than avoiding, unpleasant emotions while accomplishing meaningful goals Identify major life conflicts from the past and present that form the basis for present anxiety Maintain involvement in work, family, and social activities Reestablish a consistent sleep-wake cycle Cooperate with a medical evaluation  Cooperate with a medication evaluation by a physician Verbalize an accurate understanding of depression Verbalize an understanding of the treatment Identify and replace thoughts that support depression Learn and implement behavioral strategies Verbalize an understanding and resolution of current interpersonal problems Learn and implement problem solving and decision making skills Learn and implement conflict resolution skills to resolve interpersonal problems Verbalize an understanding of healthy and unhealthy emotions verbalize insight into how past relationships may be influence current experiences with depression Use mindfulness and acceptance strategies and increase value based behavior  Increase hopeful statements about the future.  Interventions Engage the patient in behavioral activation Use instruction, modeling, and role-playing to build the client's general social, communication, and/or conflict resolution skills Use Acceptance and Commitment Therapy to help client accept uncomfortable realities in order to accomplish value-consistent goals Reinforce the client's insight into the role of his/her past emotional pain and present anxiety  Support the client in following through with work, family, and social activities Teach and implement sleep hygiene practices  Refer the patient to a physician for a psychotropic medication consultation Monito the clint's psychotropic medication compliance Discuss how anxiety typically involves excessive worry, various bodily expressions of tension, and avoidance of what is threatening that interact to maintain  the problem  Teach the patient relaxation skills Assign the patient homework Discuss examples demonstrating that unrealistic worry overestimates the probability of threats and underestimates patient's ability  Assist the patient in analyzing his or her worries Help patient understand that avoidance is reinforcing  Consistent with treatment model, discuss how change in cognitive, behavioral, and interpersonal can help client alleviate depression CBT Behavioral activation help the client explore the relationship, nature of the dispute,  Help the client develop new interpersonal skills and relationships Conduct Problem solving therapy Teach conflict resolution skills Use a process-experiential approach Conduct TLDP Conduct ACT Evaluate need for psychotropic medication Monitor adherence to medication   The patient and clinician reviewed the treatment plan on 03/30/2022. The patient agreed with the treatment plan.   Conception Chancy, PsyD

## 2022-04-20 NOTE — Patient Instructions (Signed)

## 2022-04-21 ENCOUNTER — Ambulatory Visit: Payer: Medicare Other | Admitting: *Deleted

## 2022-04-21 DIAGNOSIS — Z0000 Encounter for general adult medical examination without abnormal findings: Secondary | ICD-10-CM

## 2022-04-26 ENCOUNTER — Ambulatory Visit (INDEPENDENT_AMBULATORY_CARE_PROVIDER_SITE_OTHER): Payer: Medicare Other | Admitting: Psychologist

## 2022-04-26 ENCOUNTER — Other Ambulatory Visit (HOSPITAL_COMMUNITY): Payer: Self-pay

## 2022-04-26 DIAGNOSIS — F411 Generalized anxiety disorder: Secondary | ICD-10-CM

## 2022-04-26 DIAGNOSIS — F330 Major depressive disorder, recurrent, mild: Secondary | ICD-10-CM

## 2022-04-26 NOTE — Progress Notes (Signed)
[facility] Counselor/Therapist Progress Note  Patient ID: Tina Riley, MRN: [id number],    Date: 04/26/2022  Time Spent: 09:03 am to 09:43 am; total time: 40 minutes    This session was held via phone teletherapy due to the coronavirus risk at this time. The patient consented to phone teletherapy and was located at her home during this session. She is aware it is the responsibility of the patient to secure confidentiality on her end of the session. The provider was in a private home office for the duration of this session. Limits of confidentiality were discussed with the patient.   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  Mental Status Exam: Appearance:  NA     Behavior: Appropriate  Motor: Normal  Speech/Language:  Clear and Coherent  Affect: Appropriate  Mood: normal  Thought process: normal  Thought content:   WNL  Sensory/Perceptual disturbances:   WNL  Orientation: oriented to person, place, and time/date  Attention: Good  Concentration: Good  Memory: WNL  Fund of knowledge:  Good  Insight:   Fair  Judgment:  Fair  Impulse Control: Good   Risk Assessment: Danger to Self:  No Self-injurious Behavior: No Danger to Others: No Duty to Warn:no Physical Aggression / Violence:No  Access to Firearms a concern: No  Gang Involvement:No   Subjective: Beginning the session, patient described herself as fair. She reflected on recent events that have occurred in the world that are causing some distress. From there, she talked about how she watches the news due to limitations in transportation to socialize with others. Patient voiced she would rather read a book, however has limited access to books and was apprehensive regarding using a tablet. She processed thoughts and emotions. She denied suicidal and homicidal ideation.    Interventions:  Worked on developing a therapeutic relationship with the patient  using active listening and reflective statements. Provided emotional support using empathy and validation. Reviewed the treatment plan with the patient. Reviewed events since the last session. Normalized and validated expressed thoughts and emotions regarding events occurring around the world. Used socratic questions to assist the patient gain insight into self. Validated experience related to social isolation. Explored what activities patient enjoys. Attempted to assist the patient with having a broader variety of access to [employer]. Processed the idea of living in a retirement community or a nursing home. Validated patient's experience. Provided empathic statements. Assessed for suicidal and homicidal ideation.   Homework: NA  Next Session: NA  Diagnosis: F33.0 major depressive affective disorder, recurrent, mild and F41.1 generalized anxiety disorder   Plan:   Goals Alleviate depressive symptoms Recognize, accept, and cope with depressive feelings Develop healthy thinking patterns Develop healthy interpersonal relationships Reduce overall frequency, intensity, and duration of anxiety Stabilize anxiety level wile increasing ability to function Enhance ability to effectively cope with full variety of stressors Learn and implement coping skills that result in a reduction of anxiety   Objectives target date for all objectives is 02/19/2023 Verbalize an understanding of the cognitive, physiological, and behavioral components of anxiety Learning and implement calming skills to reduce overall anxiety Verbalize an understanding of the role that cognitive biases play in excessive irrational worry and persistent anxiety symptoms Identify, challenge, and replace based fearful talk Learn and implement problem solving strategies Identify and engage in pleasant activities Learning and implement personal and interpersonal skills to reduce anxiety and improve interpersonal relationships Learn to  accept limitations in life and commit  to tolerating, rather than avoiding, unpleasant emotions while accomplishing meaningful goals Identify major life conflicts from the past and present that form the basis for present anxiety Maintain involvement in work, family, and social activities Reestablish a consistent sleep-wake cycle Cooperate with a medical evaluation  Cooperate with a medication evaluation by a physician Verbalize an accurate understanding of depression Verbalize an understanding of the treatment Identify and replace thoughts that support depression Learn and implement behavioral strategies Verbalize an understanding and resolution of current interpersonal problems Learn and implement problem solving and decision making skills Learn and implement conflict resolution skills to resolve interpersonal problems Verbalize an understanding of healthy and unhealthy emotions verbalize insight into how past relationships may be influence current experiences with depression Use mindfulness and acceptance strategies and increase value based behavior  Increase hopeful statements about the future.  Interventions Engage the patient in behavioral activation Use instruction, modeling, and role-playing to build the client's general social, communication, and/or conflict resolution skills Use Acceptance and Commitment Therapy to help client accept uncomfortable realities in order to accomplish value-consistent goals Reinforce the client's insight into the role of his/her past emotional pain and present anxiety  Support the client in following through with work, family, and social activities Teach and implement sleep hygiene practices  Refer the patient to a physician for a psychotropic medication consultation Monito the clint's psychotropic medication compliance Discuss how anxiety typically involves excessive worry, various bodily expressions of tension, and avoidance of what is threatening that  interact to maintain the problem  Teach the patient relaxation skills Assign the patient homework Discuss examples demonstrating that unrealistic worry overestimates the probability of threats and underestimates patient's ability  Assist the patient in analyzing his or her worries Help patient understand that avoidance is reinforcing  Consistent with treatment model, discuss how change in cognitive, behavioral, and interpersonal can help client alleviate depression CBT Behavioral activation help the client explore the relationship, nature of the dispute,  Help the client develop new interpersonal skills and relationships Conduct Problem solving therapy Teach conflict resolution skills Use a process-experiential approach Conduct TLDP Conduct ACT Evaluate need for psychotropic medication Monitor adherence to medication   The patient and clinician reviewed the treatment plan on 03/30/2022. The patient agreed with the treatment plan.   Conception Chancy, PsyD

## 2022-05-10 ENCOUNTER — Ambulatory Visit: Payer: Medicare Other | Admitting: Internal Medicine

## 2022-05-14 ENCOUNTER — Ambulatory Visit (INDEPENDENT_AMBULATORY_CARE_PROVIDER_SITE_OTHER): Payer: Medicare Other | Admitting: Internal Medicine

## 2022-05-14 ENCOUNTER — Encounter: Payer: Self-pay | Admitting: *Deleted

## 2022-05-14 ENCOUNTER — Encounter: Payer: Self-pay | Admitting: Internal Medicine

## 2022-05-14 ENCOUNTER — Ambulatory Visit (INDEPENDENT_AMBULATORY_CARE_PROVIDER_SITE_OTHER): Payer: Medicare Other | Admitting: *Deleted

## 2022-05-14 ENCOUNTER — Other Ambulatory Visit (HOSPITAL_COMMUNITY): Payer: Self-pay

## 2022-05-14 VITALS — BP 122/74 | HR 67 | Temp 97.8°F | Ht 59.0 in | Wt 95.1 lb

## 2022-05-14 VITALS — BP 122/74 | HR 67 | Temp 97.8°F | Ht 59.0 in | Wt 95.0 lb

## 2022-05-14 DIAGNOSIS — K5900 Constipation, unspecified: Secondary | ICD-10-CM | POA: Diagnosis not present

## 2022-05-14 DIAGNOSIS — Z23 Encounter for immunization: Secondary | ICD-10-CM

## 2022-05-14 DIAGNOSIS — M25552 Pain in left hip: Secondary | ICD-10-CM

## 2022-05-14 DIAGNOSIS — Z0000 Encounter for general adult medical examination without abnormal findings: Secondary | ICD-10-CM

## 2022-05-14 MED ORDER — GABAPENTIN 300 MG PO CAPS
300.0000 mg | ORAL_CAPSULE | Freq: Every day | ORAL | 1 refills | Status: DC
  Filled 2022-05-14 – 2022-06-17 (×2): qty 100, 100d supply, fill #0

## 2022-05-14 NOTE — Patient Instructions (Signed)

## 2022-05-14 NOTE — Assessment & Plan Note (Signed)
Flu shot received at [facility]. Pneumonia complete. Shingrix counseled at pharmacy. Tetanus counseled at pharmacy. Colonoscopy aged out. Mammogram aged out, pap smear aged out and dexa complete. Counseled about sun safety and mole surveillance. Counseled about the dangers of distracted driving. Given 10 year screening recommendations.

## 2022-05-14 NOTE — Progress Notes (Signed)
Subjective:   Tina Riley is a 88 y.o. female who presents for Medicare Annual (Subsequent) preventive examination.  Review of Systems    Defer to PCP       Objective:    Today's Vitals   05/14/22 1340 05/14/22 1350  BP: 122/74   Pulse: 67   Temp: 97.8 F (36.6 C)   TempSrc: Oral   SpO2: 93%   Weight: 95 lb 2 oz (43.1 kg)   Height: 4\' 11"  (1.499 m)   PainSc:  8    Body mass index is 19.21 kg/m.     05/14/2022    1:48 PM 11/03/2021   11:12 PM 08/12/2021    2:00 PM 08/12/2021   12:43 AM 08/10/2021    5:51 PM 07/27/2021    5:07 PM  Advanced Directives  Does Patient Have a Medical Advance Directive? Yes No  Yes Yes Yes  Type of Paramedic of [place];Living will   Healthcare Power of [place];Living will [place];Living will  Does patient want to make changes to medical advance directive?   No - Patient declined     Copy of [place] in Chart? No - copy requested   No - copy requested    Would patient like information on creating a medical advance directive?  Yes (ED - Information included in AVS)        Current Medications (verified) Outpatient Encounter Medications as of 05/14/2022  Medication Sig   acetaminophen (TYLENOL) 500 MG tablet Take 500 mg by mouth daily as needed for headache or mild pain.   albuterol (VENTOLIN HFA) 108 (90 Base) MCG/ACT inhaler Inhale 2 puffs into the lungs every 6 (six) hours as needed for wheezing or shortness of breath.   clonazePAM (KLONOPIN) 0.5 MG tablet Take 1 tablet (0.5 mg total) by mouth 2 (two) times daily.   gabapentin (NEURONTIN) 100 MG capsule Take 3 capsules (300 mg total) by mouth at bedtime.   levocetirizine (XYZAL) 5 MG tablet Take 1 tablet (5 mg total) by mouth daily. (Patient taking differently: Take 5 mg by mouth every evening.)   linaclotide (LINZESS) 145 MCG CAPS capsule Take 1 capsule (145 mcg total) by mouth daily before  breakfast.   Melatonin 3 MG CAPS Take 1 capsule (3 mg total) by mouth at bedtime.   ondansetron (ZOFRAN) 4 MG tablet Take 1 tablet (4 mg total) by mouth every 6 (six) hours as needed for nausea.   pantoprazole (PROTONIX) 40 MG tablet Take 1 tablet (40 mg total) by mouth 2 (two) times daily.   Polyethyl Glycol-Propyl Glycol (SYSTANE OP) Place 1 drop into both eyes daily as needed (dry eyes).   propranolol ER (INDERAL LA) 60 MG 24 hr capsule Take 1 capsule (60 mg total) by mouth daily.   psyllium (HYDROCIL/METAMUCIL) 95 % PACK Take 1 packet by mouth daily.   rosuvastatin (CRESTOR) 5 MG tablet Take 1 tablet (5 mg total) by mouth at bedtime.   SYMBICORT 160-4.5 MCG/ACT inhaler Inhale 2 puffs into the lungs 2 (two) times daily.   valACYclovir (VALTREX) 1000 MG tablet Take 1 tablet (1,000 mg total) by mouth daily.   [DISCONTINUED] bisacodyl (DULCOLAX) 5 MG EC tablet Take 1 tablet (5 mg total) by mouth daily as needed for moderate constipation.   [DISCONTINUED] senna-docusate (SENOKOT-S) 8.6-50 MG tablet Take 1 tablet by mouth 2 (two) times daily. (Patient not taking: Reported on 03/19/2022)   No facility-administered encounter medications  on file as of 05/14/2022.    Allergies (verified) Cephalexin, Ciprofloxacin, Other, Meperidine hcl, Sulfa antibiotics, Cefdinir, and Doxycycline   History: Past Medical History:  Diagnosis Date   Arthritis    Asthma    Chicken pox    Depression    Diverticulitis    Genital warts    GERD (gastroesophageal reflux disease)    Hay fever    History of frequent urinary tract infections    Hypertension    IBS (irritable bowel syndrome)    Lactose intolerance    Peripheral neuropathy    Urinary incontinence    Past Surgical History:  Procedure Laterality Date   ABDOMINAL HYSTERECTOMY     Hip Replacement  2020   PARTIAL COLECTOMY  1980s   TONSILLECTOMY AND ADENOIDECTOMY  childhood   vocal cord tumor removal  2012   Family History  Problem Relation Age  of Onset   Arthritis Mother    COPD Mother    Heart attack Father    Heart disease Father    Hypertension Father    Hyperlipidemia Father    Stroke Father    Stroke Sister    Hypertension Sister    Hyperlipidemia Sister    Hearing loss Sister    Cancer Sister    Arthritis Sister    Alcohol abuse Son    Diabetes Son    Drug abuse Son    Heart attack Son    Hypertension Son    Social History   Socioeconomic History   Marital status: Widowed    Spouse name: Not on file   Number of children: Not on file   Years of education: Not on file   Highest education level: Not on file  Occupational History   Not on file  Tobacco Use   Smoking status: Never   Smokeless tobacco: Never  Vaping Use   Vaping Use: Never used  Substance and Sexual Activity   Alcohol use: Never   Drug use: Never   Sexual activity: Not Currently  Other Topics Concern   Not on file  Social History Narrative   She is widowed and reports having 1 son   Her nephew Marthann SchillerMitch helps   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   Never smoker no alcohol 1 caffeinated beverage daily no drug use   She lives independently in [employer] [place] apartment   Social Determinants of Health   Financial Resource Strain: Low Risk  (05/14/2022)   Overall Financial Resource Strain (CARDIA)    Difficulty of Paying Living Expenses: Not hard at all  Food Insecurity: No Food Insecurity (05/14/2022)   Hunger Vital Sign    Worried About Running Out of Food in the Last Year: Never true    Ran Out of Food in the Last Year: Never true  Transportation Needs: No Transportation Needs (05/14/2022)   PRAPARE - Administrator, Civil ServiceTransportation    Lack of Transportation (Medical): No    Lack of Transportation (Non-Medical): No  Physical Activity: Inactive (05/14/2022)   Exercise Vital Sign    Days of Exercise per Week: 0 days    Minutes of Exercise per Session: 0 min  Stress: No Stress Concern Present (05/14/2022)   [employer] - Occupational Stress Questionnaire    Feeling of Stress : Only a little  Social Connections: Moderately Isolated (05/14/2022)   Social Connection and Isolation Panel [NHANES]    Frequency of Communication with Friends and Family: More than three times a week  Frequency of Social Gatherings with Friends and Family: Never    Attends Religious Services: 1 to 4 times per year    Active Member of [employer] or Organizations: No    Attends Banker Meetings: Never    Marital Status: Widowed    Tobacco Counseling Counseling given: Not Answered   Clinical Intake:  Pre-visit preparation completed: Yes  Pain : 0-10 Pain Score: 8  Pain Type: Chronic pain Pain Location: Hip Pain Orientation: Left Pain Descriptors / Indicators: Dull, Burning Pain Onset: More than a month ago Pain Frequency: Occasional Pain Relieving Factors: Patient take tylenol  Pain Relieving Factors: Patient take tylenol  Nutritional Status: BMI of 19-24  Normal Diabetes: No  How often do you need to have someone help you when you read instructions, pamphlets, or other written materials from your doctor or pharmacy?: 1 - Never What is the last grade level you completed in school?: 12th grade  Diabetic?no  Interpreter Needed?: No      Activities of Daily Living    05/14/2022    2:02 PM 05/14/2022    2:00 PM  In your present state of health, do you have any difficulty performing the following activities:  Hearing?  0  Vision?  1  Difficulty concentrating or making decisions?  0  Walking or climbing stairs?  0  Dressing or bathing?  0  Doing errands, shopping?  0  Preparing Food and eating ? N   Using the Toilet? N   In the past six months, have you accidently leaked urine? N   Do you have problems with loss of bowel control? N   Managing your Medications? N   Managing your Finances? N   Housekeeping or managing your Housekeeping? Y   Comment patient has  someone to come in to clean     Patient Care Team: Myrlene Broker, MD as PCP - General (Internal Medicine)  Indicate any recent Medical Services you may have received from other than [facility] providers in the past year (date may be approximate).     Assessment:   This is a routine wellness examination for [place].  Hearing/Vision screen No results found.  Dietary issues and exercise activities discussed: Current Exercise Habits: Home exercise routine, Type of exercise: walking, Time (Minutes): 20, Frequency (Times/Week): 3, Weekly Exercise (Minutes/Week): 60, Intensity: Mild, Exercise limited by: None identified   Goals Addressed             This Visit's Progress    Patient Stated       Patient states being healthy       Depression Screen    05/14/2022    1:58 PM 05/14/2022    1:48 PM 12/01/2021    4:04 PM 11/02/2021    3:06 PM 10/15/2020   10:09 AM  PHQ 2/9 Scores  PHQ - 2 Score 0 0 3 0 0  PHQ- 9 Score   12      Fall Risk    05/14/2022    1:47 PM 02/09/2022    2:15 PM 01/05/2022    2:15 PM 12/04/2021   11:15 AM 12/01/2021    4:04 PM  Fall Risk   Falls in the past year? 1 1  1 1   Comment  denies new/ recent falls since May 2023; continues using walker "all the time" also has new life alert system denies new/ recent falls since last outreach 12/04/21; continues using walker all the time    Number falls in past yr:  1 1  1  0  Injury with Fall? 1 1  1 1   Risk for fall due to : Impaired balance/gait Impaired mobility;History of fall(s);Medication side effect Impaired mobility;History of fall(s);Medication side effect;Other (Comment) Impaired mobility;Impaired balance/gait;History of fall(s);Medication side effect;Other (Comment)   Risk for fall due to: Comment   neuropathy in feet/ LE bilaterally bilateral neuropathy in lower legs and feet   Follow up Falls evaluation completed Falls prevention discussed Falls prevention discussed Falls prevention discussed     FALL  RISK PREVENTION PERTAINING TO THE HOME:  Any stairs in or around the home? No  If so, are there any without handrails? No  Home free of loose throw rugs in walkways, pet beds, electrical cords, etc? Yes  Adequate lighting in your home to reduce risk of falls? Yes   ASSISTIVE DEVICES UTILIZED TO PREVENT FALLS:  Life alert? Yes  Use of a cane, walker or w/c? Yes  Grab bars in the bathroom? Yes  Shower chair or bench in shower? Yes  Elevated toilet seat or a handicapped toilet? Yes   TIMED UP AND GO:  Was the test performed? Yes .  Length of time to ambulate 10 feet: n/a sec.   Gait slow and steady with assistive device  Cognitive Function:        05/14/2022    2:00 PM  6CIT Screen  What Year? 0 points  What month? 0 points  What time? 0 points  Count back from 20 2 points  Months in reverse 4 points  Repeat phrase 4 points  Total Score 10 points    Immunizations Immunization History  Administered Date(s) Administered   Fluad Quad(high Dose 65+) 04/09/2020, 05/04/2021   Influenza Split 05/13/2011, 04/15/2012   Influenza, High Dose Seasonal PF 05/12/2017, 04/18/2018, 04/03/2019, 04/09/2020   Influenza, Seasonal, Injecte, Preservative Fre 04/18/2014, 05/07/2015   Influenza-Unspecified 05/03/2018   PFIZER(Purple Top)SARS-COV-2 Vaccination 09/13/2019, 10/04/2019, 06/11/2020   Pfizer Covid-19 Vaccine Bivalent Booster 87yrs & up 07/08/2021   Pneumococcal Conjugate-13 12/30/2016   Pneumococcal Polysaccharide-23 07/19/2009   Tdap 10/10/2018    TDAP status: Up to date  Flu Vaccine status: Completed at today's visit  Pneumococcal vaccine status: Up to date  Covid-19 vaccine status: Information provided on how to obtain vaccines.   Qualifies for Shingles Vaccine? Yes   Zostavax completed No   Shingrix Completed?: No.    Education has been provided regarding the importance of this vaccine. Patient has been advised to call insurance company to determine out of pocket  expense if they have not yet received this vaccine. Advised may also receive vaccine at local pharmacy or Health Dept. Verbalized acceptance and understanding.  Screening Tests Health Maintenance  Topic Date Due   DEXA SCAN  Never done   INFLUENZA VACCINE  10/17/2022 (Originally 02/16/2022)   COVID-19 Vaccine (5 - Pfizer risk series) 11/13/2022 (Originally 09/02/2021)   Zoster Vaccines- Shingrix (1 of 2) 11/13/2022 (Originally 08/02/1950)   MAMMOGRAM  05/15/2023 (Originally 03/23/2018)   Medicare Annual Wellness (AWV)  05/15/2023   TETANUS/TDAP  10/09/2028   Pneumonia Vaccine 37+ Years old  Completed   HPV VACCINES  Aged Out    Health Maintenance  Health Maintenance Due  Topic Date Due   DEXA SCAN  Never done    Colorectal cancer screening: No longer required.   Mammogram status: No longer required due to aged.    Lung Cancer Screening: (Low Dose CT Chest recommended if Age 36-80 years, 30 pack-year currently smoking OR have quit  w/in 15years.) does not qualify.   Lung Cancer Screening Referral: n/a  Additional Screening:  Hepatitis C Screening: does not qualify; Completed n/a  Vision Screening: Recommended annual ophthalmology exams for early detection of glaucoma and other disorders of the eye. Is the patient up to date with their annual eye exam?  Yes  Who is the provider or what is the name of the office in which the patient attends annual eye exams? Dr.Rhonda Thummond If pt is not established with a provider, would they like to be referred to a provider to establish care? No .   Dental Screening: Recommended annual dental exams for proper oral hygiene  Community Resource Referral / Chronic Care Management: CRR required this visit?  No   CCM required this visit?  No      Plan:     I have personally reviewed and noted the following in the patient's chart:   Medical and social history Use of alcohol, tobacco or illicit drugs  Current medications and supplements  including opioid prescriptions. Patient is not currently taking opioid prescriptions. Functional ability and status Nutritional status Physical activity Advanced directives List of other physicians Hospitalizations, surgeries, and ER visits in previous 12 months Vitals Screenings to include cognitive, depression, and falls Referrals and appointments  In addition, I have reviewed and discussed with patient certain preventive protocols, quality metrics, and best practice recommendations. A written personalized care plan for preventive services as well as general preventive health recommendations were provided to patient.     Tamela Oddi, CMA   05/14/2022   Nurse Notes:  Ms. Kumagai , Thank you for taking time to come for your Medicare Wellness Visit. I appreciate your ongoing commitment to your health goals. Please review the following plan we discussed and let me know if I can assist you in the future.   These are the goals we discussed:  Goals      Patient Stated     Patient states being healthy         This is a list of the screening recommended for you and due dates:  Health Maintenance  Topic Date Due   DEXA scan (bone density measurement)  Never done   Flu Shot  10/17/2022*   COVID-19 Vaccine (5 - Pfizer risk series) 11/13/2022*   Zoster (Shingles) Vaccine (1 of 2) 11/13/2022*   Mammogram  05/15/2023*   Medicare Annual Wellness Visit  05/15/2023   Tetanus Vaccine  10/09/2028   Pneumonia Vaccine  Completed   HPV Vaccine  Aged Out  *Topic was postponed. The date shown is not the original due date.

## 2022-05-14 NOTE — Patient Instructions (Signed)
We have sent in the gabapentin 300 mg capsules to take in the evening.

## 2022-05-14 NOTE — Assessment & Plan Note (Signed)
Prior hip surgery and has needed injection in the hip previously. Referral to sports medicine to help with the pain. We have also increased gabapentin to 300 mg qhs to help with pain.

## 2022-05-14 NOTE — Progress Notes (Signed)
   Subjective:   Patient ID: Tina Riley, female    DOB: 1931-09-04, 88 y.o.   MRN: [id number]  HPI The patient is here for physical with concerns. Dr. Prudencio Burly if needed  PMH, [facility], social history reviewed and updated  Review of Systems  Constitutional:  Positive for activity change and appetite change.  HENT: Negative.    Eyes: Negative.   Respiratory:  Negative for cough, chest tightness and shortness of breath.   Cardiovascular:  Negative for chest pain, palpitations and leg swelling.  Gastrointestinal:  Negative for abdominal distention, abdominal pain, constipation, diarrhea, nausea and vomiting.  Musculoskeletal: Negative.   Skin: Negative.   Neurological: Negative.   Psychiatric/Behavioral:  Positive for sleep disturbance. The patient is nervous/anxious.     Objective:  Physical Exam Constitutional:      Appearance: She is well-developed. She is ill-appearing.  HENT:     Head: Normocephalic and atraumatic.  Cardiovascular:     Rate and Rhythm: Normal rate and regular rhythm.  Pulmonary:     Effort: Pulmonary effort is normal. No respiratory distress.     Breath sounds: Normal breath sounds. No wheezing or rales.  Abdominal:     General: Bowel sounds are normal. There is no distension.     Palpations: Abdomen is soft.     Tenderness: There is no abdominal tenderness. There is no rebound.  Musculoskeletal:     Cervical back: Normal range of motion.  Skin:    General: Skin is warm and dry.  Neurological:     Mental Status: She is alert and oriented to person, place, and time.     Coordination: Coordination abnormal.     Vitals:   05/14/22 1517  BP: 122/74  Pulse: 67  Temp: 97.8 F (36.6 C)  TempSrc: Oral  SpO2: 93%  Weight: 95 lb (43.1 kg)  Height: 4\' 11"  (1.499 m)    Assessment & Plan:

## 2022-05-14 NOTE — Assessment & Plan Note (Signed)
Doing better with linzess, diet changes, prunes. Is having less to no colon attacks with diarrhea.

## 2022-05-17 ENCOUNTER — Ambulatory Visit: Payer: Medicare Other | Admitting: Family Medicine

## 2022-05-17 ENCOUNTER — Other Ambulatory Visit (HOSPITAL_COMMUNITY): Payer: Self-pay

## 2022-05-17 ENCOUNTER — Ambulatory Visit (INDEPENDENT_AMBULATORY_CARE_PROVIDER_SITE_OTHER): Payer: Medicare Other

## 2022-05-17 ENCOUNTER — Ambulatory Visit: Payer: Self-pay

## 2022-05-17 VITALS — Ht 59.0 in

## 2022-05-17 DIAGNOSIS — E440 Moderate protein-calorie malnutrition: Secondary | ICD-10-CM | POA: Diagnosis not present

## 2022-05-17 DIAGNOSIS — M7062 Trochanteric bursitis, left hip: Secondary | ICD-10-CM

## 2022-05-17 DIAGNOSIS — M25552 Pain in left hip: Secondary | ICD-10-CM | POA: Diagnosis not present

## 2022-05-17 DIAGNOSIS — E46 Unspecified protein-calorie malnutrition: Secondary | ICD-10-CM | POA: Insufficient documentation

## 2022-05-17 NOTE — Progress Notes (Signed)
I, Peterson Lombard, LAT, ATC acting as a scribe for Lynne Leader, MD.  Subjective:    CC: L hip pain  HPI: Patient is an 88 year old female complaining of left hip pain. 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 Pt has suffered several times since this surgery, landing on her L hip. Pt locates pain to the lateral aspect of the L hip.  Her most recent fall was several months ago.  She has been walking on her hip since with no severe increase in pain.  She does not drive and is effectively homebound relying on her nephew for transportation.  Radiating pain: yes- lateral aspect of the L thigh LE weakness: yes Aggravates: walking Treatments tried: Gabapentin, Tylenol  Dx imaging: 11/03/21 Pelvis XR  Pertinent review of Systems: No fevers or chills  Relevant historical information: Renal insufficiency.   Objective:   BP 122/74 in wheelchair did not weigh. Last weight was 95 lbs.  General: Elder a frail woman seated in wheelchair.  Decreased muscle mass.  MSK: Left hip decreased muscle bulk.  Tender palpation at greater trochanter.  Hip abduction strength 4/5 external rotation strength 4/5.  Both reproduce pain. She is able to stand from a seated position independently and walk with an antalgic gait to the exam table.  Lab and Radiology Results  Procedure: Real-time Ultrasound Guided Injection of left lateral hip greater trochanter bursa Device: Philips Affiniti 50G Images permanently stored and available for review in PACS Verbal informed consent obtained.  Discussed risks and benefits of procedure. Warned about infection, bleeding, hyperglycemia damage to structures among others. Patient expresses understanding and agreement Time-out conducted.   Noted no overlying erythema, induration, or other signs of local infection.   Skin prepped in a sterile fashion.   Local anesthesia: Topical Ethyl chloride.   With sterile technique and under real time ultrasound guidance: 40  mg of Kenalog and 2 mL of Marcaine injected into greater trochanter bursa. Fluid seen entering the bursa.   Completed without difficulty   Pain immediately resolved suggesting accurate placement of the medication.   Advised to call if fevers/chills, erythema, induration, drainage, or persistent bleeding.   Images permanently stored and available for review in the ultrasound unit.  Impression: Technically successful ultrasound guided injection.  X-ray images left hip obtained today personally and independently interpreted Left hemiarthroplasty.  No acute fracture is visible.  The hardware looks intact. Await formal radiology review     Impression and Recommendations:    Assessment and Plan: 88 y.o. female with left lateral hip pain due to greater trochanteric bursitis and weakness of the hip abductor muscles and tendons.  Plan for injection today and referral to home health physical therapy.  We will recheck in about 6 weeks.  She appears to have decreased muscle bulk as well and I am concerned for protein calorie malnutrition.  This likely is secondary to her age and general frailty. While working on strengthening with physical therapy recommend increased protein intake.  PDMP not reviewed this encounter. Orders Placed This Encounter  Procedures   [place] LIMITED JOINT SPACE STRUCTURES LOW LEFT(NO LINKED CHARGES)    Order Specific Question:   Reason for Exam (SYMPTOM  OR DIAGNOSIS REQUIRED)    Answer:   greater troch inj    Order Specific Question:   Preferred imaging location?    Answer:   [facility]   DG HIP UNILAT W OR W/O PELVIS 2-3 VIEWS LEFT    Standing Status:  Future    Number of Occurrences:   1    Standing Expiration Date:   06/17/2022    Order Specific Question:   Reason for Exam (SYMPTOM  OR DIAGNOSIS REQUIRED)    Answer:   left hip pain    Order Specific Question:   Preferred imaging location?    Answer:   [facility]   No orders of  the defined types were placed in this encounter.   Discussed warning signs or symptoms. Please see discharge instructions. Patient expresses understanding.   The above documentation has been reviewed and is accurate and complete Clementeen Graham, M.D.

## 2022-05-17 NOTE — Patient Instructions (Addendum)
Thank you for coming in today.   Please get an Xray today before you leave   You received an injection today. Seek immediate medical attention if the joint becomes red, extremely painful, or is oozing fluid.   Plan for home health.   Recheck in 6 weeks.

## 2022-05-20 NOTE — Progress Notes (Signed)
Left hip x-ray shows good-looking hip replacement.  No fractures are visible.

## 2022-05-28 ENCOUNTER — Other Ambulatory Visit (HOSPITAL_COMMUNITY): Payer: Self-pay

## 2022-05-31 ENCOUNTER — Other Ambulatory Visit (HOSPITAL_COMMUNITY): Payer: Self-pay

## 2022-06-01 ENCOUNTER — Other Ambulatory Visit (HOSPITAL_COMMUNITY): Payer: Self-pay

## 2022-06-14 ENCOUNTER — Telehealth: Payer: Self-pay | Admitting: Internal Medicine

## 2022-06-14 NOTE — Telephone Encounter (Signed)
Inbound call from patient states she is returning Dr Derek Mound call. Please advise

## 2022-06-16 NOTE — Telephone Encounter (Signed)
Talked with the patient for about 15 minutes. Very nice lady.

## 2022-06-16 NOTE — Telephone Encounter (Signed)
She wanted to explain why she had to reschedule. July 13, 2020 is the birthday of a deceased son. She wanted to stay home.

## 2022-06-17 ENCOUNTER — Ambulatory Visit: Payer: Medicare Other | Admitting: Internal Medicine

## 2022-06-17 ENCOUNTER — Other Ambulatory Visit (HOSPITAL_COMMUNITY): Payer: Self-pay

## 2022-06-25 ENCOUNTER — Inpatient Hospital Stay (HOSPITAL_COMMUNITY)
Admission: EM | Admit: 2022-06-25 | Discharge: 2022-07-01 | DRG: 690 | Disposition: A | Discharge status: Skilled Nursing Facility | Payer: Medicare Other | Attending: Internal Medicine | Admitting: Internal Medicine

## 2022-06-25 ENCOUNTER — Other Ambulatory Visit: Payer: Self-pay

## 2022-06-25 DIAGNOSIS — J301 Allergic rhinitis due to pollen: Secondary | ICD-10-CM | POA: Diagnosis not present

## 2022-06-25 DIAGNOSIS — Z79899 Other long term (current) drug therapy: Secondary | ICD-10-CM

## 2022-06-25 DIAGNOSIS — Z8261 Family history of arthritis: Secondary | ICD-10-CM | POA: Diagnosis not present

## 2022-06-25 DIAGNOSIS — Z66 Do not resuscitate: Secondary | ICD-10-CM | POA: Diagnosis not present

## 2022-06-25 DIAGNOSIS — R519 Headache, unspecified: Secondary | ICD-10-CM | POA: Diagnosis not present

## 2022-06-25 DIAGNOSIS — J4489 Other specified chronic obstructive pulmonary disease: Secondary | ICD-10-CM | POA: Diagnosis present

## 2022-06-25 DIAGNOSIS — R300 Dysuria: Secondary | ICD-10-CM | POA: Diagnosis not present

## 2022-06-25 DIAGNOSIS — Z8249 Family history of ischemic heart disease and other diseases of the circulatory system: Secondary | ICD-10-CM | POA: Diagnosis not present

## 2022-06-25 DIAGNOSIS — I491 Atrial premature depolarization: Secondary | ICD-10-CM | POA: Diagnosis not present

## 2022-06-25 DIAGNOSIS — I10 Essential (primary) hypertension: Secondary | ICD-10-CM | POA: Diagnosis not present

## 2022-06-25 DIAGNOSIS — R531 Weakness: Secondary | ICD-10-CM | POA: Diagnosis not present

## 2022-06-25 DIAGNOSIS — K219 Gastro-esophageal reflux disease without esophagitis: Secondary | ICD-10-CM | POA: Diagnosis not present

## 2022-06-25 DIAGNOSIS — Z825 Family history of asthma and other chronic lower respiratory diseases: Secondary | ICD-10-CM

## 2022-06-25 DIAGNOSIS — W1830XA Fall on same level, unspecified, initial encounter: Secondary | ICD-10-CM | POA: Diagnosis present

## 2022-06-25 DIAGNOSIS — G629 Polyneuropathy, unspecified: Secondary | ICD-10-CM | POA: Diagnosis present

## 2022-06-25 DIAGNOSIS — Z87440 Personal history of urinary (tract) infections: Secondary | ICD-10-CM

## 2022-06-25 DIAGNOSIS — Z96649 Presence of unspecified artificial hip joint: Secondary | ICD-10-CM | POA: Diagnosis not present

## 2022-06-25 DIAGNOSIS — Z8619 Personal history of other infectious and parasitic diseases: Secondary | ICD-10-CM

## 2022-06-25 DIAGNOSIS — W19XXXA Unspecified fall, initial encounter: Secondary | ICD-10-CM | POA: Diagnosis not present

## 2022-06-25 DIAGNOSIS — Z881 Allergy status to other antibiotic agents status: Secondary | ICD-10-CM | POA: Diagnosis not present

## 2022-06-25 DIAGNOSIS — Z90710 Acquired absence of both cervix and uterus: Secondary | ICD-10-CM

## 2022-06-25 DIAGNOSIS — K589 Irritable bowel syndrome without diarrhea: Secondary | ICD-10-CM | POA: Diagnosis not present

## 2022-06-25 DIAGNOSIS — N12 Tubulo-interstitial nephritis, not specified as acute or chronic: Secondary | ICD-10-CM | POA: Diagnosis not present

## 2022-06-25 DIAGNOSIS — N179 Acute kidney failure, unspecified: Secondary | ICD-10-CM | POA: Diagnosis present

## 2022-06-25 DIAGNOSIS — N390 Urinary tract infection, site not specified: Secondary | ICD-10-CM | POA: Diagnosis present

## 2022-06-25 DIAGNOSIS — E739 Lactose intolerance, unspecified: Secondary | ICD-10-CM | POA: Diagnosis not present

## 2022-06-25 DIAGNOSIS — M1990 Unspecified osteoarthritis, unspecified site: Secondary | ICD-10-CM | POA: Diagnosis present

## 2022-06-25 DIAGNOSIS — Z885 Allergy status to narcotic agent status: Secondary | ICD-10-CM | POA: Diagnosis not present

## 2022-06-25 DIAGNOSIS — B9620 Unspecified Escherichia coli [E. coli] as the cause of diseases classified elsewhere: Secondary | ICD-10-CM | POA: Diagnosis present

## 2022-06-25 DIAGNOSIS — Y92009 Unspecified place in unspecified non-institutional (private) residence as the place of occurrence of the external cause: Secondary | ICD-10-CM

## 2022-06-25 DIAGNOSIS — E860 Dehydration: Secondary | ICD-10-CM | POA: Diagnosis not present

## 2022-06-25 DIAGNOSIS — Y9301 Activity, walking, marching and hiking: Secondary | ICD-10-CM | POA: Diagnosis present

## 2022-06-25 DIAGNOSIS — Z7951 Long term (current) use of inhaled steroids: Secondary | ICD-10-CM

## 2022-06-25 DIAGNOSIS — F32A Depression, unspecified: Secondary | ICD-10-CM | POA: Diagnosis not present

## 2022-06-25 DIAGNOSIS — N3000 Acute cystitis without hematuria: Secondary | ICD-10-CM | POA: Diagnosis not present

## 2022-06-25 DIAGNOSIS — Z882 Allergy status to sulfonamides status: Secondary | ICD-10-CM

## 2022-06-25 LAB — URINALYSIS, ROUTINE W REFLEX MICROSCOPIC
Bilirubin Urine: NEGATIVE
Glucose, UA: NEGATIVE mg/dL
Ketones, ur: 20 mg/dL — AB
Nitrite: NEGATIVE
Protein, ur: 100 mg/dL — AB
Specific Gravity, Urine: 1.013 (ref 1.005–1.030)
WBC, UA: >50 WBC/hpf — ABNORMAL HIGH (ref 0–5)
pH: 5 (ref 5.0–8.0)

## 2022-06-25 LAB — CBC
HCT: 37.2 % (ref 36.0–46.0)
Hemoglobin: 12.7 g/dL (ref 12.0–15.0)
MCH: 36.9 pg — ABNORMAL HIGH (ref 26.0–34.0)
MCHC: 34.1 g/dL (ref 30.0–36.0)
MCV: 108.1 fL — ABNORMAL HIGH (ref 80.0–100.0)
Platelets: 180 10*3/uL (ref 150–400)
RBC: 3.44 MIL/uL — ABNORMAL LOW (ref 3.87–5.11)
RDW: 13.8 % (ref 11.5–15.5)
WBC: 16.1 10*3/uL — ABNORMAL HIGH (ref 4.0–10.5)
nRBC: 0 % (ref 0.0–0.2)

## 2022-06-25 NOTE — ED Provider Triage Note (Signed)
Emergency Medicine Provider Triage Evaluation Note  Tina Riley , a 88 y.o. female  was evaluated in triage.  Pt complains of dysuria.  Patient brought in by EMS who states that earlier today EMS was called to the home for fall.  EMS helped the patient to her feet and then left.  The patient's son then came to the house later that day and noted the patient to be altered and he called EMS back out of the scene.  On my examination the patient is alert and oriented x 4.  The patient is stating that it feels like she has to urinate however is unable to do so.  Patient denies any overt dysuria, flank pain.  Patient does endorse 1 episode of nausea without vomiting earlier today.  Patient denies any fevers.  Review of Systems  Positive:  Negative:   Physical Exam  BP (!) 160/64   Pulse 84   Temp 100.1 F (37.8 C)   Resp (!) 22   SpO2 96%  Gen:   Awake, no distress   Resp:  Normal effort  MSK:   Moves extremities without difficulty  Other:    Medical Decision Making  Medically screening exam initiated at 9:41 PM.  Appropriate orders placed.  Elberta Spaniel Tuberville was informed that the remainder of the evaluation will be completed by another provider, this initial triage assessment does not replace that evaluation, and the importance of remaining in the ED until their evaluation is complete.     Al Decant, PA-C 06/25/22 2142

## 2022-06-25 NOTE — ED Triage Notes (Signed)
BIB EMS from home.  Called out earlier after a fall and EMS came out and picked her up but did not transport.  Son came tonight and she was not able to ambulate and seemed altered.  Pt states she has decreased urine output.  Dark urine.  Foul smelling.  Hx of UTIs.   Fall described as getting weak and sitting down on her bottom.  126/78, CBG 122  24G LW

## 2022-06-26 ENCOUNTER — Observation Stay (HOSPITAL_COMMUNITY): Payer: Medicare Other

## 2022-06-26 ENCOUNTER — Encounter (HOSPITAL_COMMUNITY): Payer: Self-pay | Admitting: Internal Medicine

## 2022-06-26 ENCOUNTER — Emergency Department (HOSPITAL_COMMUNITY): Payer: Medicare Other

## 2022-06-26 DIAGNOSIS — N179 Acute kidney failure, unspecified: Secondary | ICD-10-CM | POA: Diagnosis present

## 2022-06-26 DIAGNOSIS — Y9301 Activity, walking, marching and hiking: Secondary | ICD-10-CM | POA: Diagnosis present

## 2022-06-26 DIAGNOSIS — R2689 Other abnormalities of gait and mobility: Secondary | ICD-10-CM | POA: Diagnosis not present

## 2022-06-26 DIAGNOSIS — W1830XA Fall on same level, unspecified, initial encounter: Secondary | ICD-10-CM | POA: Diagnosis present

## 2022-06-26 DIAGNOSIS — N390 Urinary tract infection, site not specified: Secondary | ICD-10-CM | POA: Diagnosis not present

## 2022-06-26 DIAGNOSIS — K219 Gastro-esophageal reflux disease without esophagitis: Secondary | ICD-10-CM | POA: Diagnosis present

## 2022-06-26 DIAGNOSIS — N12 Tubulo-interstitial nephritis, not specified as acute or chronic: Secondary | ICD-10-CM | POA: Diagnosis not present

## 2022-06-26 DIAGNOSIS — Z885 Allergy status to narcotic agent status: Secondary | ICD-10-CM | POA: Diagnosis not present

## 2022-06-26 DIAGNOSIS — N3000 Acute cystitis without hematuria: Secondary | ICD-10-CM | POA: Diagnosis not present

## 2022-06-26 DIAGNOSIS — R278 Other lack of coordination: Secondary | ICD-10-CM | POA: Diagnosis not present

## 2022-06-26 DIAGNOSIS — Y92009 Unspecified place in unspecified non-institutional (private) residence as the place of occurrence of the external cause: Secondary | ICD-10-CM | POA: Diagnosis not present

## 2022-06-26 DIAGNOSIS — E860 Dehydration: Secondary | ICD-10-CM | POA: Diagnosis present

## 2022-06-26 DIAGNOSIS — Z8261 Family history of arthritis: Secondary | ICD-10-CM | POA: Diagnosis not present

## 2022-06-26 DIAGNOSIS — M1990 Unspecified osteoarthritis, unspecified site: Secondary | ICD-10-CM | POA: Diagnosis present

## 2022-06-26 DIAGNOSIS — F32A Depression, unspecified: Secondary | ICD-10-CM | POA: Diagnosis present

## 2022-06-26 DIAGNOSIS — Z66 Do not resuscitate: Secondary | ICD-10-CM | POA: Diagnosis present

## 2022-06-26 DIAGNOSIS — Z90710 Acquired absence of both cervix and uterus: Secondary | ICD-10-CM | POA: Diagnosis not present

## 2022-06-26 DIAGNOSIS — I10 Essential (primary) hypertension: Secondary | ICD-10-CM | POA: Diagnosis present

## 2022-06-26 DIAGNOSIS — Z825 Family history of asthma and other chronic lower respiratory diseases: Secondary | ICD-10-CM | POA: Diagnosis not present

## 2022-06-26 DIAGNOSIS — K589 Irritable bowel syndrome without diarrhea: Secondary | ICD-10-CM | POA: Diagnosis present

## 2022-06-26 DIAGNOSIS — E739 Lactose intolerance, unspecified: Secondary | ICD-10-CM | POA: Diagnosis present

## 2022-06-26 DIAGNOSIS — B9620 Unspecified Escherichia coli [E. coli] as the cause of diseases classified elsewhere: Secondary | ICD-10-CM | POA: Diagnosis present

## 2022-06-26 DIAGNOSIS — M6281 Muscle weakness (generalized): Secondary | ICD-10-CM | POA: Diagnosis not present

## 2022-06-26 DIAGNOSIS — Z7401 Bed confinement status: Secondary | ICD-10-CM | POA: Diagnosis not present

## 2022-06-26 DIAGNOSIS — Z79899 Other long term (current) drug therapy: Secondary | ICD-10-CM | POA: Diagnosis not present

## 2022-06-26 DIAGNOSIS — Z87440 Personal history of urinary (tract) infections: Secondary | ICD-10-CM | POA: Diagnosis not present

## 2022-06-26 DIAGNOSIS — G629 Polyneuropathy, unspecified: Secondary | ICD-10-CM | POA: Diagnosis present

## 2022-06-26 DIAGNOSIS — J4489 Other specified chronic obstructive pulmonary disease: Secondary | ICD-10-CM | POA: Diagnosis present

## 2022-06-26 DIAGNOSIS — Z8249 Family history of ischemic heart disease and other diseases of the circulatory system: Secondary | ICD-10-CM | POA: Diagnosis not present

## 2022-06-26 DIAGNOSIS — J301 Allergic rhinitis due to pollen: Secondary | ICD-10-CM | POA: Diagnosis present

## 2022-06-26 DIAGNOSIS — R531 Weakness: Secondary | ICD-10-CM | POA: Diagnosis not present

## 2022-06-26 DIAGNOSIS — R519 Headache, unspecified: Secondary | ICD-10-CM | POA: Diagnosis not present

## 2022-06-26 DIAGNOSIS — Z881 Allergy status to other antibiotic agents status: Secondary | ICD-10-CM | POA: Diagnosis not present

## 2022-06-26 DIAGNOSIS — Z8619 Personal history of other infectious and parasitic diseases: Secondary | ICD-10-CM | POA: Diagnosis not present

## 2022-06-26 DIAGNOSIS — Z743 Need for continuous supervision: Secondary | ICD-10-CM | POA: Diagnosis not present

## 2022-06-26 DIAGNOSIS — Z96649 Presence of unspecified artificial hip joint: Secondary | ICD-10-CM | POA: Diagnosis present

## 2022-06-26 DIAGNOSIS — R1312 Dysphagia, oropharyngeal phase: Secondary | ICD-10-CM | POA: Diagnosis not present

## 2022-06-26 LAB — CBC
HCT: 36 % (ref 36.0–46.0)
Hemoglobin: 12.1 g/dL (ref 12.0–15.0)
MCH: 35.8 pg — ABNORMAL HIGH (ref 26.0–34.0)
MCHC: 33.6 g/dL (ref 30.0–36.0)
MCV: 106.5 fL — ABNORMAL HIGH (ref 80.0–100.0)
Platelets: 154 10*3/uL (ref 150–400)
RBC: 3.38 MIL/uL — ABNORMAL LOW (ref 3.87–5.11)
RDW: 13.8 % (ref 11.5–15.5)
WBC: 12.5 10*3/uL — ABNORMAL HIGH (ref 4.0–10.5)
nRBC: 0 % (ref 0.0–0.2)

## 2022-06-26 LAB — BASIC METABOLIC PANEL
Anion gap: 17 — ABNORMAL HIGH (ref 5–15)
Anion gap: 19 — ABNORMAL HIGH (ref 5–15)
BUN: 35 mg/dL — ABNORMAL HIGH (ref 8–23)
BUN: 32 mg/dL — ABNORMAL HIGH (ref 8–23)
CO2: 18 mmol/L — ABNORMAL LOW (ref 22–32)
CO2: 14 mmol/L — ABNORMAL LOW (ref 22–32)
Calcium: 8.7 mg/dL — ABNORMAL LOW (ref 8.9–10.3)
Calcium: 8.2 mg/dL — ABNORMAL LOW (ref 8.9–10.3)
Chloride: 101 mmol/L (ref 98–111)
Chloride: 104 mmol/L (ref 98–111)
Creatinine, Ser: 1.88 mg/dL — ABNORMAL HIGH (ref 0.44–1.00)
Creatinine, Ser: 1.89 mg/dL — ABNORMAL HIGH (ref 0.44–1.00)
GFR, Estimated: 25 mL/min — ABNORMAL LOW (ref >60)
GFR, Estimated: 25 mL/min — ABNORMAL LOW (ref >60)
Glucose, Bld: 96 mg/dL (ref 70–99)
Glucose, Bld: 109 mg/dL — ABNORMAL HIGH (ref 70–99)
Potassium: 4 mmol/L (ref 3.5–5.1)
Potassium: 4.2 mmol/L (ref 3.5–5.1)
Sodium: 136 mmol/L (ref 135–145)
Sodium: 137 mmol/L (ref 135–145)

## 2022-06-26 LAB — LACTIC ACID, PLASMA: Lactic Acid, Venous: 1.6 mmol/L (ref 0.5–1.9)

## 2022-06-26 MED ORDER — CEFTRIAXONE SODIUM 1 G IJ SOLR
1.0000 g | Freq: Once | INTRAMUSCULAR | Status: AC
  Administered 2022-06-26: 1 g via INTRAVENOUS
  Filled 2022-06-26: qty 10

## 2022-06-26 MED ORDER — ACETAMINOPHEN 325 MG PO TABS
650.0000 mg | ORAL_TABLET | Freq: Four times a day (QID) | ORAL | Status: DC | PRN
  Administered 2022-06-26 – 2022-07-01 (×8): 650 mg via ORAL
  Filled 2022-06-26 (×8): qty 2

## 2022-06-26 MED ORDER — SODIUM CHLORIDE 0.9 % IV BOLUS
1000.0000 mL | Freq: Once | INTRAVENOUS | Status: AC
Start: 2022-06-26 — End: 2022-06-26
  Administered 2022-06-26: 1000 mL via INTRAVENOUS

## 2022-06-26 MED ORDER — LACTATED RINGERS IV SOLN: INTRAVENOUS | Status: DC

## 2022-06-26 MED ORDER — ONDANSETRON HCL 4 MG/2ML IJ SOLN
4.0000 mg | Freq: Four times a day (QID) | INTRAMUSCULAR | Status: DC | PRN
  Administered 2022-06-27: 4 mg via INTRAVENOUS
  Filled 2022-06-26 (×2): qty 2

## 2022-06-26 MED ORDER — ENOXAPARIN SODIUM 30 MG/0.3ML IJ SOSY
30.0000 mg | PREFILLED_SYRINGE | INTRAMUSCULAR | Status: DC
  Administered 2022-06-26 – 2022-07-01 (×6): 30 mg via SUBCUTANEOUS
  Filled 2022-06-26 (×6): qty 0.3

## 2022-06-26 MED ORDER — ACETAMINOPHEN 650 MG RE SUPP: 650.0000 mg | Freq: Four times a day (QID) | RECTAL | Status: DC | PRN

## 2022-06-26 MED ORDER — SODIUM CHLORIDE 0.9 % IV SOLN
1.0000 g | INTRAVENOUS | Status: AC
  Administered 2022-06-27 – 2022-06-30 (×4): 1 g via INTRAVENOUS
  Filled 2022-06-26 (×5): qty 10

## 2022-06-26 MED ORDER — ONDANSETRON HCL 4 MG PO TABS
4.0000 mg | ORAL_TABLET | Freq: Four times a day (QID) | ORAL | Status: DC | PRN
  Administered 2022-06-30: 4 mg via ORAL
  Filled 2022-06-26: qty 1

## 2022-06-26 NOTE — Assessment & Plan Note (Signed)
Empiric rocephin UCx pending Trend WBC

## 2022-06-26 NOTE — ED Provider Notes (Signed)
[facility] EMERGENCY DEPARTMENT Provider Note  CSN: [id number] Arrival date & time: 06/25/22 2129  Chief Complaint(s) No chief complaint on file.  HPI Tina Riley is a 88 y.o. female who presents to the emergency department after a fall at home.  She reports that she normally uses a walker has a wheelchair to get around.  Today she lost control of her walker causing her to fal . She used her life alert to call EMS.  She reports being down for approximately 30 minutes before EMS arrived.  She endorses hitting her head but not loses consciousness.  She is endorsing mild headache.  No neck pain or back pain.  No hip pain or lower extremity pain.  She has been having lower abdominal discomfort and frequency recently.  She was told by EMS that it smelled like she had a urinary tract infection.  HPI  Past Medical History Past Medical History:  Diagnosis Date   Arthritis    Asthma    Chicken pox    Depression    Diverticulitis    Genital warts    GERD (gastroesophageal reflux disease)    Hay fever    History of frequent urinary tract infections    Hypertension    IBS (irritable bowel syndrome)    Lactose intolerance    Peripheral neuropathy    Urinary incontinence    Patient Active Problem List   Diagnosis Date Noted   AKI (acute kidney injury) (HCC) 06/26/2022   UTI (urinary tract infection) 06/26/2022   Protein-calorie malnutrition (HCC) 05/17/2022   Routine general medical examination at a health care facility 05/14/2022   Left hip pain 05/14/2022   Fall at home, initial encounter 11/04/2021   Abdominal pain 11/04/2021   Constipation 11/04/2021   Renal insufficiency 11/04/2021   Pulmonary nodule 11/04/2021   Dysphagia 09/18/2021   Hypokalemia 08/14/2021   Cognitive change 08/14/2021   Hyponatremia 08/12/2021   Abnormal urine 08/11/2021   Anxiety 02/24/2021   GERD (gastroesophageal reflux disease) 02/24/2021   Hair thinning 12/25/2020   HSV  (herpes simplex virus) infection 10/17/2020   Allergic rhinitis 10/17/2020   Hyperlipidemia 10/17/2020   Diarrhea 10/17/2020   Asthma 10/17/2020   Insomnia 10/17/2020   Poor appetite 10/17/2020   Tremor 10/17/2020   Home Medication(s) Prior to Admission medications   Medication Sig Start Date End Date Taking? Authorizing Provider  acetaminophen (TYLENOL) 500 MG tablet Take 500 mg by mouth daily as needed for headache or mild pain.    [provider]  albuterol (VENTOLIN HFA) 108 (90 Base) MCG/ACT inhaler Inhale 2 puffs into the lungs every 6 (six) hours as needed for wheezing or shortness of breath. 06/02/21   Myrlene Broker, MD  clonazePAM (KLONOPIN) 0.5 MG tablet Take 1 tablet (0.5 mg total) by mouth 2 (two) times daily. 12/01/21   Myrlene Broker, MD  gabapentin (NEURONTIN) 300 MG capsule Take 1 capsule (300 mg total) by mouth at bedtime. 05/14/22   Myrlene Broker, MD  levocetirizine (XYZAL) 5 MG tablet Take 1 tablet (5 mg total) by mouth daily. Patient taking differently: Take 5 mg by mouth every evening. 08/21/21   Myrlene Broker, MD  linaclotide Karl[facility]) 145 MCG CAPS capsule Take 1 capsule (145 mcg total) by mouth daily before breakfast. 03/19/22   Imogene Burn, MD  Melatonin 3 MG CAPS Take 1 capsule (3 mg total) by mouth at bedtime. 11/10/21   Alwyn Ren, MD  ondansetron [facility]) 4  MG tablet Take 1 tablet (4 mg total) by mouth every 6 (six) hours as needed for nausea. 11/10/21   Alwyn RenMathews, Elizabeth G, MD  pantoprazole (PROTONIX) 40 MG tablet Take 1 tablet (40 mg total) by mouth 2 (two) times daily. 04/19/22   Myrlene Brokerrawford, Elizabeth A, MD  Polyethyl Glycol-Propyl Glycol (SYSTANE OP) Place 1 drop into both eyes daily as needed (dry eyes).    [provider]  propranolol ER (INDERAL LA) 60 MG 24 hr capsule Take 1 capsule (60 mg total) by mouth daily. 12/01/21   Myrlene Brokerrawford, Elizabeth A, MD  psyllium (HYDROCIL/METAMUCIL) 95 % PACK Take 1 packet by  mouth daily. 11/11/21   Alwyn RenMathews, Elizabeth G, MD  rosuvastatin (CRESTOR) 5 MG tablet Take 1 tablet (5 mg total) by mouth at bedtime. 08/21/21   Myrlene Brokerrawford, Elizabeth A, MD  SYMBICORT 160-4.5 MCG/ACT inhaler Inhale 2 puffs into the lungs 2 (two) times daily. 08/21/21   Myrlene Brokerrawford, Elizabeth A, MD  valACYclovir (VALTREX) 1000 MG tablet Take 1 tablet (1,000 mg total) by mouth daily. 12/15/21   Myrlene Brokerrawford, Elizabeth A, MD                                                                                                                                    Allergies Cephalexin, Ciprofloxacin, Other, Meperidine hcl, Sulfa antibiotics, Cefdinir, and Doxycycline  Review of Systems Review of Systems As noted in HPI  Physical Exam Vital Signs  I have reviewed the triage vital signs BP (!) 123/53   Pulse 82   Temp 97.8 F (36.6 C) (Oral)   Resp (!) 24   SpO2 99%   Physical Exam Vitals reviewed.  Constitutional:      General: She is not in acute distress.    Appearance: She is well-developed. She is not diaphoretic.  HENT:     Head: Normocephalic and atraumatic.     Nose: Nose normal.  Eyes:     General: No scleral icterus.       Right eye: No discharge.        Left eye: No discharge.     Conjunctiva/sclera: Conjunctivae normal.     Pupils: Pupils are equal, round, and reactive to light.  Cardiovascular:     Rate and Rhythm: Normal rate and regular rhythm.     Heart sounds: No murmur heard.    No friction rub. No gallop.  Pulmonary:     Effort: Pulmonary effort is normal. No respiratory distress.     Breath sounds: Normal breath sounds. No stridor. No rales.  Abdominal:     General: There is no distension.     Palpations: Abdomen is soft.     Tenderness: There is abdominal tenderness in the suprapubic area. There is left CVA tenderness.  Musculoskeletal:        General: No tenderness.     Cervical back: Normal range of motion and neck supple. No spinous process tenderness or  muscular tenderness.      Right hip: No tenderness.     Left hip: No tenderness.  Skin:    General: Skin is warm and dry.     Findings: No erythema or rash.  Neurological:     Mental Status: She is alert and oriented to person, place, and time.     ED Results and Treatments Labs (all labs ordered are listed, but only abnormal results are displayed) Labs Reviewed  CBC - Abnormal; Notable for the following components:      Result Value   WBC 16.1 (*)    RBC 3.44 (*)    MCV 108.1 (*)    MCH 36.9 (*)    All other components within normal limits  BASIC METABOLIC PANEL - Abnormal; Notable for the following components:   CO2 18 (*)    BUN 35 (*)    Creatinine, Ser 1.88 (*)    Calcium 8.7 (*)    GFR, Estimated 25 (*)    Anion gap 17 (*)    All other components within normal limits  URINALYSIS, ROUTINE W REFLEX MICROSCOPIC - Abnormal; Notable for the following components:   APPearance CLOUDY (*)    Hgb urine dipstick MODERATE (*)    Ketones, ur 20 (*)    Protein, ur 100 (*)    Leukocytes,Ua MODERATE (*)    WBC, UA >50 (*)    Bacteria, UA MANY (*)    Non Squamous Epithelial 0-5 (*)    All other components within normal limits  URINE CULTURE  LACTIC ACID, PLASMA  LACTIC ACID, PLASMA  CBC  BASIC METABOLIC PANEL                                                                                                                         EKG  EKG Interpretation  Date/Time:    Ventricular Rate:    PR Interval:    QRS Duration:   QT Interval:    QTC Calculation:   R Axis:     Text Interpretation:         Radiology CT Head Wo Contrast  Result Date: 06/26/2022 CLINICAL DATA:  Recent fall with headaches, initial encounter EXAM: CT HEAD WITHOUT CONTRAST TECHNIQUE: Contiguous axial images were obtained from the base of the skull through the vertex without intravenous contrast. RADIATION DOSE REDUCTION: This exam was performed according to the departmental dose-optimization program which includes  automated exposure control, adjustment of the mA and/or kV according to patient size and/or use of iterative reconstruction technique. COMPARISON:  11/04/2021 FINDINGS: Brain: No evidence of acute infarction, hemorrhage, hydrocephalus, extra-axial collection or mass lesion/mass effect. Chronic atrophic and ischemic changes are noted. Vascular: No hyperdense vessel or unexpected calcification. Skull: Normal. Negative for fracture or focal lesion. Sinuses/Orbits: No acute finding. Other: None. IMPRESSION: Chronic changes without acute abnormality. Electronically Signed   By: Alcide Clever M.D.   On: 06/26/2022 03:10    Medications Ordered in ED Medications  cefTRIAXone (ROCEPHIN)  1 g in sodium chloride 0.9 % 100 mL IVPB (has no administration in time range)  enoxaparin (LOVENOX) injection 30 mg (has no administration in time range)  acetaminophen (TYLENOL) tablet 650 mg (has no administration in time range)    Or  acetaminophen (TYLENOL) suppository 650 mg (has no administration in time range)  ondansetron (ZOFRAN) tablet 4 mg (has no administration in time range)    Or  ondansetron (ZOFRAN) injection 4 mg (has no administration in time range)  lactated ringers infusion (has no administration in time range)  cefTRIAXone (ROCEPHIN) 1 g in sodium chloride 0.9 % 100 mL IVPB (0 g Intravenous Stopped 06/26/22 0126)  sodium chloride 0.9 % bolus 1,000 mL (0 mLs Intravenous Stopped 06/26/22 0320)                                                                                                                                     Procedures Procedures  (including critical care time)  Medical Decision Making / ED Course   Medical Decision Making Amount and/or Complexity of Data Reviewed Labs: ordered. Decision-making details documented in ED Course. Radiology: ordered and independent interpretation performed. Decision-making details documented in ED Course.  Risk Prescription drug management.     Patient presented after mechanical fall.  CT head obtained to rule out ICH and negative. No other injuries noted on exam requiring imaging.  Given the urinary symptoms, labs and urine were drawn and notable for leukocytosis and evidence of urinary tract infection.  Given her physical exam there is suspicion for pyelonephritis.  Lactic acid normal and not consistent with severe sepsis.  Patient does have a mild AKI.  She was given IV fluids and antibiotics.  Admitted to medicine for further workup and management.      Final Clinical Impression(s) / ED Diagnoses Final diagnoses:  Pyelonephritis           This chart was dictated using voice recognition software.  Despite best efforts to proofread,  errors can occur which can change the documentation meaning.    Nira Conn, MD 06/26/22 [phone]

## 2022-06-26 NOTE — H&P (Signed)
History and Physical    Patient: Tina Riley JYN:[id number] DOB: [birth year] DOA: 06/25/2022 DOS: the patient was seen and examined on 06/26/2022 PCP: Myrlene Broker, MD  Patient coming from: Home  Chief Complaint: Generalized weakness  HPI: Tina Riley is a 88 y.o. female with medical history significant of HTN, frequent prior UTIs.  Pt presents to ED after mechanical fall at home.  She reports that she normally uses a walker has a wheelchair to get around.  Today she lost control of her walker causing her to fal . She used her life alert to call EMS.  She reports being down for approximately 30 minutes before EMS arrived.  She endorses hitting her head but not loses consciousness.  She is endorsing mild headache.  No neck pain or back pain.  No hip pain or lower extremity pain.  She has been having lower abdominal discomfort and frequency recently.  She was told by EMS that it smelled like she had a urinary tract infection.       Review of Systems: As mentioned in the history of present illness. All other systems reviewed and are negative. Past Medical History:  Diagnosis Date   Arthritis    Asthma    Chicken pox    Depression    Diverticulitis    Genital warts    GERD (gastroesophageal reflux disease)    Hay fever    History of frequent urinary tract infections    Hypertension    IBS (irritable bowel syndrome)    Lactose intolerance    Peripheral neuropathy    Urinary incontinence    Past Surgical History:  Procedure Laterality Date   ABDOMINAL HYSTERECTOMY     Hip Replacement  2020   PARTIAL COLECTOMY  1980s   TONSILLECTOMY AND ADENOIDECTOMY  childhood   vocal cord tumor removal  2012   Social History:  reports that she has never smoked. She has never used smokeless tobacco. She reports that she does not drink alcohol and does not use drugs.  Allergies  Allergen Reactions   Cephalexin Palpitations   Ciprofloxacin Palpitations   Other Itching    Meperidine Hcl     Other reaction(s): Other Doesn't work   Sulfa Antibiotics Nausea And Vomiting   Cefdinir Nausea Only   Doxycycline Nausea Only    Family History  Problem Relation Age of Onset   Arthritis Mother    COPD Mother    Heart attack Father    Heart disease Father    Hypertension Father    Hyperlipidemia Father    Stroke Father    Stroke Sister    Hypertension Sister    Hyperlipidemia Sister    Hearing loss Sister    Cancer Sister    Arthritis Sister    Alcohol abuse Son    Diabetes Son    Drug abuse Son    Heart attack Son    Hypertension Son     Prior to Admission medications   Medication Sig Start Date End Date Taking? Authorizing Provider  acetaminophen (TYLENOL) 500 MG tablet Take 500 mg by mouth daily as needed for headache or mild pain.    [provider]  albuterol (VENTOLIN HFA) 108 (90 Base) MCG/ACT inhaler Inhale 2 puffs into the lungs every 6 (six) hours as needed for wheezing or shortness of breath. 06/02/21   Myrlene Broker, MD  clonazePAM (KLONOPIN) 0.5 MG tablet Take 1 tablet (0.5 mg total) by mouth 2 (two) times daily. 12/01/21  Myrlene Broker, MD  gabapentin (NEURONTIN) 300 MG capsule Take 1 capsule (300 mg total) by mouth at bedtime. 05/14/22   Myrlene Broker, MD  levocetirizine (XYZAL) 5 MG tablet Take 1 tablet (5 mg total) by mouth daily. Patient taking differently: Take 5 mg by mouth every evening. 08/21/21   Myrlene Broker, MD  linaclotide Karlene Einstein) 145 MCG CAPS capsule Take 1 capsule (145 mcg total) by mouth daily before breakfast. 03/19/22   Imogene Burn, MD  Melatonin 3 MG CAPS Take 1 capsule (3 mg total) by mouth at bedtime. 11/10/21   Alwyn Ren, MD  ondansetron (ZOFRAN) 4 MG tablet Take 1 tablet (4 mg total) by mouth every 6 (six) hours as needed for nausea. 11/10/21   Alwyn Ren, MD  pantoprazole (PROTONIX) 40 MG tablet Take 1 tablet (40 mg total) by mouth 2 (two) times daily.  04/19/22   Myrlene Broker, MD  Polyethyl Glycol-Propyl Glycol (SYSTANE OP) Place 1 drop into both eyes daily as needed (dry eyes).    [provider]  propranolol ER (INDERAL LA) 60 MG 24 hr capsule Take 1 capsule (60 mg total) by mouth daily. 12/01/21   Myrlene Broker, MD  psyllium (HYDROCIL/METAMUCIL) 95 % PACK Take 1 packet by mouth daily. 11/11/21   Alwyn Ren, MD  rosuvastatin (CRESTOR) 5 MG tablet Take 1 tablet (5 mg total) by mouth at bedtime. 08/21/21   Myrlene Broker, MD  SYMBICORT 160-4.5 MCG/ACT inhaler Inhale 2 puffs into the lungs 2 (two) times daily. 08/21/21   Myrlene Broker, MD  valACYclovir (VALTREX) 1000 MG tablet Take 1 tablet (1,000 mg total) by mouth daily. 12/15/21   Myrlene Broker, MD    Physical Exam: Vitals:   06/26/22 0018 06/26/22 0030 06/26/22 0145 06/26/22 0157  BP:  (!) 133/44 (!) 123/53   Pulse:  80 82   Resp:  (!) 26 (!) 24   Temp:    97.8 F (36.6 C)  TempSrc:    Oral  SpO2: 98% 98% 99%    Constitutional: NAD, calm, comfortable Eyes: PERRL, lids and conjunctivae normal ENMT: Mucous membranes are moist. Posterior pharynx clear of any exudate or lesions.Normal dentition.  Neck: normal, supple, no masses, no thyromegaly Respiratory: clear to auscultation bilaterally, no wheezing, no crackles. Normal respiratory effort. No accessory muscle use.  Cardiovascular: Regular rate and rhythm, no murmurs / rubs / gallops. No extremity edema. 2+ pedal pulses. No carotid bruits.  Abdomen: Suprapubic TTP, left CVA TTP Musculoskeletal: no clubbing / cyanosis. No joint deformity upper and lower extremities. Good ROM, no contractures. Normal muscle tone.  Skin: no rashes, lesions, ulcers. No induration Neurologic: CN 2-12 grossly intact. Sensation intact, DTR normal. Strength 5/5 in all 4.  Psychiatric: Normal judgment and insight. Alert and oriented x 3. Normal mood.   Data Reviewed:    Urinalysis    Component Value  Date/Time   COLORURINE YELLOW 06/25/2022 2151   APPEARANCEUR CLOUDY (A) 06/25/2022 2151   LABSPEC 1.013 06/25/2022 2151   PHURINE 5.0 06/25/2022 2151   GLUCOSEU NEGATIVE 06/25/2022 2151   HGBUR MODERATE (A) 06/25/2022 2151   BILIRUBINUR NEGATIVE 06/25/2022 2151   KETONESUR 20 (A) 06/25/2022 2151   PROTEINUR 100 (A) 06/25/2022 2151   NITRITE NEGATIVE 06/25/2022 2151   LEUKOCYTESUR MODERATE (A) 06/25/2022 2151    CBC    Component Value Date/Time   WBC 16.1 (H) 06/25/2022 2155   RBC 3.44 (L) 06/25/2022 2155   HGB  12.7 06/25/2022 2155   HCT 37.2 06/25/2022 2155   PLT 180 06/25/2022 2155   MCV 108.1 (H) 06/25/2022 2155   MCH 36.9 (H) 06/25/2022 2155   MCHC 34.1 06/25/2022 2155   RDW 13.8 06/25/2022 2155   LYMPHSABS 2.9 11/03/2021 2244   MONOABS 0.9 11/03/2021 2244   EOSABS 0.0 11/03/2021 2244   BASOSABS 0.1 11/03/2021 2244   CMP     Component Value Date/Time   NA 136 06/25/2022 2155   NA 141 04/09/2020 0000   K 4.0 06/25/2022 2155   CL 101 06/25/2022 2155   CO2 18 (L) 06/25/2022 2155   GLUCOSE 96 06/25/2022 2155   BUN 35 (H) 06/25/2022 2155   BUN 12 04/09/2020 0000   CREATININE 1.88 (H) 06/25/2022 2155   CALCIUM 8.7 (L) 06/25/2022 2155   PROT 5.3 (L) 11/08/2021 0731   ALBUMIN 2.8 (L) 11/08/2021 0731   AST 15 11/08/2021 0731   ALT 19 11/08/2021 0731   ALKPHOS 62 11/08/2021 0731   BILITOT 0.1 (L) 11/08/2021 0731   GFRNONAA 25 (L) 06/25/2022 2155   GFRAA 70 04/09/2020 0000     Assessment and Plan: * UTI (urinary tract infection) Empiric rocephin UCx pending Trend WBC  AKI (acute kidney injury) (HCC) ? Pre-renal / ATN in setting of UTI ? IVF Strict intake and output Renal US Repeat BMP in AM      Advance Care Planning:   Code Status: DNR DNR form + MOST form in chart  Consults: None  Family Communication: No family in room  Severity of Illness: The appropriate patient status for this patient is OBSERVATION. Observation status is judged to be  reasonable and necessary in order to provide the required intensity of service to ensure the patient's safety. The patient's presenting symptoms, physical exam findings, and initial radiographic and laboratory data in the context of their medical condition is felt to place them at decreased risk for further clinical deterioration. Furthermore, it is anticipated that the patient will be medically stable for discharge from the hospital within 2 midnights of admission.   Author: Hillary Bow., DO 06/26/2022 4:15 AM  For on call review [email or web address].

## 2022-06-26 NOTE — Progress Notes (Signed)
Subjective: Patient admitted this morning, see detailed H&P by Dr. Julian Reil 88 year old female with medical history of hypertension, frequent UTIs came to ED with mechanical fall at home.  Patient only uses a walker as a wheelchair to get around.  Patient lost control of her walker causing her to fall.  She used her life alert to call EMS.  Patient was down for about 30 minutes before EMS arrived.  Patient endorses hitting her head but no loss of consciousness.  In the ED she was found to have UTI and acute kidney injury.  Vitals:   06/26/22 0700 06/26/22 0800  BP: (!) 100/41 (!) 101/43  Pulse: 84 80  Resp: (!) 26 (!) 25  Temp:    SpO2: 93% 95%      A/P  UTI -Patient started on empiric Rocephin -Follow urine culture results  Acute kidney injury -Patient's baseline creatinine was 0.75 as of April 2023 -Today presenting with creatinine of 1.89 -?  ATN versus prerenal -Renal ultrasound ordered; will follow results   Meredeth Ide [facility]

## 2022-06-26 NOTE — Assessment & Plan Note (Signed)
?   Pre-renal / ATN in setting of UTI ? IVF Strict intake and output Renal US Repeat BMP in AM

## 2022-06-27 DIAGNOSIS — N179 Acute kidney failure, unspecified: Secondary | ICD-10-CM | POA: Diagnosis not present

## 2022-06-27 DIAGNOSIS — N3000 Acute cystitis without hematuria: Secondary | ICD-10-CM | POA: Diagnosis not present

## 2022-06-27 LAB — CBC
HCT: 36 % (ref 36.0–46.0)
Hemoglobin: 12.4 g/dL (ref 12.0–15.0)
MCH: 35.8 pg — ABNORMAL HIGH (ref 26.0–34.0)
MCHC: 34.4 g/dL (ref 30.0–36.0)
MCV: 104 fL — ABNORMAL HIGH (ref 80.0–100.0)
Platelets: 132 10*3/uL — ABNORMAL LOW (ref 150–400)
RBC: 3.46 MIL/uL — ABNORMAL LOW (ref 3.87–5.11)
RDW: 13.9 % (ref 11.5–15.5)
WBC: 9.9 10*3/uL (ref 4.0–10.5)
nRBC: 0 % (ref 0.0–0.2)

## 2022-06-27 LAB — BASIC METABOLIC PANEL
Anion gap: 11 (ref 5–15)
BUN: 27 mg/dL — ABNORMAL HIGH (ref 8–23)
CO2: 21 mmol/L — ABNORMAL LOW (ref 22–32)
Calcium: 8.1 mg/dL — ABNORMAL LOW (ref 8.9–10.3)
Chloride: 103 mmol/L (ref 98–111)
Creatinine, Ser: 1.33 mg/dL — ABNORMAL HIGH (ref 0.44–1.00)
GFR, Estimated: 38 mL/min — ABNORMAL LOW (ref >60)
Glucose, Bld: 145 mg/dL — ABNORMAL HIGH (ref 70–99)
Potassium: 3.5 mmol/L (ref 3.5–5.1)
Sodium: 135 mmol/L (ref 135–145)

## 2022-06-27 MED ORDER — MOMETASONE FURO-FORMOTEROL FUM 200-5 MCG/ACT IN AERO
2.0000 | INHALATION_SPRAY | Freq: Two times a day (BID) | RESPIRATORY_TRACT | Status: DC
  Administered 2022-06-27 – 2022-06-30 (×7): 2 via RESPIRATORY_TRACT
  Filled 2022-06-27: qty 8.8

## 2022-06-27 MED ORDER — PROPRANOLOL HCL ER 60 MG PO CP24
60.0000 mg | ORAL_CAPSULE | Freq: Every day | ORAL | Status: DC
  Administered 2022-06-27 – 2022-07-01 (×5): 60 mg via ORAL
  Filled 2022-06-27 (×5): qty 1

## 2022-06-27 MED ORDER — CLONAZEPAM 0.5 MG PO TABS
0.5000 mg | ORAL_TABLET | Freq: Two times a day (BID) | ORAL | Status: DC | PRN
  Administered 2022-06-27 – 2022-06-30 (×4): 0.5 mg via ORAL
  Filled 2022-06-27 (×4): qty 1

## 2022-06-27 MED ORDER — GABAPENTIN 300 MG PO CAPS
300.0000 mg | ORAL_CAPSULE | Freq: Every day | ORAL | Status: DC
  Administered 2022-06-27 – 2022-06-30 (×4): 300 mg via ORAL
  Filled 2022-06-27 (×4): qty 1

## 2022-06-27 MED ORDER — ROSUVASTATIN CALCIUM 5 MG PO TABS
5.0000 mg | ORAL_TABLET | Freq: Every day | ORAL | Status: DC
  Administered 2022-06-27 – 2022-06-30 (×4): 5 mg via ORAL
  Filled 2022-06-27 (×4): qty 1

## 2022-06-27 MED ORDER — VALACYCLOVIR HCL 500 MG PO TABS
1000.0000 mg | ORAL_TABLET | Freq: Every day | ORAL | Status: DC
  Administered 2022-06-27 – 2022-07-01 (×5): 1000 mg via ORAL
  Filled 2022-06-27 (×5): qty 2

## 2022-06-27 MED ORDER — HYDRALAZINE HCL 25 MG PO TABS
25.0000 mg | ORAL_TABLET | Freq: Four times a day (QID) | ORAL | Status: DC | PRN
  Administered 2022-06-29 – 2022-06-30 (×2): 25 mg via ORAL
  Filled 2022-06-27 (×2): qty 1

## 2022-06-27 MED ORDER — LACTATED RINGERS IV SOLN: INTRAVENOUS | Status: AC

## 2022-06-27 NOTE — Progress Notes (Signed)
[facility]  PROGRESS NOTE  Tina Riley ZOX:[id number] DOB: 04-21-1932 DOA: 06/25/2022 PCP: Myrlene Broker, MD   Brief HPI:    88 year old female with medical history of hypertension, frequent UTIs came to ED with mechanical fall at home. Patient only uses a walker as a wheelchair to get around. Patient lost control of her walker causing her to fall. She used her life alert to call EMS. Patient was down for about 30 minutes before EMS arrived. Patient endorses hitting her head but no loss of consciousness. In the ED she was found to have UTI and acute kidney injury.    Subjective   Patient seen and examined, feels better this morning.  Denies pain.   Assessment/Plan:     UTI -Presented with weakness and fall; found to have UTI -Wound culture growing gram-negative rods -Continue Rocephin -Follow urine culture results  Acute kidney injury -Patient's baseline creatinine was 0.75 as of April 2023 -Presented with creatinine of 1.89 -Likely prerenal from dehydration/poor p.o. intake -Improved to 1.33 with IV fluids -Continue IV fluids  Hypertension -Blood pressure is elevated -Patient takes propranolol 60 mg daily at home; which was held at admission due to acute kidney injury/dehydration -Will restart propranolol    Medications     enoxaparin (LOVENOX) injection  30 mg Subcutaneous Q24H     Data Reviewed:   CBG:  No results for input(s): "GLUCAP" in the last 168 hours.  SpO2: 96 %    Vitals:   06/27/22 0141 06/27/22 0520 06/27/22 1013 06/27/22 1443  BP: 138/66 (!) 142/59 (!) 185/78 (!) 147/64  Pulse: 88 91 94 80  Resp:   18 20  Temp: 97.7 F (36.5 C) 98.9 F (37.2 C) 98.5 F (36.9 C) 97.7 F (36.5 C)  TempSrc: Oral Oral Oral Oral  SpO2: 97% 97% 95% 96%      Data Reviewed:  Basic Metabolic Panel: Recent Labs  Lab 06/25/22 2155 06/26/22 0535 06/27/22 1133  NA 136 137 135  K 4.0 4.2 3.5  CL 101 104 103  CO2 18* 14* 21*   GLUCOSE 96 109* 145*  BUN 35* 32* 27*  CREATININE 1.88* 1.89* 1.33*  CALCIUM 8.7* 8.2* 8.1*    CBC: Recent Labs  Lab 06/25/22 2155 06/26/22 0535 06/27/22 1133  WBC 16.1* 12.5* 9.9  HGB 12.7 12.1 12.4  HCT 37.2 36.0 36.0  MCV 108.1* 106.5* 104.0*  PLT 180 154 132*    LFT No results for input(s): "AST", "ALT", "ALKPHOS", "BILITOT", "PROT", "ALBUMIN" in the last 168 hours.   Antibiotics: Anti-infectives (From admission, onward)    Start     Dose/Rate Route Frequency Ordered Stop   06/26/22 2200  cefTRIAXone (ROCEPHIN) 1 g in sodium chloride 0.9 % 100 mL IVPB        1 g 200 mL/hr over 30 Minutes Intravenous Every 24 hours 06/26/22 0353     06/26/22 0015  cefTRIAXone (ROCEPHIN) 1 g in sodium chloride 0.9 % 100 mL IVPB        1 g 200 mL/hr over 30 Minutes Intravenous  Once 06/26/22 0012 06/26/22 0126        DVT prophylaxis: Lovenox  Code Status: DNR  Family Communication: No family at bedside   CONSULTS    Objective    Physical Examination:   General: Appears in no acute distress Cardiovascular: S1-S2, regular Respiratory: Clear to auscultation bilaterally Abdomen: Soft, nontender, no organomegaly Extremities: No edema in the lower extremities Neurologic: Alert, oriented x 3, intact insight  and judgment, no focal deficit noted   Status is: Inpatient:             Meredeth Ide   [facility] If 7PM-7AM, please contact night-coverage at [email or web address], Office  [phone]   06/27/2022, 2:57 PM  LOS: 1 day

## 2022-06-28 ENCOUNTER — Ambulatory Visit: Payer: Medicare Other | Admitting: Family Medicine

## 2022-06-28 DIAGNOSIS — N3000 Acute cystitis without hematuria: Secondary | ICD-10-CM | POA: Diagnosis not present

## 2022-06-28 DIAGNOSIS — N179 Acute kidney failure, unspecified: Secondary | ICD-10-CM | POA: Diagnosis not present

## 2022-06-28 LAB — BASIC METABOLIC PANEL
Anion gap: 11 (ref 5–15)
BUN: 19 mg/dL (ref 8–23)
CO2: 23 mmol/L (ref 22–32)
Calcium: 7.9 mg/dL — ABNORMAL LOW (ref 8.9–10.3)
Chloride: 102 mmol/L (ref 98–111)
Creatinine, Ser: 1.02 mg/dL — ABNORMAL HIGH (ref 0.44–1.00)
GFR, Estimated: 53 mL/min — ABNORMAL LOW (ref >60)
Glucose, Bld: 117 mg/dL — ABNORMAL HIGH (ref 70–99)
Potassium: 3.6 mmol/L (ref 3.5–5.1)
Sodium: 136 mmol/L (ref 135–145)

## 2022-06-28 LAB — URINE CULTURE: Culture: >=100000 — AB

## 2022-06-28 MED ORDER — GUAIFENESIN ER 600 MG PO TB12
600.0000 mg | ORAL_TABLET | Freq: Two times a day (BID) | ORAL | Status: DC
  Administered 2022-06-28 – 2022-07-01 (×7): 600 mg via ORAL
  Filled 2022-06-28 (×7): qty 1

## 2022-06-28 NOTE — Evaluation (Signed)
Physical Therapy Evaluation  Patient Details Name: Tina Riley MRN: [id number] DOB: 1932/05/02 Today's Date: 06/28/2022  History of Present Illness  Pt is an 88 y/o female who presents on 06/26/2022 s/p fall at home, hitting her head. She was found to have a UTI and AKI. CT negative for acute changes. PMH significant for HTN, frequent UTI's, peripheral neuropathy, hip replacement in 2020, vocal cord tumor removal 2012, partial colectomy 1980's.   Clinical Impression  Pt admitted with above diagnosis. Pt currently with functional limitations due to the deficits listed below (see PT Problem List). At the time of PT eval pt was able to perform transfers with up to max assist from bed to chair. Pt reports she does not have consistent assistance at home, and she would be alone most of the time. Recommend SNF level rehab to maximize functional independence and safety prior to returning home to her apartment. Pt does not want to go to a SNF, and states she wants to return to her apartment where she has the last bit of her independence. Upon discussion about performance during PT session, pt acknowledges that she would likely not be successful at home. She states she would like to talk with her nephew who is her POA before making any decisions about rehab. Acutely, pt will benefit from skilled PT to increase their independence and safety with mobility to allow discharge to the venue listed below.          Recommendations for follow up therapy are one component of a multi-disciplinary discharge planning process, led by the attending physician.  Recommendations may be updated based on patient status, additional functional criteria and insurance authorization.  Follow Up Recommendations Skilled nursing-short term rehab (<3 hours/day) Can patient physically be transported by private vehicle: No    Assistance Recommended at Discharge Frequent or constant Supervision/Assistance  Patient can return home  with the following  A lot of help with walking and/or transfers;A little help with bathing/dressing/bathroom;Assistance with cooking/housework;Assist for transportation;Help with stairs or ramp for entrance    Equipment Recommendations Other (comment) (TBD by next venue of care)  Recommendations for Other Services       Functional Status Assessment Patient has had a recent decline in their functional status and demonstrates the ability to make significant improvements in function in a reasonable and predictable amount of time.     Precautions / Restrictions Precautions Precautions: Fall Restrictions Weight Bearing Restrictions: No      Mobility  Bed Mobility Overal bed mobility: Needs Assistance Bed Mobility: Supine to Sit     Supine to sit: Max assist     General bed mobility comments: Pt able to transition to sidelying and get her legs off the EOB without assist, however required max assist to elevate trunk to full sitting position. Noted R lateral lean as pt states she has a R inner ear problem and gets dizzy.    Transfers Overall transfer level: Needs assistance Equipment used: Rolling walker (2 wheels), 1 person hand held assist Transfers: Sit to/from Stand, Bed to chair/wheelchair/BSC Sit to Stand: From elevated surface, Min assist Stand pivot transfers: From elevated surface, Max assist         General transfer comment: Pt stood well to walker however unable to advance feet to step towards the recliner. Max assist provided to transition from bed to chair.    Ambulation/Gait               General Gait Details: Unable  Stairs  Wheelchair Mobility    Modified Rankin (Stroke Patients Only)       Balance Overall balance assessment: Needs assistance Sitting-balance support: Feet supported, Bilateral upper extremity supported Sitting balance-Leahy Scale: Poor   Postural control: Right lateral lean Standing balance support: Bilateral  upper extremity supported, During functional activity, Reliant on assistive device for balance Standing balance-Leahy Scale: Poor                               Pertinent Vitals/Pain Pain Assessment Pain Assessment: Faces Faces Pain Scale: Hurts a little bit Pain Location: General grimacing with movement. No pain specified Pain Descriptors / Indicators: Grimacing Pain Intervention(s): Limited activity within patient's tolerance, Monitored during session, Repositioned    Home Living Family/patient expects to be discharged to:: Private residence Living Arrangements: Alone Available Help at Discharge: Family;Available PRN/intermittently Type of Home: Apartment Home Access: Ramped entrance       Home Layout: One level Home Equipment: Grab bars - toilet;Grab bars - tub/shower;Rolling Walker (2 wheels);Shower seat      Prior Function Prior Level of Function : Independent/Modified Independent             Mobility Comments: Uses the RW with a tray on it. Does not drive, Unable to ambulate community distances. ADLs Comments: Independent with ADL's, however nephew goes to the grocery store for her.     Hand Dominance   Dominant Hand: Right    Extremity/Trunk Assessment   Upper Extremity Assessment Upper Extremity Assessment: Generalized weakness    Lower Extremity Assessment Lower Extremity Assessment: Generalized weakness    Cervical / Trunk Assessment Cervical / Trunk Assessment: Kyphotic  Communication   Communication: No difficulties  Cognition Arousal/Alertness: Awake/alert Behavior During Therapy: WFL for tasks assessed/performed (Emotional/tearful at times) Overall Cognitive Status: Impaired/Different from baseline Area of Impairment: Memory, Safety/judgement, Awareness, Problem solving                     Memory: Decreased short-term memory (Repeating statements throughout session)   Safety/Judgement: Decreased awareness of safety,  Decreased awareness of deficits Awareness: Emergent Problem Solving: Slow processing, Requires verbal cues, Requires tactile cues          General Comments      Exercises     Assessment/Plan    PT Assessment Patient needs continued PT services  PT Problem List Decreased strength;Decreased range of motion;Decreased activity tolerance;Decreased balance;Decreased mobility;Decreased cognition;Decreased knowledge of use of DME;Decreased safety awareness;Decreased knowledge of precautions;Pain       PT Treatment Interventions DME instruction;Gait training;Functional mobility training;Therapeutic activities;Therapeutic exercise;Balance training;Cognitive remediation;Patient/family education    PT Goals (Current goals can be found in the Care Plan section)  Acute Rehab PT Goals Patient Stated Goal: Return to her apartment PT Goal Formulation: With patient Time For Goal Achievement: 07/12/22 Potential to Achieve Goals: Good    Frequency Min 2X/week     Co-evaluation               AM-PAC PT "6 Clicks" Mobility  Outcome Measure Help needed turning from your back to your side while in a flat bed without using bedrails?: A Little Help needed moving from lying on your back to sitting on the side of a flat bed without using bedrails?: A Lot Help needed moving to and from a bed to a chair (including a wheelchair)?: A Lot Help needed standing up from a chair using your arms (e.g., wheelchair or bedside chair)?:  A Little Help needed to walk in hospital room?: Total Help needed climbing 3-5 steps with a railing? : Total 6 Click Score: 12    End of Session Equipment Utilized During Treatment: Gait belt Activity Tolerance: Patient limited by fatigue (weakness) Patient left: in chair;with call bell/phone within reach;with chair alarm set Nurse Communication: Mobility status PT Visit Diagnosis: Unsteadiness on feet (R26.81);Muscle weakness (generalized) (M62.81)    Time:  1320-1405 PT Time Calculation (min) (ACUTE ONLY): 45 min   Charges:   PT Evaluation $PT Eval Moderate Complexity: 1 Mod PT Treatments $Gait Training: 23-37 mins        Conni Slipper, PT, DPT Acute Rehabilitation Services Secure Chat Preferred Office: [phone]   Marylynn Pearson 06/28/2022, 3:36 PM

## 2022-06-28 NOTE — Progress Notes (Addendum)
[facility]  PROGRESS NOTE  Tina Riley IOX:[id number] DOB: Oct 12, 1931 DOA: 06/25/2022 PCP: Myrlene Broker, MD   Brief HPI:    88 year old female with medical history of hypertension, frequent UTIs came to ED with mechanical fall at home. Patient only uses a walker as a wheelchair to get around. Patient lost control of her walker causing her to fall. She used her life alert to call EMS. Patient was down for about 30 minutes before EMS arrived. Patient endorses hitting her head but no loss of consciousness. In the ED she was found to have UTI and acute kidney injury.    Subjective   Patient seen and examined, has been coughing up phlegm.   Assessment/Plan:     UTI -Presented with weakness and fall; found to have UTI -Wound culture grew E. coli sensitive to Rocephin -Continue Rocephin -WBC has improved to 9000   Acute kidney injury -Patient's baseline creatinine was 0.75 as of April 2023 -Presented with creatinine of 1.89 -Likely prerenal from dehydration/poor p.o. intake -Improved to 1.01 with IV fluids -Discontinue IV fluids  Hypertension -Blood pressure is elevated -Patient takes propranolol 60 mg daily at home; which was held at admission due to acute kidney injury/dehydration -Propranolol has been restarted  COPD -Continue Dulera -Add Mucinex 600 mg p.o. twice daily  PT evaluation obtained-skilled nursing facility recommended for rehab.   Will consult TOC for skilled nursing facility placement for rehab.  Medications     enoxaparin (LOVENOX) injection  30 mg Subcutaneous Q24H   gabapentin  300 mg Oral QHS   mometasone-formoterol  2 puff Inhalation BID   propranolol ER  60 mg Oral Daily   rosuvastatin  5 mg Oral QHS   valACYclovir  1,000 mg Oral Daily     Data Reviewed:   CBG:  No results for input(s): "GLUCAP" in the last 168 hours.  SpO2: 91 %    Vitals:   06/27/22 2134 06/28/22 0536 06/28/22 0727 06/28/22 0914  BP: (!)  150/66 (!) 168/71  (!) 120/54  Pulse: 88 82 80 81  Resp:   17 16  Temp: 98.9 F (37.2 C) (!) 97.4 F (36.3 C)  99 F (37.2 C)  TempSrc: Oral Oral  Oral  SpO2: 90% 93% 94% 91%      Data Reviewed:  Basic Metabolic Panel: Recent Labs  Lab 06/25/22 2155 06/26/22 0535 06/27/22 1133 06/28/22 0407  NA 136 137 135 136  K 4.0 4.2 3.5 3.6  CL 101 104 103 102  CO2 18* 14* 21* 23  GLUCOSE 96 109* 145* 117*  BUN 35* 32* 27* 19  CREATININE 1.88* 1.89* 1.33* 1.02*  CALCIUM 8.7* 8.2* 8.1* 7.9*    CBC: Recent Labs  Lab 06/25/22 2155 06/26/22 0535 06/27/22 1133  WBC 16.1* 12.5* 9.9  HGB 12.7 12.1 12.4  HCT 37.2 36.0 36.0  MCV 108.1* 106.5* 104.0*  PLT 180 154 132*    LFT No results for input(s): "AST", "ALT", "ALKPHOS", "BILITOT", "PROT", "ALBUMIN" in the last 168 hours.   Antibiotics: Anti-infectives (From admission, onward)    Start     Dose/Rate Route Frequency Ordered Stop   06/27/22 1600  valACYclovir (VALTREX) tablet 1,000 mg        1,000 mg Oral Daily 06/27/22 1504     06/26/22 2200  cefTRIAXone (ROCEPHIN) 1 g in sodium chloride 0.9 % 100 mL IVPB        1 g 200 mL/hr over 30 Minutes Intravenous Every 24 hours 06/26/22 0353  06/26/22 0015  cefTRIAXone (ROCEPHIN) 1 g in sodium chloride 0.9 % 100 mL IVPB        1 g 200 mL/hr over 30 Minutes Intravenous  Once 06/26/22 0012 06/26/22 0126        DVT prophylaxis: Lovenox  Code Status: DNR  Family Communication: No family at bedside   CONSULTS    Objective    Physical Examination:   General-appears in no acute distress Heart-S1-S2, regular, no murmur auscultated Lungs-clear to auscultation bilaterally,  Abdomen-soft, nontender, no organomegaly Extremities-no edema in the lower extremities Neuro-alert, oriented x3, no focal deficit noted   Status is: Inpatient:             Meredeth Ide   [facility] If 7PM-7AM, please contact night-coverage at [email or web address], Office   [phone]   06/28/2022, 2:35 PM  LOS: 2 days

## 2022-06-29 MED ORDER — PANTOPRAZOLE SODIUM 40 MG PO TBEC
40.0000 mg | DELAYED_RELEASE_TABLET | Freq: Two times a day (BID) | ORAL | Status: DC
  Administered 2022-06-29 – 2022-07-01 (×5): 40 mg via ORAL
  Filled 2022-06-29 (×5): qty 1

## 2022-06-29 MED ORDER — POLYETHYLENE GLYCOL 3350 17 G PO PACK
17.0000 g | PACK | Freq: Once | ORAL | Status: AC
  Administered 2022-06-29: 17 g via ORAL
  Filled 2022-06-29: qty 1

## 2022-06-29 MED ORDER — BISACODYL 10 MG RE SUPP
10.0000 mg | Freq: Once | RECTAL | Status: AC
  Administered 2022-06-29: 10 mg via RECTAL
  Filled 2022-06-29: qty 1

## 2022-06-29 NOTE — Progress Notes (Signed)
[facility]  PROGRESS NOTE  Niria Bily Buer [id number] DOB: [birth year] DOA: 06/25/2022 PCP: Hoyt Koch, MD   Brief HPI:    88 year old female with medical history of hypertension, frequent UTIs came to ED with mechanical fall at home. Patient only uses a walker as a wheelchair to get around. Patient lost control of her walker causing her to fall. She used her life alert to call EMS. Patient was down for about 30 minutes before EMS arrived. Patient endorses hitting her head but no loss of consciousness. In the ED she was found to have UTI and acute kidney injury.    Subjective     Patient seen and examined, denies any complaints.   Assessment/Plan:     UTI -Presented with weakness and fall; found to have UTI -Wound culture grew E. coli sensitive to Rocephin -Continue Rocephin -WBC has improved to 9000   Acute kidney injury -Patient's baseline creatinine was 0.75 as of April 2023 -Presented with creatinine of 1.89 -Likely prerenal from dehydration/poor p.o. intake -Improved to 1.01 with IV fluids -Discontinued IV fluids  Hypertension -Blood pressure is elevated -Patient takes propranolol 60 mg daily at home; which was held at admission due to acute kidney injury/dehydration -Propranolol has been restarted  COPD -Continue Dulera -Added Mucinex 600 mg p.o. twice daily  PT evaluation obtained-skilled nursing facility recommended for rehab.   Consulted TOC for skilled nursing facility placement for rehab.  Medications     enoxaparin (LOVENOX) injection  30 mg Subcutaneous Q24H   gabapentin  300 mg Oral QHS   guaiFENesin  600 mg Oral BID   mometasone-formoterol  2 puff Inhalation BID   pantoprazole  40 mg Oral BID   propranolol ER  60 mg Oral Daily   rosuvastatin  5 mg Oral QHS   valACYclovir  1,000 mg Oral Daily     Data Reviewed:   CBG:  No results for input(s): "GLUCAP" in the last 168 hours.  SpO2: 96 %    Vitals:   06/28/22  2103 06/29/22 0415 06/29/22 0942 06/29/22 0952  BP: (!) 171/61 (!) 161/59  (!) 144/62  Pulse: 69 61  66  Resp: 18 18  16   Temp: 98.9 F (37.2 C) (!) 97.5 F (36.4 C)  98 F (36.7 C)  TempSrc: Oral Oral  Oral  SpO2: 100% 99% 98% 96%      Data Reviewed:  Basic Metabolic Panel: Recent Labs  Lab 06/25/22 2155 06/26/22 0535 06/27/22 1133 06/28/22 0407  NA 136 137 135 136  K 4.0 4.2 3.5 3.6  CL 101 104 103 102  CO2 18* 14* 21* 23  GLUCOSE 96 109* 145* 117*  BUN 35* 32* 27* 19  CREATININE 1.88* 1.89* 1.33* 1.02*  CALCIUM 8.7* 8.2* 8.1* 7.9*    CBC: Recent Labs  Lab 06/25/22 2155 06/26/22 0535 06/27/22 1133  WBC 16.1* 12.5* 9.9  HGB 12.7 12.1 12.4  HCT 37.2 36.0 36.0  MCV 108.1* 106.5* 104.0*  PLT 180 154 132*    LFT No results for input(s): "AST", "ALT", "ALKPHOS", "BILITOT", "PROT", "ALBUMIN" in the last 168 hours.   Antibiotics: Anti-infectives (From admission, onward)    Start     Dose/Rate Route Frequency Ordered Stop   06/27/22 1600  valACYclovir (VALTREX) tablet 1,000 mg        1,000 mg Oral Daily 06/27/22 1504     06/26/22 2200  cefTRIAXone (ROCEPHIN) 1 g in sodium chloride 0.9 % 100 mL IVPB  1 g 200 mL/hr over 30 Minutes Intravenous Every 24 hours 06/26/22 0353     06/26/22 0015  cefTRIAXone (ROCEPHIN) 1 g in sodium chloride 0.9 % 100 mL IVPB        1 g 200 mL/hr over 30 Minutes Intravenous  Once 06/26/22 0012 06/26/22 0126        DVT prophylaxis: Lovenox  Code Status: DNR  Family Communication: No family at bedside   CONSULTS    Objective    Physical Examination:   Appears in no acute distress S1-S2, regular Lungs clear to auscultation bilaterally Abdomen is soft, nontender, no organomegaly Extremities showed no edema  Status is: Inpatient:             Meredeth Ide   [facility] If 7PM-7AM, please contact night-coverage at [email or web address], Office  [phone]   06/29/2022, 2:54 PM  LOS: 3 days

## 2022-06-29 NOTE — TOC Initial Note (Signed)
Transition of Care [facility]) - Initial/Assessment Note    Patient Details  Name: Tina Riley MRN: [id number] Date of Birth: 10/07/1931  Transition [facility]) CM/SW Contact:    Coralee Pesa, [place] Phone Number: 06/29/2022, 12:42 PM  Clinical Narrative:                 CSW met with pt at bedside to discuss PT rec for SNF. Pt initially not wanting to go when PT discussed it with her, but now stating she knows she needs to go. CSW went over the process in detail and pt is agreeable. She has a preference for [facility], as she has been before, and to stay in [place]. Pt currently lives in an apartment alone and has had several falls. She has help from her niece and nephew as well as aides to assist. She uses a wheelchair and walker and has a ramp. Pt states CSW can speak with [name] and he is her HCPOA. CSW to complete workup and faxout. Bed offers to be provided to pt and family when available. TOC will continue to follow for DC needs.  Expected Discharge Plan: Skilled Nursing Facility Barriers to Discharge: Continued Medical Work up, SNF Pending bed offer, Insurance Authorization   Patient Goals and CMS Choice Patient states their goals for this hospitalization and ongoing recovery are:: Pt states her goal is to be able to walk again. CMS Medicare.gov Compare Post Acute Care list provided to:: Patient Choice offered to / list presented to : Patient  Expected Discharge Plan and Services Expected Discharge Plan: [place] Choice: [place] Living arrangements for the past 2 months: Apartment                                      Prior Living Arrangements/Services Living arrangements for the past 2 months: Apartment Lives with:: Self Patient language and need for interpreter reviewed:: Yes Do you feel safe going back to the place where you live?: Yes      Need for Family Participation in Patient Care: Yes (Comment) Care  giver support system in place?: Yes (comment)   Criminal Activity/Legal Involvement Pertinent to Current Situation/Hospitalization: No - Comment as needed  Activities of Daily Living Home Assistive Devices/Equipment: Walker (specify type) ADL Screening (condition at time of admission) Patient's cognitive ability adequate to safely complete daily activities?: No Is the patient deaf or have difficulty hearing?: No Does the patient have difficulty seeing, even when wearing glasses/contacts?: No Does the patient have difficulty concentrating, remembering, or making decisions?: Yes Patient able to express need for assistance with ADLs?: Yes Does the patient have difficulty dressing or bathing?: Yes Independently performs ADLs?: No Communication: Independent Dressing (OT): Needs assistance Is this a change from baseline?: Change from baseline, expected to last <3days Grooming: Independent Feeding: Independent Bathing: Needs assistance Is this a change from baseline?: Change from baseline, expected to last <3 days Toileting: Needs assistance Is this a change from baseline?: Change from baseline, expected to last <3 days In/Out Bed: Needs assistance Is this a change from baseline?: Change from baseline, expected to last <3 days Walks in Home: Independent with device (comment) (walker) Does the patient have difficulty walking or climbing stairs?: Yes Weakness of Legs: Both Weakness of Arms/Hands: None  Permission Sought/Granted Permission sought to share information with : Family Supports Permission granted to  share information with : Yes, Verbal Permission Granted  Share Information with NAME: [place] granted to share info w Relationship: Nephew/ HCPOA  Permission granted to share info w Contact Information: [phone]  Emotional Assessment Appearance:: Appears stated age Attitude/Demeanor/Rapport: Engaged Affect (typically observed): Appropriate Orientation: :  Oriented to Self, Oriented to Place, Oriented to  Time, Oriented to Situation Alcohol / Substance Use: Not Applicable Psych Involvement: No (comment)  Admission diagnosis:  UTI (urinary tract infection) [N39.0] Pyelonephritis [N12] AKI (acute kidney injury) ([place]) [N17.9] Patient Active Problem List   Diagnosis Date Noted   AKI (acute kidney injury) ([place]) 06/26/2022   UTI (urinary tract infection) 06/26/2022   Protein-calorie malnutrition ([place]) 05/17/2022   Routine general medical examination at a health care facility 05/14/2022   Left hip pain 05/14/2022   Fall at home, initial encounter 11/04/2021   Abdominal pain 11/04/2021   Constipation 11/04/2021   Renal insufficiency 11/04/2021   Pulmonary nodule 11/04/2021   Dysphagia 09/18/2021   Hypokalemia 08/14/2021   Cognitive change 08/14/2021   Hyponatremia 08/12/2021   Abnormal urine 08/11/2021   Anxiety 02/24/2021   GERD (gastroesophageal reflux disease) 02/24/2021   Hair thinning 12/25/2020   HSV (herpes simplex virus) infection 10/17/2020   Allergic rhinitis 10/17/2020   Hyperlipidemia 10/17/2020   Diarrhea 10/17/2020   Asthma 10/17/2020   Insomnia 10/17/2020   Poor appetite 10/17/2020   Tremor 10/17/2020   PCP:  Hoyt Koch, MD Pharmacy:   [facility] [address] [place] [address] Phone: [phone] Fax: [phone]     Social Determinants of Health (SDOH) Interventions    Readmission Risk Interventions     No data to display

## 2022-06-29 NOTE — Progress Notes (Signed)
PT Cancellation Note  Patient Details Name: Tina Riley MRN: [id number] DOB: 03-12-1932   Cancelled Treatment:    Reason Eval/Treat Not Completed: Patient declined, reports she just had a suppository to help with her bowels and would like to stay in bed for a while. Will follow-up as time allows.  Kathlyn Sacramento, PT, DPT Physical Therapist Acute Rehabilitation Services [facility] 06/29/2022, 3:10 PM

## 2022-06-29 NOTE — Care Management Important Message (Signed)
Important Message  Patient Details  Name: Tina Riley MRN: [id number] Date of Birth: 02/08/1932   Medicare Important Message Given:  Yes     Sherilyn Banker 06/29/2022, 11:58 AM

## 2022-06-30 NOTE — NC FL2 (Signed)
[place] MEDICAID FL2 LEVEL OF CARE FORM     IDENTIFICATION  Patient Name: Tina Riley Birthdate: 03/13/1932 Sex: female Admission Date (Current Location): 06/25/2022  [facility] and [place] Number:  Producer, television/film/video and Address:  [facility], [address] [place], [place] [address]      Provider Number: [id number]  Attending Physician Name and Address:  Glade Lloyd, MD  Relative Name and Phone Number:  Alvera Singh  [phone]    Current Level of Care: Hospital Recommended Level of Care: Skilled Nursing Facility Prior Approval Number:    Date Approved/Denied:   PASRR Number: [id number]  Discharge Plan: SNF    Current Diagnoses: Patient Active Problem List   Diagnosis Date Noted   AKI (acute kidney injury) ([facility]) 06/26/2022   UTI (urinary tract infection) 06/26/2022   Protein-calorie malnutrition ([facility]) 05/17/2022   Routine general medical examination at a health care facility 05/14/2022   Left hip pain 05/14/2022   Fall at home, initial encounter 11/04/2021   Abdominal pain 11/04/2021   Constipation 11/04/2021   Renal insufficiency 11/04/2021   Pulmonary nodule 11/04/2021   Dysphagia 09/18/2021   Hypokalemia 08/14/2021   Cognitive change 08/14/2021   Hyponatremia 08/12/2021   Abnormal urine 08/11/2021   Anxiety 02/24/2021   GERD (gastroesophageal reflux disease) 02/24/2021   Hair thinning 12/25/2020   HSV (herpes simplex virus) infection 10/17/2020   Allergic rhinitis 10/17/2020   Hyperlipidemia 10/17/2020   Diarrhea 10/17/2020   Asthma 10/17/2020   Insomnia 10/17/2020   Poor appetite 10/17/2020   Tremor 10/17/2020    Orientation RESPIRATION BLADDER Height & Weight     Self, Situation, Place  Normal Incontinent, External catheter Weight:   Height:     BEHAVIORAL SYMPTOMS/MOOD NEUROLOGICAL BOWEL NUTRITION STATUS      Incontinent Diet (See DC summary)  AMBULATORY STATUS COMMUNICATION OF NEEDS Skin   Extensive  Assist Verbally Normal                       Personal Care Assistance Level of Assistance  Bathing, Feeding, Dressing Bathing Assistance: Maximum assistance Feeding assistance: Limited assistance Dressing Assistance: Maximum assistance     Functional Limitations Info  Sight, Hearing, Speech Sight Info: Impaired Hearing Info: Impaired Speech Info: Adequate    SPECIAL CARE FACTORS FREQUENCY  PT (By licensed PT), OT (By licensed OT)     PT Frequency: 5x week OT Frequency: 5x week            Contractures Contractures Info: Not present    Additional Factors Info  Code Status, Allergies Code Status Info: DNR Allergies Info: Cephalexin  Ciprofloxacin  Other  Meperidine Hcl  Sulfa Antibiotics  Cefdinir  Doxycycline           Current Medications (06/30/2022):  This is the current hospital active medication list Current Facility-Administered Medications  Medication Dose Route Frequency Provider Last Rate Last Admin   acetaminophen (TYLENOL) tablet 650 mg  650 mg Oral Q6H PRN Hillary Bow, DO   650 mg at 06/29/22 2221   Or   acetaminophen (TYLENOL) suppository 650 mg  650 mg Rectal Q6H PRN Hillary Bow, DO       cefTRIAXone (ROCEPHIN) 1 g in sodium chloride 0.9 % 100 mL IVPB  1 g Intravenous Q24H Glade Lloyd, MD   Stopped at 06/29/22 2256   clonazePAM (KLONOPIN) tablet 0.5 mg  0.5 mg Oral BID PRN Meredeth Ide, MD   0.5  mg at 06/29/22 2221   enoxaparin (LOVENOX) injection 30 mg  30 mg Subcutaneous Q24H Lyda Perone M, DO   30 mg at 06/30/22 1055   gabapentin (NEURONTIN) capsule 300 mg  300 mg Oral QHS Meredeth Ide, MD   300 mg at 06/29/22 2221   guaiFENesin (MUCINEX) 12 hr tablet 600 mg  600 mg Oral BID Meredeth Ide, MD   600 mg at 06/30/22 1054   hydrALAZINE (APRESOLINE) tablet 25 mg  25 mg Oral Q6H PRN Meredeth Ide, MD   25 mg at 06/29/22 1959   mometasone-formoterol (DULERA) 200-5 MCG/ACT inhaler 2 puff  2 puff Inhalation BID Meredeth Ide, MD   2 puff  at 06/30/22 0933   ondansetron (ZOFRAN) tablet 4 mg  4 mg Oral Q6H PRN Hillary Bow, DO       Or   ondansetron [facility]) injection 4 mg  4 mg Intravenous Q6H PRN Hillary Bow, DO   4 mg at 06/27/22 1221   pantoprazole (PROTONIX) EC tablet 40 mg  40 mg Oral BID Meredeth Ide, MD   40 mg at 06/30/22 1055   propranolol ER (INDERAL LA) 24 hr capsule 60 mg  60 mg Oral Daily Meredeth Ide, MD   60 mg at 06/30/22 1055   rosuvastatin (CRESTOR) tablet 5 mg  5 mg Oral QHS Meredeth Ide, MD   5 mg at 06/29/22 2221   valACYclovir (VALTREX) tablet 1,000 mg  1,000 mg Oral Daily Meredeth Ide, MD   1,000 mg at 06/30/22 1054     Discharge Medications: Please see discharge summary for a list of discharge medications.  Relevant Imaging Results:  Relevant Lab Results:   Additional Information SS#  [id number], [place]

## 2022-06-30 NOTE — TOC Progression Note (Signed)
Transition of Care [facility]) - Progression Note    Patient Details  Name: Tina Riley MRN: [id number] Date of Birth: May 30, 1932  [facility] CM/SW Contact  Carley Hammed, [place] Phone Number: 06/30/2022, 12:42 PM  Clinical Narrative:    CSW spoke with pt at bedside to discuss bed choice. CSW reviewed Medicare list and chose [facility]. Pt requested CSW update Mitch nephew/ HCPOA, CSW left a VM. CSW updated facility who will have a bed tomorrow, and started authorization. TOC will continue to follow for DC needs.  12:30 CSW received a call back from [place] who is agreeable to [place], but asked if pt had been accepted to [place] or [place], as they are closer to his home to be able to visit her. CSW stated that pt preferred to be in [place], so referral's weren't sent out that far. CSW sent out referrals to see if any acceptances are made. CSW to discuss with pt if another option becomes available. TOC will continue to follow for DC needs.   Expected Discharge Plan: Skilled Nursing Facility Barriers to Discharge: Continued Medical Work up, SNF Pending bed offer, English as a second language teacher  Expected Discharge Plan and Services Expected Discharge Plan: Skilled Nursing Facility     Post Acute Care Choice: Skilled Nursing Facility Living arrangements for the past 2 months: Apartment                                       Social Determinants of Health (SDOH) Interventions    Readmission Risk Interventions     No data to display

## 2022-06-30 NOTE — Progress Notes (Signed)
PROGRESS NOTE    Tina Riley  ZOX:[id number] DOB: 1932-02-01 DOA: 06/25/2022 PCP: Myrlene Broker, MD   Brief Narrative:  88 year old female with medical history of hypertension, frequent UTIs came to ED with mechanical fall at home.  On presentation, she was found to have UTI and acute kidney injury.  She was started on IV fluids and antibiotics.  PT recommended SNF placement.  Assessment & Plan:   E. coli UTI  -Currently on Rocephin.  Today is day 5 of Rocephin.  DC antibiotics after today's dose.  Acute kidney injury -Presented with creatinine of 1.89; creatinine was 0.75 in April 2023 -Treated with IV fluids.  Resolved.  Hypertension -Continue propranolol.  COPD -Respiratory status stable.  Continue Dulera  Physical deconditioning -PT recommends SNF placement.  TOC following.   DVT prophylaxis: Lovenox Code Status: DNR Family Communication: None at bedside Disposition Plan: Status is: Inpatient Remains inpatient appropriate because: Of need for SNF placement.  Currently medically stable for discharge.  Consultants: None  Procedures: None  Antimicrobials: Rocephin   Subjective: Patient seen and examined.  Poor historian.  No fever, vomiting, seizures or worsening shortness of breath reported  Objective: Vitals:   06/29/22 2027 06/30/22 0313 06/30/22 0749 06/30/22 0933  BP:  (!) 148/56 (!) 154/55   Pulse:  75 68   Resp:   18   Temp:  97.7 F (36.5 C) 98.3 F (36.8 C)   TempSrc:  Oral Oral   SpO2: 99% 95% 97% 97%    Intake/Output Summary (Last 24 hours) at 06/30/2022 1317 Last data filed at 06/29/2022 1730 Gross per 24 hour  Intake --  Output 250 ml  Net -250 ml   There were no vitals filed for this visit.  Examination:  General exam: Appears calm and comfortable.  On room air.  Elderly female lying in bed.  Slow to respond.  Poor historian Respiratory system: Bilateral decreased breath sounds at bases with some scattered  crackles Cardiovascular system: S1 & S2 heard, Rate controlled Gastrointestinal system: Abdomen is nondistended, soft and nontender. Normal bowel sounds heard. Extremities: No cyanosis, clubbing, edema   Data Reviewed: I have personally reviewed following labs and imaging studies  CBC: Recent Labs  Lab 06/25/22 2155 06/26/22 0535 06/27/22 1133  WBC 16.1* 12.5* 9.9  HGB 12.7 12.1 12.4  HCT 37.2 36.0 36.0  MCV 108.1* 106.5* 104.0*  PLT 180 154 132*   Basic Metabolic Panel: Recent Labs  Lab 06/25/22 2155 06/26/22 0535 06/27/22 1133 06/28/22 0407  NA 136 137 135 136  K 4.0 4.2 3.5 3.6  CL 101 104 103 102  CO2 18* 14* 21* 23  GLUCOSE 96 109* 145* 117*  BUN 35* 32* 27* 19  CREATININE 1.88* 1.89* 1.33* 1.02*  CALCIUM 8.7* 8.2* 8.1* 7.9*   GFR: CrCl cannot be calculated (Unknown ideal weight.). Liver Function Tests: No results for input(s): "AST", "ALT", "ALKPHOS", "BILITOT", "PROT", "ALBUMIN" in the last 168 hours. No results for input(s): "LIPASE", "AMYLASE" in the last 168 hours. No results for input(s): "AMMONIA" in the last 168 hours. Coagulation Profile: No results for input(s): "INR", "PROTIME" in the last 168 hours. Cardiac Enzymes: No results for input(s): "CKTOTAL", "CKMB", "CKMBINDEX", "TROPONINI" in the last 168 hours. BNP (last 3 results) No results for input(s): "PROBNP" in the last 8760 hours. HbA1C: No results for input(s): "HGBA1C" in the last 72 hours. CBG: No results for input(s): "GLUCAP" in the last 168 hours. Lipid Profile: No results for input(s): "CHOL", "HDL", "LDLCALC", "  TRIG", "CHOLHDL", "LDLDIRECT" in the last 72 hours. Thyroid Function Tests: No results for input(s): "TSH", "T4TOTAL", "FREET4", "T3FREE", "THYROIDAB" in the last 72 hours. Anemia Panel: No results for input(s): "VITAMINB12", "FOLATE", "FERRITIN", "TIBC", "IRON", "RETICCTPCT" in the last 72 hours. Sepsis Labs: Recent Labs  Lab 06/26/22 0045  LATICACIDVEN 1.6    Recent  Results (from the past 240 hour(s))  Urine Culture     Status: Abnormal   Collection Time: 06/25/22  9:51 PM   Specimen: Urine, Clean Catch  Result Value Ref Range Status   Specimen Description URINE, CLEAN CATCH  Final   Special Requests   Final    NONE Performed at [facility], [address] [place], [place] [address]    Culture >=100,000 COLONIES/mL ESCHERICHIA COLI (A)  Final   Report Status 06/28/2022 FINAL  Final   Organism ID, Bacteria ESCHERICHIA COLI (A)  Final      Susceptibility   Escherichia coli - MIC*    AMPICILLIN <=2 SENSITIVE Sensitive     CEFAZOLIN <=4 SENSITIVE Sensitive     CEFEPIME <=0.12 SENSITIVE Sensitive     CEFTRIAXONE <=0.25 SENSITIVE Sensitive     CIPROFLOXACIN <=0.25 SENSITIVE Sensitive     GENTAMICIN <=1 SENSITIVE Sensitive     IMIPENEM <=0.25 SENSITIVE Sensitive     NITROFURANTOIN <=16 SENSITIVE Sensitive     TRIMETH/SULFA <=20 SENSITIVE Sensitive     AMPICILLIN/SULBACTAM <=2 SENSITIVE Sensitive     PIP/TAZO <=4 SENSITIVE Sensitive     * >=100,000 COLONIES/mL ESCHERICHIA COLI         Radiology Studies: No results found.      Scheduled Meds:  enoxaparin (LOVENOX) injection  30 mg Subcutaneous Q24H   gabapentin  300 mg Oral QHS   guaiFENesin  600 mg Oral BID   mometasone-formoterol  2 puff Inhalation BID   pantoprazole  40 mg Oral BID   propranolol ER  60 mg Oral Daily   rosuvastatin  5 mg Oral QHS   valACYclovir  1,000 mg Oral Daily   Continuous Infusions:  cefTRIAXone (ROCEPHIN)  IV Stopped (06/29/22 2256)          Glade Lloyd, MD [facility] 06/30/2022, 1:17 PM

## 2022-06-30 NOTE — Progress Notes (Signed)
Physical Therapy Treatment Patient Details Name: Tina Riley MRN: [id number] DOB: May 07, 1932 Today's Date: 06/30/2022   History of Present Illness Pt is an 88 y/o female who presents on 06/26/2022 s/p fall at home, hitting her head. She was found to have a UTI and AKI. CT negative for acute changes. PMH significant for HTN, frequent UTI's, peripheral neuropathy, hip replacement in 2020, vocal cord tumor removal 2012, partial colectomy 1980's.    PT Comments    Pt pleasant and motivated to participate. Focus on BLE strengthening exercises and transfer training. Pt performed x 10 serial sit to stands from elevated flaps of Corene Cornea. Requiring mod-maxA for functional mobility. Continue to recommend SNF for ongoing Physical Therapy.      Recommendations for follow up therapy are one component of a multi-disciplinary discharge planning process, led by the attending physician.  Recommendations may be updated based on patient status, additional functional criteria and insurance authorization.  Follow Up Recommendations  Skilled nursing-short term rehab (<3 hours/day) Can patient physically be transported by private vehicle: No   Assistance Recommended at Discharge Frequent or constant Supervision/Assistance  Patient can return home with the following A lot of help with walking and/or transfers;A little help with bathing/dressing/bathroom;Assistance with cooking/housework;Assist for transportation;Help with stairs or ramp for entrance   Equipment Recommendations  Other (comment) (defer)    Recommendations for Other Services       Precautions / Restrictions Precautions Precautions: Fall Restrictions Weight Bearing Restrictions: No     Mobility  Bed Mobility Overal bed mobility: Needs Assistance Bed Mobility: Supine to Sit     Supine to sit: Mod assist     General bed mobility comments: Pt initiating transitioning to EOB. Requiring modA at trunk to elevate     Transfers Overall transfer level: Needs assistance Equipment used: Rolling walker (2 wheels), Ambulation equipment used Transfers: Sit to/from Stand, Bed to chair/wheelchair/BSC Sit to Stand: Max assist           General transfer comment: Attempted initial stand to RW, however pt with heavy retropulsion and unable to achieve upright or weight shift. Karleen Dolphin to transfer to [facility] and then recliner; tactile assist for hip extension Transfer via Lift Equipment: Stedy  Ambulation/Gait                   Stairs             Wheelchair Mobility    Modified Rankin (Stroke Patients Only)       Balance Overall balance assessment: Needs assistance Sitting-balance support: Feet supported Sitting balance-Leahy Scale: Fair     Standing balance support: Bilateral upper extremity supported, During functional activity, Reliant on assistive device for balance Standing balance-Leahy Scale: Poor                              Cognition Arousal/Alertness: Awake/alert Behavior During Therapy: WFL for tasks assessed/performed Overall Cognitive Status: Impaired/Different from baseline Area of Impairment: Memory, Safety/judgement, Awareness, Problem solving                     Memory: Decreased short-term memory   Safety/Judgement: Decreased awareness of safety, Decreased awareness of deficits Awareness: Emergent Problem Solving: Slow processing, Requires verbal cues, Requires tactile cues          Exercises General Exercises - Lower Extremity Heel Slides: Both, 10 reps, Supine Straight Leg Raises: Both, 10 reps, Supine Other Exercises Other Exercises: x10  Sit to stands in [place]    General Comments        Pertinent Vitals/Pain Pain Assessment Pain Assessment: Faces Faces Pain Scale: Hurts a little bit Pain Location: headache, bilateral knees Pain Descriptors / Indicators: Headache, Discomfort Pain Intervention(s): Limited activity  within patient's tolerance, Monitored during session    Home Living                          Prior Function            PT Goals (current goals can now be found in the care plan section) Acute Rehab PT Goals Patient Stated Goal: Return to her apartment Potential to Achieve Goals: Good Progress towards PT goals: Progressing toward goals    Frequency    Min 2X/week      PT Plan Current plan remains appropriate    Co-evaluation              AM-PAC PT "6 Clicks" Mobility   Outcome Measure  Help needed turning from your back to your side while in a flat bed without using bedrails?: A Little Help needed moving from lying on your back to sitting on the side of a flat bed without using bedrails?: A Lot Help needed moving to and from a bed to a chair (including a wheelchair)?: A Lot Help needed standing up from a chair using your arms (e.g., wheelchair or bedside chair)?: Total Help needed to walk in hospital room?: Total Help needed climbing 3-5 steps with a railing? : Total 6 Click Score: 10    End of Session Equipment Utilized During Treatment: Gait belt Activity Tolerance: Patient tolerated treatment well Patient left: in chair;with call bell/phone within reach;with chair alarm set Nurse Communication: Mobility status PT Visit Diagnosis: Unsteadiness on feet (R26.81);Muscle weakness (generalized) (M62.81)     Time: 2924-4628 PT Time Calculation (min) (ACUTE ONLY): 29 min  Charges:  $Therapeutic Activity: 23-37 mins                     Lillia Pauls, PT, DPT Acute Rehabilitation Services Office [phone]    Norval Morton 06/30/2022, 5:04 PM

## 2022-07-01 DIAGNOSIS — R2689 Other abnormalities of gait and mobility: Secondary | ICD-10-CM | POA: Diagnosis not present

## 2022-07-01 DIAGNOSIS — J449 Chronic obstructive pulmonary disease, unspecified: Secondary | ICD-10-CM | POA: Diagnosis not present

## 2022-07-01 DIAGNOSIS — Z7401 Bed confinement status: Secondary | ICD-10-CM | POA: Diagnosis not present

## 2022-07-01 DIAGNOSIS — N3000 Acute cystitis without hematuria: Secondary | ICD-10-CM | POA: Diagnosis not present

## 2022-07-01 DIAGNOSIS — E785 Hyperlipidemia, unspecified: Secondary | ICD-10-CM | POA: Diagnosis not present

## 2022-07-01 DIAGNOSIS — I10 Essential (primary) hypertension: Secondary | ICD-10-CM | POA: Diagnosis not present

## 2022-07-01 DIAGNOSIS — J441 Chronic obstructive pulmonary disease with (acute) exacerbation: Secondary | ICD-10-CM | POA: Diagnosis not present

## 2022-07-01 DIAGNOSIS — M6281 Muscle weakness (generalized): Secondary | ICD-10-CM | POA: Diagnosis not present

## 2022-07-01 DIAGNOSIS — R278 Other lack of coordination: Secondary | ICD-10-CM | POA: Diagnosis not present

## 2022-07-01 DIAGNOSIS — R1312 Dysphagia, oropharyngeal phase: Secondary | ICD-10-CM | POA: Diagnosis not present

## 2022-07-01 DIAGNOSIS — R197 Diarrhea, unspecified: Secondary | ICD-10-CM | POA: Diagnosis not present

## 2022-07-01 DIAGNOSIS — K588 Other irritable bowel syndrome: Secondary | ICD-10-CM | POA: Diagnosis not present

## 2022-07-01 DIAGNOSIS — J45909 Unspecified asthma, uncomplicated: Secondary | ICD-10-CM | POA: Diagnosis not present

## 2022-07-01 DIAGNOSIS — N390 Urinary tract infection, site not specified: Secondary | ICD-10-CM | POA: Diagnosis not present

## 2022-07-01 DIAGNOSIS — N178 Other acute kidney failure: Secondary | ICD-10-CM | POA: Diagnosis not present

## 2022-07-01 DIAGNOSIS — D649 Anemia, unspecified: Secondary | ICD-10-CM | POA: Diagnosis not present

## 2022-07-01 DIAGNOSIS — K2100 Gastro-esophageal reflux disease with esophagitis, without bleeding: Secondary | ICD-10-CM | POA: Diagnosis not present

## 2022-07-01 DIAGNOSIS — R531 Weakness: Secondary | ICD-10-CM | POA: Diagnosis not present

## 2022-07-01 DIAGNOSIS — Z743 Need for continuous supervision: Secondary | ICD-10-CM | POA: Diagnosis not present

## 2022-07-01 DIAGNOSIS — Z9181 History of falling: Secondary | ICD-10-CM | POA: Diagnosis not present

## 2022-07-01 DIAGNOSIS — J4520 Mild intermittent asthma, uncomplicated: Secondary | ICD-10-CM | POA: Diagnosis not present

## 2022-07-01 DIAGNOSIS — N179 Acute kidney failure, unspecified: Secondary | ICD-10-CM | POA: Diagnosis not present

## 2022-07-01 DIAGNOSIS — G64 Other disorders of peripheral nervous system: Secondary | ICD-10-CM | POA: Diagnosis not present

## 2022-07-01 MED ORDER — MELATONIN 3 MG PO CAPS: 3.0000 mg | ORAL_CAPSULE | Freq: Every evening | ORAL | Status: DC | PRN

## 2022-07-01 MED ORDER — CLONAZEPAM 0.5 MG PO TABS: 0.5000 mg | ORAL_TABLET | Freq: Two times a day (BID) | ORAL | 0 refills | Status: DC | PRN

## 2022-07-01 MED ORDER — GABAPENTIN 300 MG PO CAPS: 300.0000 mg | ORAL_CAPSULE | Freq: Every day | ORAL | 0 refills | Status: DC

## 2022-07-01 NOTE — Progress Notes (Signed)
Report called to [facility] at this time. Pt transported off unit via [facility] gurney with all belongings at side. Pt remains alert/oriented and stable at baseline. Upper and lower dentures and eyeglasses in place. AVS and paper prescriptions given to PTAR.

## 2022-07-01 NOTE — TOC Transition Note (Signed)
Transition of Care [facility]) - CM/SW Discharge Note   Patient Details  Name: Tina Riley MRN: [id number] Date of Birth: [birth year]  Transition of Care [facility]) CM/SW Contact:  Carley Hammed, LCSWA Phone Number: 07/01/2022, 1:08 PM   Clinical Narrative:    Pt to be transported to [facility] via [facility]. Nurse to call report to [phone]. Rm 604   Final next level of care: Skilled Nursing Facility Barriers to Discharge: Barriers Resolved   Patient Goals and CMS Choice Patient states their goals for this hospitalization and ongoing recovery are:: Pt states her goal is to be able to walk again. CMS Medicare.gov Compare Post Acute Care list provided to:: Patient Choice offered to / list presented to : Patient  Discharge Placement              Patient [facility] bed at: [facility] Patient to be transferred to facility by: PT[facility] Name of family member notified: Alvera Singh Patient and family notified of of transfer: 07/01/22  Discharge Plan and Services     Post Acute Care Choice: Skilled Nursing Facility                               Social Determinants of Health (SDOH) Interventions     Readmission Risk Interventions     No data to display

## 2022-07-01 NOTE — Discharge Summary (Signed)
Physician Discharge Summary  Tina Riley RSW:[id number] DOB: 08/09/1931 DOA: 06/25/2022  PCP: Myrlene Broker, MD  Admit date: 06/25/2022 Discharge date: 07/01/2022  Admitted From: Home Disposition: SNF  Recommendations for Outpatient Follow-up:  Follow up with SNF provider at earliest convenience  follow up in ED if symptoms worsen or new appear   Home Health: No Equipment/Devices: None  Discharge Condition: Stable CODE STATUS: Full Diet recommendation: Heart healthy  Brief/Interim Summary: 88 year old female with medical history of hypertension, frequent UTIs came to ED with mechanical fall at home.  On presentation, she was found to have UTI and acute kidney injury.  She was started on IV fluids and antibiotics.  Urine culture grew E. coli.  She has completed 5-day course of Rocephin.  Antibiotics have been discontinued.  PT recommended SNF placement.  She is hemodynamically stable.  She will be discharged to SNF today if bed is available.  Discharge Diagnoses:     E. coli UTI  - She has completed 5-day course of Rocephin.  Antibiotics have been discontinued.  Currently hemodynamically stable.  Afebrile.  Tolerating diet.   Acute kidney injury -Presented with creatinine of 1.89; creatinine was 0.75 in April 2023 -Treated with IV fluids.  Resolved.  Creatinine 1.02 on 06/28/2022.   Hypertension -Continue propranolol.   COPD -Respiratory status stable.  Continue current inhaled regimen.   Physical deconditioning -PT recommends SNF placement.      Discharge Instructions  Discharge Instructions     Diet - low sodium heart healthy   Complete by: As directed    Increase activity slowly   Complete by: As directed       Allergies as of 07/01/2022       Reactions   Cephalexin Palpitations   Ciprofloxacin Palpitations   Other Itching   Meperidine Hcl    Other reaction(s): Other Doesn't work   Sulfa Antibiotics Nausea And Vomiting   Cefdinir  Nausea Only   Doxycycline Nausea Only        Medication List     TAKE these medications    acetaminophen 500 MG tablet Commonly known as: TYLENOL Take 500 mg by mouth daily as needed for headache or mild pain.   albuterol 108 (90 Base) MCG/ACT inhaler Commonly known as: VENTOLIN HFA Inhale 2 puffs into the lungs every 6 (six) hours as needed for wheezing or shortness of breath.   clonazePAM 0.5 MG tablet Commonly known as: KLONOPIN Take 1 tablet (0.5 mg total) by mouth 2 (two) times daily as needed for anxiety. What changed:  when to take this reasons to take this   gabapentin 300 MG capsule Commonly known as: NEURONTIN Take 1 capsule (300 mg total) by mouth at bedtime.   levocetirizine 5 MG tablet Commonly known as: XYZAL Take 1 tablet (5 mg total) by mouth daily. What changed: when to take this   Linzess 145 MCG Caps capsule Generic drug: linaclotide Take 1 capsule (145 mcg total) by mouth daily before breakfast.   Melatonin 3 MG Caps Take 1 capsule (3 mg total) by mouth at bedtime as needed (sleep).   ondansetron 4 MG tablet Commonly known as: ZOFRAN Take 1 tablet (4 mg total) by mouth every 6 (six) hours as needed for nausea.   pantoprazole 40 MG tablet Commonly known as: PROTONIX Take 1 tablet (40 mg total) by mouth 2 (two) times daily.   propranolol ER 60 MG 24 hr capsule Commonly known as: INDERAL LA Take 1 capsule (60 mg total) by  mouth daily.   psyllium 0.52 g capsule Commonly known as: REGULOID Take 2 g by mouth at bedtime. 2 gummies (Metamucil)   rosuvastatin 5 MG tablet Commonly known as: CRESTOR Take 1 tablet (5 mg total) by mouth at bedtime.   Symbicort 160-4.5 MCG/ACT inhaler Generic drug: budesonide-formoterol Inhale 2 puffs into the lungs 2 (two) times daily.   SYSTANE OP Place 1 drop into both eyes daily as needed (dry eyes).   valACYclovir 1000 MG tablet Commonly known as: VALTREX Take 1 tablet (1,000 mg total) by mouth  daily.        Follow-up Information     Myrlene Brokerrawford, Elizabeth A, MD. Schedule an appointment as soon as possible for a visit in 1 week(s).   Specialty: Internal Medicine Contact information: [address] [place] [address] [phone]                Allergies  Allergen Reactions   Cephalexin Palpitations   Ciprofloxacin Palpitations   Other Itching   Meperidine Hcl     Other reaction(s): Other Doesn't work   Sulfa Antibiotics Nausea And Vomiting   Cefdinir Nausea Only   Doxycycline Nausea Only    Consultations: None    Procedures/Studies: US RENAL  Result Date: 06/26/2022 CLINICAL DATA:  Acute kidney injury. EXAM: RENAL / URINARY TRACT ULTRASOUND COMPLETE COMPARISON:  CT scan 11/04/2021 FINDINGS: Right Kidney: Renal measurements: 9.3 x 3.9 x 4.3 cm = volume: 80 mL. 3 cysts identified measuring up to 1.5 cm, similar to prior CT. No hydronephrosis. Increased echogenicity Left Kidney: Renal measurements: 8.7 x 3.7 x 3.8 cm = volume: 63 mL. Increased echogenicity. No hydronephrosis. Bladder: Appears normal for degree of bladder distention. Other: None. IMPRESSION: 1. No hydronephrosis. 2. Increased echogenicity of the renal parenchyma bilaterally consistent with medical renal disease. Electronically Signed   By: Kennith CenterEric  Mansell M.D.   On: 06/26/2022 05:45   CT Head Wo Contrast  Result Date: 06/26/2022 CLINICAL DATA:  Recent fall with headaches, initial encounter EXAM: CT HEAD WITHOUT CONTRAST TECHNIQUE: Contiguous axial images were obtained from the base of the skull through the vertex without intravenous contrast. RADIATION DOSE REDUCTION: This exam was performed according to the departmental dose-optimization program which includes automated exposure control, adjustment of the mA and/or kV according to patient size and/or use of iterative reconstruction technique. COMPARISON:  11/04/2021 FINDINGS: Brain: No evidence of acute infarction, hemorrhage, hydrocephalus,  extra-axial collection or mass lesion/mass effect. Chronic atrophic and ischemic changes are noted. Vascular: No hyperdense vessel or unexpected calcification. Skull: Normal. Negative for fracture or focal lesion. Sinuses/Orbits: No acute finding. Other: None. IMPRESSION: Chronic changes without acute abnormality. Electronically Signed   By: Alcide CleverMark  Lukens M.D.   On: 06/26/2022 03:10      Subjective: Patient seen and examined at bedside.  Poor historian.  No fever, vomiting, seizures reported.  Feels weak.  Discharge Exam: Vitals:   06/30/22 2110 07/01/22 0539  BP: (!) 174/73 134/61  Pulse: 70 67  Resp: 18 17  Temp: (!) 97.4 F (36.3 C) 97.7 F (36.5 C)  SpO2: 99% 98%    General: Pt is alert, awake, not in acute distress.  Looks chronically ill and deconditioned.  Currently on room air.  Slow to respond.  Flat affect.  Poor historian. Cardiovascular: rate controlled, S1/S2 + Respiratory: bilateral decreased breath sounds at bases Abdominal: Soft, NT, ND, bowel sounds + Extremities: no edema, no cyanosis    The results of significant diagnostics from this hospitalization (including imaging,  microbiology, ancillary and laboratory) are listed below for reference.     Microbiology: Recent Results (from the past 240 hour(s))  Urine Culture     Status: Abnormal   Collection Time: 06/25/22  9:51 PM   Specimen: Urine, Clean Catch  Result Value Ref Range Status   Specimen Description URINE, CLEAN CATCH  Final   Special Requests   Final    NONE Performed at [facility], [address], [place], [place] [address]    Culture >=100,000 COLONIES/mL ESCHERICHIA COLI (A)  Final   Report Status 06/28/2022 FINAL  Final   Organism ID, Bacteria ESCHERICHIA COLI (A)  Final      Susceptibility   Escherichia coli - MIC*    AMPICILLIN <=2 SENSITIVE Sensitive     CEFAZOLIN <=4 SENSITIVE Sensitive     CEFEPIME <=0.12 SENSITIVE Sensitive     CEFTRIAXONE <=0.25 SENSITIVE Sensitive      CIPROFLOXACIN <=0.25 SENSITIVE Sensitive     GENTAMICIN <=1 SENSITIVE Sensitive     IMIPENEM <=0.25 SENSITIVE Sensitive     NITROFURANTOIN <=16 SENSITIVE Sensitive     TRIMETH/SULFA <=20 SENSITIVE Sensitive     AMPICILLIN/SULBACTAM <=2 SENSITIVE Sensitive     PIP/TAZO <=4 SENSITIVE Sensitive     * >=100,000 COLONIES/mL ESCHERICHIA COLI     Labs: BNP (last 3 results) Recent Labs    11/03/21 2244  BNP 386.4*   Basic Metabolic Panel: Recent Labs  Lab 06/25/22 2155 06/26/22 0535 06/27/22 1133 06/28/22 0407  NA 136 137 135 136  K 4.0 4.2 3.5 3.6  CL 101 104 103 102  CO2 18* 14* 21* 23  GLUCOSE 96 109* 145* 117*  BUN 35* 32* 27* 19  CREATININE 1.88* 1.89* 1.33* 1.02*  CALCIUM 8.7* 8.2* 8.1* 7.9*   Liver Function Tests: No results for input(s): "AST", "ALT", "ALKPHOS", "BILITOT", "PROT", "ALBUMIN" in the last 168 hours. No results for input(s): "LIPASE", "AMYLASE" in the last 168 hours. No results for input(s): "AMMONIA" in the last 168 hours. CBC: Recent Labs  Lab 06/25/22 2155 06/26/22 0535 06/27/22 1133  WBC 16.1* 12.5* 9.9  HGB 12.7 12.1 12.4  HCT 37.2 36.0 36.0  MCV 108.1* 106.5* 104.0*  PLT 180 154 132*   Cardiac Enzymes: No results for input(s): "CKTOTAL", "CKMB", "CKMBINDEX", "TROPONINI" in the last 168 hours. BNP: Invalid input(s): "POCBNP" CBG: No results for input(s): "GLUCAP" in the last 168 hours. D-Dimer No results for input(s): "DDIMER" in the last 72 hours. Hgb A1c No results for input(s): "HGBA1C" in the last 72 hours. Lipid Profile No results for input(s): "CHOL", "HDL", "LDLCALC", "TRIG", "CHOLHDL", "LDLDIRECT" in the last 72 hours. Thyroid function studies No results for input(s): "TSH", "T4TOTAL", "T3FREE", "THYROIDAB" in the last 72 hours.  Invalid input(s): "FREET3" Anemia work up No results for input(s): "VITAMINB12", "FOLATE", "FERRITIN", "TIBC", "IRON", "RETICCTPCT" in the last 72 hours. Urinalysis    Component Value Date/Time    COLORURINE YELLOW 06/25/2022 2151   APPEARANCEUR CLOUDY (A) 06/25/2022 2151   LABSPEC 1.013 06/25/2022 2151   PHURINE 5.0 06/25/2022 2151   GLUCOSEU NEGATIVE 06/25/2022 2151   HGBUR MODERATE (A) 06/25/2022 2151   BILIRUBINUR NEGATIVE 06/25/2022 2151   KETONESUR 20 (A) 06/25/2022 2151   PROTEINUR 100 (A) 06/25/2022 2151   NITRITE NEGATIVE 06/25/2022 2151   LEUKOCYTESUR MODERATE (A) 06/25/2022 2151   Sepsis Labs Recent Labs  Lab 06/25/22 2155 06/26/22 0535 06/27/22 1133  WBC 16.1* 12.5* 9.9   Microbiology Recent Results (from the past 240 hour(s))  Urine Culture     Status: Abnormal   Collection Time: 06/25/22  9:51 PM   Specimen: Urine, Clean Catch  Result Value Ref Range Status   Specimen Description URINE, CLEAN CATCH  Final   Special Requests   Final    NONE Performed at [facility], [address] [place], [place] [address]    Culture >=100,000 COLONIES/mL ESCHERICHIA COLI (A)  Final   Report Status 06/28/2022 FINAL  Final   Organism ID, Bacteria ESCHERICHIA COLI (A)  Final      Susceptibility   Escherichia coli - MIC*    AMPICILLIN <=2 SENSITIVE Sensitive     CEFAZOLIN <=4 SENSITIVE Sensitive     CEFEPIME <=0.12 SENSITIVE Sensitive     CEFTRIAXONE <=0.25 SENSITIVE Sensitive     CIPROFLOXACIN <=0.25 SENSITIVE Sensitive     GENTAMICIN <=1 SENSITIVE Sensitive     IMIPENEM <=0.25 SENSITIVE Sensitive     NITROFURANTOIN <=16 SENSITIVE Sensitive     TRIMETH/SULFA <=20 SENSITIVE Sensitive     AMPICILLIN/SULBACTAM <=2 SENSITIVE Sensitive     PIP/TAZO <=4 SENSITIVE Sensitive     * >=100,000 COLONIES/mL ESCHERICHIA COLI     Time coordinating discharge: 35 minutes  SIGNED:   Glade Lloyd, MD  [facility] 07/01/2022, 8:45 AM

## 2022-07-02 DIAGNOSIS — J441 Chronic obstructive pulmonary disease with (acute) exacerbation: Secondary | ICD-10-CM | POA: Diagnosis not present

## 2022-07-02 DIAGNOSIS — G64 Other disorders of peripheral nervous system: Secondary | ICD-10-CM | POA: Diagnosis not present

## 2022-07-02 DIAGNOSIS — J45909 Unspecified asthma, uncomplicated: Secondary | ICD-10-CM | POA: Diagnosis not present

## 2022-07-02 DIAGNOSIS — I10 Essential (primary) hypertension: Secondary | ICD-10-CM | POA: Diagnosis not present

## 2022-07-02 DIAGNOSIS — N390 Urinary tract infection, site not specified: Secondary | ICD-10-CM | POA: Diagnosis not present

## 2022-07-02 DIAGNOSIS — R531 Weakness: Secondary | ICD-10-CM | POA: Diagnosis not present

## 2022-07-02 DIAGNOSIS — E785 Hyperlipidemia, unspecified: Secondary | ICD-10-CM | POA: Diagnosis not present

## 2022-07-02 DIAGNOSIS — M6281 Muscle weakness (generalized): Secondary | ICD-10-CM | POA: Diagnosis not present

## 2022-07-02 DIAGNOSIS — Z9181 History of falling: Secondary | ICD-10-CM | POA: Diagnosis not present

## 2022-07-02 DIAGNOSIS — K2100 Gastro-esophageal reflux disease with esophagitis, without bleeding: Secondary | ICD-10-CM | POA: Diagnosis not present

## 2022-07-05 DIAGNOSIS — K2100 Gastro-esophageal reflux disease with esophagitis, without bleeding: Secondary | ICD-10-CM | POA: Diagnosis not present

## 2022-07-05 DIAGNOSIS — N178 Other acute kidney failure: Secondary | ICD-10-CM | POA: Diagnosis not present

## 2022-07-05 DIAGNOSIS — I10 Essential (primary) hypertension: Secondary | ICD-10-CM | POA: Diagnosis not present

## 2022-07-05 DIAGNOSIS — J449 Chronic obstructive pulmonary disease, unspecified: Secondary | ICD-10-CM | POA: Diagnosis not present

## 2022-07-05 DIAGNOSIS — M6281 Muscle weakness (generalized): Secondary | ICD-10-CM | POA: Diagnosis not present

## 2022-07-05 DIAGNOSIS — J4520 Mild intermittent asthma, uncomplicated: Secondary | ICD-10-CM | POA: Diagnosis not present

## 2022-07-05 DIAGNOSIS — K588 Other irritable bowel syndrome: Secondary | ICD-10-CM | POA: Diagnosis not present

## 2022-07-05 DIAGNOSIS — N390 Urinary tract infection, site not specified: Secondary | ICD-10-CM | POA: Diagnosis not present

## 2022-07-06 DIAGNOSIS — R531 Weakness: Secondary | ICD-10-CM | POA: Diagnosis not present

## 2022-07-06 DIAGNOSIS — K2100 Gastro-esophageal reflux disease with esophagitis, without bleeding: Secondary | ICD-10-CM | POA: Diagnosis not present

## 2022-07-06 DIAGNOSIS — Z9181 History of falling: Secondary | ICD-10-CM | POA: Diagnosis not present

## 2022-07-06 DIAGNOSIS — N390 Urinary tract infection, site not specified: Secondary | ICD-10-CM | POA: Diagnosis not present

## 2022-07-06 DIAGNOSIS — J45909 Unspecified asthma, uncomplicated: Secondary | ICD-10-CM | POA: Diagnosis not present

## 2022-07-06 DIAGNOSIS — G64 Other disorders of peripheral nervous system: Secondary | ICD-10-CM | POA: Diagnosis not present

## 2022-07-06 DIAGNOSIS — E785 Hyperlipidemia, unspecified: Secondary | ICD-10-CM | POA: Diagnosis not present

## 2022-07-06 DIAGNOSIS — I10 Essential (primary) hypertension: Secondary | ICD-10-CM | POA: Diagnosis not present

## 2022-07-06 DIAGNOSIS — M6281 Muscle weakness (generalized): Secondary | ICD-10-CM | POA: Diagnosis not present

## 2022-07-06 DIAGNOSIS — J441 Chronic obstructive pulmonary disease with (acute) exacerbation: Secondary | ICD-10-CM | POA: Diagnosis not present

## 2022-07-07 DIAGNOSIS — J441 Chronic obstructive pulmonary disease with (acute) exacerbation: Secondary | ICD-10-CM | POA: Diagnosis not present

## 2022-07-07 DIAGNOSIS — M6281 Muscle weakness (generalized): Secondary | ICD-10-CM | POA: Diagnosis not present

## 2022-07-07 DIAGNOSIS — K2100 Gastro-esophageal reflux disease with esophagitis, without bleeding: Secondary | ICD-10-CM | POA: Diagnosis not present

## 2022-07-07 DIAGNOSIS — N390 Urinary tract infection, site not specified: Secondary | ICD-10-CM | POA: Diagnosis not present

## 2022-07-07 DIAGNOSIS — I10 Essential (primary) hypertension: Secondary | ICD-10-CM | POA: Diagnosis not present

## 2022-07-09 DIAGNOSIS — I10 Essential (primary) hypertension: Secondary | ICD-10-CM | POA: Diagnosis not present

## 2022-07-09 DIAGNOSIS — J45909 Unspecified asthma, uncomplicated: Secondary | ICD-10-CM | POA: Diagnosis not present

## 2022-07-09 DIAGNOSIS — M6281 Muscle weakness (generalized): Secondary | ICD-10-CM | POA: Diagnosis not present

## 2022-07-09 DIAGNOSIS — K2100 Gastro-esophageal reflux disease with esophagitis, without bleeding: Secondary | ICD-10-CM | POA: Diagnosis not present

## 2022-07-09 DIAGNOSIS — E785 Hyperlipidemia, unspecified: Secondary | ICD-10-CM | POA: Diagnosis not present

## 2022-07-09 DIAGNOSIS — G64 Other disorders of peripheral nervous system: Secondary | ICD-10-CM | POA: Diagnosis not present

## 2022-07-09 DIAGNOSIS — N390 Urinary tract infection, site not specified: Secondary | ICD-10-CM | POA: Diagnosis not present

## 2022-07-09 DIAGNOSIS — J441 Chronic obstructive pulmonary disease with (acute) exacerbation: Secondary | ICD-10-CM | POA: Diagnosis not present

## 2022-07-09 DIAGNOSIS — R531 Weakness: Secondary | ICD-10-CM | POA: Diagnosis not present

## 2022-07-09 DIAGNOSIS — Z9181 History of falling: Secondary | ICD-10-CM | POA: Diagnosis not present

## 2022-07-13 DIAGNOSIS — G64 Other disorders of peripheral nervous system: Secondary | ICD-10-CM | POA: Diagnosis not present

## 2022-07-13 DIAGNOSIS — Z9181 History of falling: Secondary | ICD-10-CM | POA: Diagnosis not present

## 2022-07-13 DIAGNOSIS — J45909 Unspecified asthma, uncomplicated: Secondary | ICD-10-CM | POA: Diagnosis not present

## 2022-07-13 DIAGNOSIS — I10 Essential (primary) hypertension: Secondary | ICD-10-CM | POA: Diagnosis not present

## 2022-07-13 DIAGNOSIS — N390 Urinary tract infection, site not specified: Secondary | ICD-10-CM | POA: Diagnosis not present

## 2022-07-13 DIAGNOSIS — R531 Weakness: Secondary | ICD-10-CM | POA: Diagnosis not present

## 2022-07-13 DIAGNOSIS — E785 Hyperlipidemia, unspecified: Secondary | ICD-10-CM | POA: Diagnosis not present

## 2022-07-14 DIAGNOSIS — K2100 Gastro-esophageal reflux disease with esophagitis, without bleeding: Secondary | ICD-10-CM | POA: Diagnosis not present

## 2022-07-14 DIAGNOSIS — M6281 Muscle weakness (generalized): Secondary | ICD-10-CM | POA: Diagnosis not present

## 2022-07-14 DIAGNOSIS — N390 Urinary tract infection, site not specified: Secondary | ICD-10-CM | POA: Diagnosis not present

## 2022-07-14 DIAGNOSIS — D649 Anemia, unspecified: Secondary | ICD-10-CM | POA: Diagnosis not present

## 2022-07-14 DIAGNOSIS — J441 Chronic obstructive pulmonary disease with (acute) exacerbation: Secondary | ICD-10-CM | POA: Diagnosis not present

## 2022-07-14 DIAGNOSIS — I10 Essential (primary) hypertension: Secondary | ICD-10-CM | POA: Diagnosis not present

## 2022-07-15 ENCOUNTER — Telehealth: Payer: Medicare Other | Admitting: Internal Medicine

## 2022-07-15 DIAGNOSIS — I10 Essential (primary) hypertension: Secondary | ICD-10-CM | POA: Diagnosis not present

## 2022-07-15 DIAGNOSIS — N390 Urinary tract infection, site not specified: Secondary | ICD-10-CM | POA: Diagnosis not present

## 2022-07-15 DIAGNOSIS — M6281 Muscle weakness (generalized): Secondary | ICD-10-CM | POA: Diagnosis not present

## 2022-07-15 DIAGNOSIS — R197 Diarrhea, unspecified: Secondary | ICD-10-CM | POA: Diagnosis not present

## 2022-07-15 DIAGNOSIS — D649 Anemia, unspecified: Secondary | ICD-10-CM | POA: Diagnosis not present

## 2022-07-15 DIAGNOSIS — J441 Chronic obstructive pulmonary disease with (acute) exacerbation: Secondary | ICD-10-CM | POA: Diagnosis not present

## 2022-07-15 DIAGNOSIS — K2100 Gastro-esophageal reflux disease with esophagitis, without bleeding: Secondary | ICD-10-CM | POA: Diagnosis not present

## 2022-07-15 NOTE — Telephone Encounter (Signed)
Pt's nephew has dropped off PW to get a handicap parking placard for the pt and it has been placed in Dr. Frutoso Chase box.   Upon completion please call Sharmon Revere:  [phone]

## 2022-07-16 DIAGNOSIS — G64 Other disorders of peripheral nervous system: Secondary | ICD-10-CM | POA: Diagnosis not present

## 2022-07-16 DIAGNOSIS — E785 Hyperlipidemia, unspecified: Secondary | ICD-10-CM | POA: Diagnosis not present

## 2022-07-16 DIAGNOSIS — Z9181 History of falling: Secondary | ICD-10-CM | POA: Diagnosis not present

## 2022-07-16 DIAGNOSIS — I10 Essential (primary) hypertension: Secondary | ICD-10-CM | POA: Diagnosis not present

## 2022-07-16 DIAGNOSIS — N390 Urinary tract infection, site not specified: Secondary | ICD-10-CM | POA: Diagnosis not present

## 2022-07-16 DIAGNOSIS — R531 Weakness: Secondary | ICD-10-CM | POA: Diagnosis not present

## 2022-07-16 DIAGNOSIS — J45909 Unspecified asthma, uncomplicated: Secondary | ICD-10-CM | POA: Diagnosis not present

## 2022-07-16 NOTE — Telephone Encounter (Signed)
This has been placed inside Dr Crawford's office box. 

## 2022-07-21 ENCOUNTER — Telehealth: Payer: Self-pay

## 2022-07-21 ENCOUNTER — Other Ambulatory Visit (HOSPITAL_COMMUNITY): Payer: Self-pay

## 2022-07-21 MED ORDER — MELATONIN 3 MG PO TABS: ORAL_TABLET | ORAL | 0 refills | Status: DC

## 2022-07-21 MED ORDER — ALBUTEROL SULFATE HFA 108 (90 BASE) MCG/ACT IN AERS
2.0000 | INHALATION_SPRAY | Freq: Four times a day (QID) | RESPIRATORY_TRACT | 0 refills | Status: DC | PRN
  Filled 2022-07-21: qty 6.7, 25d supply, fill #0

## 2022-07-21 MED ORDER — CLONAZEPAM 0.5 MG PO TABS
0.5000 mg | ORAL_TABLET | Freq: Two times a day (BID) | ORAL | 0 refills | Status: DC | PRN
  Filled 2022-07-21: qty 60, 30d supply, fill #0

## 2022-07-21 MED ORDER — LEVOCETIRIZINE DIHYDROCHLORIDE 5 MG PO TABS
5.0000 mg | ORAL_TABLET | Freq: Every day | ORAL | 0 refills | Status: DC
  Filled 2022-09-10: qty 30, 30d supply, fill #0

## 2022-07-21 MED ORDER — ONDANSETRON 4 MG PO TBDP
4.0000 mg | ORAL_TABLET | Freq: Four times a day (QID) | ORAL | 0 refills | Status: DC | PRN
  Filled 2022-07-21: qty 120, 30d supply, fill #0

## 2022-07-21 MED ORDER — PROPRANOLOL HCL ER 60 MG PO CP24
60.0000 mg | ORAL_CAPSULE | Freq: Every day | ORAL | 0 refills | Status: DC
  Filled 2022-07-21: qty 30, 30d supply, fill #0

## 2022-07-21 MED ORDER — BUDESONIDE-FORMOTEROL FUMARATE 160-4.5 MCG/ACT IN AERO
2.0000 | INHALATION_SPRAY | Freq: Two times a day (BID) | RESPIRATORY_TRACT | 0 refills | Status: DC
  Filled 2022-07-21: qty 10.2, 30d supply, fill #0

## 2022-07-21 MED ORDER — PANTOPRAZOLE SODIUM 40 MG PO TBEC
40.0000 mg | DELAYED_RELEASE_TABLET | Freq: Two times a day (BID) | ORAL | 0 refills | Status: DC
  Filled 2022-07-21: qty 60, 30d supply, fill #0

## 2022-07-21 MED ORDER — ROSUVASTATIN CALCIUM 5 MG PO TABS
5.0000 mg | ORAL_TABLET | Freq: Every day | ORAL | 0 refills | Status: DC
  Filled 2022-09-10: qty 30, 30d supply, fill #0

## 2022-07-21 MED ORDER — GABAPENTIN 300 MG PO CAPS
300.0000 mg | ORAL_CAPSULE | Freq: Every day | ORAL | 0 refills | Status: DC
  Filled 2022-07-21 – 2022-09-20 (×2): qty 30, 30d supply, fill #0

## 2022-07-21 MED ORDER — ACETAMINOPHEN EXTRA STRENGTH 500 MG PO TABS: 500.0000 mg | ORAL_TABLET | Freq: Every day | ORAL | 0 refills | Status: AC | PRN

## 2022-07-21 MED ORDER — LOPERAMIDE HCL 2 MG PO TABS: ORAL_TABLET | ORAL | 0 refills | Status: DC

## 2022-07-21 NOTE — Telephone Encounter (Signed)
Patient Disability place card is up front

## 2022-07-22 ENCOUNTER — Other Ambulatory Visit: Payer: Self-pay | Admitting: *Deleted

## 2022-07-22 ENCOUNTER — Other Ambulatory Visit (HOSPITAL_COMMUNITY): Payer: Self-pay

## 2022-07-22 DIAGNOSIS — I10 Essential (primary) hypertension: Secondary | ICD-10-CM

## 2022-07-22 NOTE — Patient Outreach (Signed)
[place] Coordinator follow up. Verified in [facility] Mrs. Italiano discharged from [facility] on 07/21/22. Assessing for potential [facility] care coordination needs as benefit of insurance plan and Primary Care Provider.   Telephone call made to nephew/HCPOA/DPR [facility] [phone] to discuss [facility] follow up. No answer HIPAA compliant voicemail message left requesting return call.   Will follow up with [facility] skilled nursing social worker to inquire about home health health arrangements.   Marthenia Rolling, MSN, RN,BSN [place] Acute Care Coordinator [phone] (Direct dial)

## 2022-07-22 NOTE — Patient Outreach (Signed)
Received returned telephone call from [facility] (nephew/DPR/HPOA). Patient identifiers confirmed. Mr. Tyler Pita confirms Mrs. Jamroz discharged from [facility] skilled nursing facility on yesterday 07/21/22. States [facility] home health was arranged.   Explained [facility] care coordination follow up. Mr. Tyler Pita is agreeable. Confirms Tina Riley lives alone. However, he should be contacted for post discharge calls [phone].  Confirms PCP appointment has been scheduled for 08/02/22.   Will make referral to [facility] care coordination team for RN CM.   Mrs. Fundora has history of falls, COPD, UTI, HTN.   Marthenia Rolling, MSN, RN,BSN [place] Acute Care Coordinator [phone] (Direct dial)

## 2022-07-23 ENCOUNTER — Ambulatory Visit: Payer: Medicare Other | Admitting: Internal Medicine

## 2022-07-23 ENCOUNTER — Telehealth: Payer: Self-pay | Admitting: *Deleted

## 2022-07-23 NOTE — Progress Notes (Signed)
  Care Coordination   Note   07/23/2022 Name: Tina Riley MRN: [id number] DOB: 1932-05-22  Tina Riley is a 89 y.o. year old female who sees Hoyt Koch, MD for primary care. I reached out to [place] by phone today to offer care coordination services.  [name] was given information about Care Coordination services today including:   The Care Coordination services include support from the care team which includes your Nurse Coordinator, Clinical Social Worker, or Pharmacist.  The Care Coordination team is here to help remove barriers to the health concerns and goals most important to you. Care Coordination services are voluntary, and the patient may decline or stop services at any time by request to their care team member.   Care Coordination Consent Status: Patient agreed to services and verbal consent obtained.   Follow up plan:  Telephone appointment with care coordination team member scheduled for:  07/28/2022  Encounter Outcome:  Pt. Scheduled from referral   Julian Hy, [place] Direct Dial: [phone]

## 2022-07-28 ENCOUNTER — Ambulatory Visit: Payer: Self-pay

## 2022-07-28 DIAGNOSIS — Z139 Encounter for screening, unspecified: Secondary | ICD-10-CM

## 2022-07-28 NOTE — Patient Outreach (Signed)
  Care Coordination   Initial Visit Note   07/28/2022 Name: Tina Riley MRN: [id number] DOB: 08-Jun-1932  Tina Riley is a 89 y.o. year old female who sees Hoyt Koch, MD for primary care. I spoke with  Tina Riley by phone today.  What matters to the patients health and wellness today?  Tina Riley with recent discharge from [facility] on 07/21/22. Patient lives alone. Has supportive nephew that assist with managing Tina Riley's care. Tina Riley reports someone from [place] is supposed to start today, but states she has not seen anyone. She also reports that home health is supposed to see her and expressed that physical therapy is supposed to come out on 1/26. She expresses that she was independent and doing for herself prior to hospitalization, but expresses limited ability to do things like bath due to weakness. Tina Riley reports she lives alone, does not have anyone to talk to and acknowledges feelings of loneliness and would like to increase socialization.   Goals Addressed             This Visit's Progress    Health Management       Care Coordination Interventions: Evaluation of current treatment plan related to recent discharge from SNF rehab and patient's adherence to plan as established by provider Discussed patient's health status with patient Discussed insurance benefit to receive meals. Patient declines adding she has had and does not like the meals. She reports she has meals on wheels and enjoys those meals. Discussed signs/symptoms of UTI Discussed fall prevention Contacted [facility](confirmed that nurse scheduled for assessment made visit today) Contacted [facility](confirmed nurse completed initial visit on yesterday, PT scheduled for Friday and OT scheduled to visit this week, nurse will make another visit next week. Patient called and provided this updated information  Reviewed upcoming appointments and confirmed patient has  transportation to appointment Referral to care guide re: socialization resources       SDOH assessments and interventions completed:  Yes  SDOH Interventions Today    Flowsheet Row Most Recent Value  SDOH Interventions   Food Insecurity Interventions Intervention Not Indicated  Housing Interventions Intervention Not Indicated  Transportation Interventions Intervention Not Indicated  Utilities Interventions Intervention Not Indicated     Care Coordination Interventions:  Yes, provided   Follow up plan: Follow up call scheduled for 08/04/22    Encounter Outcome:  Pt. Visit Completed   [name], RN, MSN, BSN, [place] Coordinator [phone]

## 2022-07-28 NOTE — Patient Instructions (Signed)
Visit Information  Thank you for taking time to visit with me today. Please don't hesitate to contact me if I can be of assistance to you.   Following are the goals we discussed today:   Goals Addressed             This Visit's Progress    Health Management       Care Coordination Interventions: Evaluation of current treatment plan related to recent discharge from SNF rehab and patient's adherence to plan as established by provider Discussed patient's health status with patient Discussed insurance benefit to receive meals. Patient declines adding she has had and does not like the meals. She reports she has meals on wheels and enjoys those meals. Discussed signs/symptoms of UTI Discussed fall prevention Contacted [facility](confirmed that nurse scheduled for assessment made visit today) Contacted [facility](confirmed nurse completed initial visit on yesterday, PT scheduled for Friday and OT scheduled to visit this week, nurse will make another visit next week. Patient called and provided this updated information  Reviewed upcoming appointments and confirmed patient has transportation to appointment Referral to care guide re: socialization resources       Our next appointment is by telephone on 08/04/22 at 1:30 pm  Please call the care guide team at [phone] if you need to cancel or reschedule your appointment.   If you are experiencing a Mental Health or [place] or need someone to talk to, please call the Suicide and Crisis Lifeline: [place], RN, MSN, BSN, [place] [phone]

## 2022-07-29 ENCOUNTER — Telehealth: Payer: Self-pay

## 2022-07-29 NOTE — Patient Outreach (Signed)
  Care Coordination   Follow Up Visit Note   07/29/2022 Name: Tina Riley MRN: [id number] DOB: 01-20-1932  Tina Riley is a 89 y.o. year old female who sees Tina Koch, MD for primary care. I spoke with  Tina Riley by phone today.  What matters to the patients health and wellness today?  RNCM received voice message from patient requesting return call regarding home health therapy. [name]. Reiterated information provided on yesterday that therapist would make visit on Friday. Provided [facility] contact number again. Patient to call as needed.   Goals Addressed             This Visit's Progress    Health Management       Care Coordination Interventions: Returned call to patient. Tina Riley states she is clear now on home health therapy appointment. RNCM confirmed she has [facility] number contact number [phone]       SDOH assessments and interventions completed:  No  Care Coordination Interventions:  Yes, provided   Follow up plan:  as previously scheduled    Encounter Outcome:  Pt. Visit Completed   Tina Silversmith, RN, MSN, BSN, [place] Coordinator [phone]

## 2022-08-02 ENCOUNTER — Encounter: Payer: Self-pay | Admitting: Internal Medicine

## 2022-08-02 ENCOUNTER — Ambulatory Visit (INDEPENDENT_AMBULATORY_CARE_PROVIDER_SITE_OTHER): Payer: Medicare Other | Admitting: Internal Medicine

## 2022-08-02 VITALS — BP 160/80 | HR 60 | Temp 97.7°F

## 2022-08-02 DIAGNOSIS — N3000 Acute cystitis without hematuria: Secondary | ICD-10-CM

## 2022-08-02 DIAGNOSIS — R5383 Other fatigue: Secondary | ICD-10-CM

## 2022-08-02 DIAGNOSIS — N179 Acute kidney failure, unspecified: Secondary | ICD-10-CM

## 2022-08-02 DIAGNOSIS — E440 Moderate protein-calorie malnutrition: Secondary | ICD-10-CM | POA: Diagnosis not present

## 2022-08-02 DIAGNOSIS — F419 Anxiety disorder, unspecified: Secondary | ICD-10-CM

## 2022-08-02 MED ORDER — CYANOCOBALAMIN 1000 MCG/ML IJ SOLN
1000.0000 ug | Freq: Once | INTRAMUSCULAR | Status: AC
  Administered 2022-08-02: 1000 ug via INTRAMUSCULAR

## 2022-08-02 NOTE — Progress Notes (Signed)
   Subjective:   Patient ID: Tina Riley, female    DOB: 29-Oct-1931, 89 y.o.   MRN: [id number]  HPI The patient is an 89 YO female coming in for post discharge from rehab facility after hospital admission (for pyelonephritis, given antibiotics). She is with nephew and is doing okay. Still very tired. HHPT has not started yet. Eating okay. Sleeping okay  PMH, [facility], social history reviewed and updated  Review of Systems  Constitutional:  Positive for activity change, appetite change and fatigue.  HENT: Negative.    Eyes: Negative.   Respiratory:  Negative for cough, chest tightness and shortness of breath.   Cardiovascular:  Negative for chest pain, palpitations and leg swelling.  Gastrointestinal:  Negative for abdominal distention, abdominal pain, constipation, diarrhea, nausea and vomiting.  Musculoskeletal: Negative.   Skin: Negative.   Neurological: Negative.   Psychiatric/Behavioral:  The patient is nervous/anxious.     Objective:  Physical Exam Constitutional:      Appearance: She is well-developed.     Comments: thin  HENT:     Head: Normocephalic and atraumatic.  Cardiovascular:     Rate and Rhythm: Normal rate and regular rhythm.  Pulmonary:     Effort: Pulmonary effort is normal. No respiratory distress.     Breath sounds: Normal breath sounds. No wheezing or rales.  Abdominal:     General: Bowel sounds are normal. There is no distension.     Palpations: Abdomen is soft.     Tenderness: There is no abdominal tenderness. There is no rebound.  Musculoskeletal:     Cervical back: Normal range of motion.  Skin:    General: Skin is warm and dry.  Neurological:     Mental Status: She is alert and oriented to person, place, and time.     Coordination: Coordination abnormal.     Comments: Wheelchair for ambulation long distances     Vitals:   08/02/22 1446 08/02/22 1450  BP: (!) 160/80 (!) 160/80  Pulse: 60   Temp: 97.7 F (36.5 C)   TempSrc: Oral    SpO2: 97%    Visit time 25 minutes in face to face communication with patient and coordination of care, additional 10 minutes spent in record review, coordination or care, ordering tests, communicating/referring to other healthcare professionals, documenting in medical records all on the same day of the visit for total time 35 minutes spent on the visit.   Assessment & Plan:  B12 shot given at visit

## 2022-08-02 NOTE — Assessment & Plan Note (Signed)
Suspect some nutritional deficiencies and given B12 shot to help with energy and recovery after recent illness.

## 2022-08-02 NOTE — Assessment & Plan Note (Signed)
She is eating meals on wheels which has helped her. Her oral intake is not great and she is underweight at 95 pounds.

## 2022-08-02 NOTE — Assessment & Plan Note (Signed)
Overall stable but change to health gradually in last few years has had an impact on her mental health. She uses clonazepam 0.5 mg BID prn and this is overall okay control. Will continue.

## 2022-08-02 NOTE — Assessment & Plan Note (Signed)
Resolved prior to discharge will not draw labs today. Encouraged good oral intake.

## 2022-08-02 NOTE — Assessment & Plan Note (Signed)
Resolved now and no symptoms of recurrence.

## 2022-08-02 NOTE — Patient Instructions (Addendum)
Dr. Prudencio Burly for the eyes you can call to schedule. We have given you the B12 shot today.

## 2022-08-04 ENCOUNTER — Ambulatory Visit: Payer: Self-pay

## 2022-08-04 NOTE — Patient Outreach (Signed)
  Care Coordination   Follow Up Visit Note   08/04/2022 Name: Tanisia Riley MRN: [id number] DOB: 01/17/1932  Tina Riley is a 89 y.o. year old female who sees Tina Koch, MD for primary care. I spoke with  Tina Riley by phone today.  What matters to the patients health and wellness today?  Patient report's she is doing alright, but has concerns that the heat is not working because she has not heard the heat coming on today. She reports she is active with home health.   Goals Addressed             This Visit's Progress    Health Management       Care Coordination Interventions: Call to [facility] regarding start of care and services. RNCM spoke with Tina Riley who reports start of care date 07/27/22 with the nurse and seen by nurse on 08/03/22. Physical therapy is scheduled to see patient three times/week-patient seen 08/04/22 and scheduled to be seen 08/06/22; OT scheduled once a week and seen 07/30/22 for evaluation and 08/03/22 RNCM confirmed that patient has contact information for management office. RNCM encouraged patient to contact office management to report. Tina Riley states she called and left message on answering machine. While on the phone, RNCM encouraged patient to go to thermostat control, patient increased the temperature and reported she could hear the heater come on.  RNCM called back and confirmed with patient that someone from the office had come to check on patients heater and she reports they explained to her how to use and it is working correctly.      SDOH assessments and interventions completed:  No  Care Coordination Interventions:  Yes, provided   Follow up plan: Follow up call scheduled for 08/19/22    Encounter Outcome:  Pt. Visit Completed   Tina Silversmith, RN, MSN, BSN, [place] Coordinator [phone]

## 2022-08-04 NOTE — Patient Instructions (Signed)
Visit Information  Thank you for taking time to visit with me today. Please don't hesitate to contact me if I can be of assistance to you.   Following are the goals we discussed today:   Goals Addressed             This Visit's Progress    Health Management       Care Coordination Interventions: Call to [facility] regarding start of care and services. RNCM spoke with Ceionno who reports start of care date 07/27/22 with the nurse and seen by nurse on 08/03/22. Physical therapy is scheduled to see patient three times/week-patient seen 08/04/22 and scheduled to be seen 08/06/22; OT scheduled once a week and seen 07/30/22 for evaluation and 08/03/22 RNCM confirmed that patient has contact information for management office. RNCM encouraged patient to contact office management to report. Ms. Stanzione states she called and left message on answering machine. While on the phone, RNCM encouraged patient to go to thermostat control, patient increased the temperature and reported she could hear the heater come on.  RNCM called back and confirmed with patient that someone from the office had come to check on patients heater and she reports they explained to her how to use and it is working correctly.      Our next appointment is by telephone on 08/19/22 at 2:30 pm  Please call the care guide team at [phone] if you need to cancel or reschedule your appointment.   If you are experiencing a Mental Health or [place] or need someone to talk to, please call the Suicide and Crisis Lifeline: [place], RN, MSN, BSN, [place] [phone]

## 2022-08-05 ENCOUNTER — Telehealth: Payer: Self-pay | Admitting: *Deleted

## 2022-08-05 NOTE — Telephone Encounter (Signed)
   Telephone encounter was:  Successful.  08/05/2022 Name: Tina Riley MRN: [id number] DOB: Dec 10, 1931  Tina Riley is a 89 y.o. year old female who is a primary care patient of Sharlet Salina Real Cons, MD . The community resource team was consulted for assistance with  socialization  Care guide performed the following interventions: Patient provided with information about care guide support team and interviewed to confirm resource needs.  Follow Up Plan:  No further follow up planned at this time. The patient has been provided with needed resources. [place] [phone] [address] , [place] [address] Email : [email or web address]

## 2022-08-13 DIAGNOSIS — D649 Anemia, unspecified: Secondary | ICD-10-CM

## 2022-08-13 DIAGNOSIS — F32A Depression, unspecified: Secondary | ICD-10-CM

## 2022-08-13 DIAGNOSIS — K589 Irritable bowel syndrome without diarrhea: Secondary | ICD-10-CM

## 2022-08-13 DIAGNOSIS — J441 Chronic obstructive pulmonary disease with (acute) exacerbation: Secondary | ICD-10-CM | POA: Diagnosis not present

## 2022-08-13 DIAGNOSIS — Z9181 History of falling: Secondary | ICD-10-CM

## 2022-08-13 DIAGNOSIS — N179 Acute kidney failure, unspecified: Secondary | ICD-10-CM | POA: Diagnosis not present

## 2022-08-13 DIAGNOSIS — I10 Essential (primary) hypertension: Secondary | ICD-10-CM | POA: Diagnosis not present

## 2022-08-13 DIAGNOSIS — M1990 Unspecified osteoarthritis, unspecified site: Secondary | ICD-10-CM

## 2022-08-13 DIAGNOSIS — M6281 Muscle weakness (generalized): Secondary | ICD-10-CM | POA: Diagnosis not present

## 2022-08-13 DIAGNOSIS — R41841 Cognitive communication deficit: Secondary | ICD-10-CM

## 2022-08-13 DIAGNOSIS — E46 Unspecified protein-calorie malnutrition: Secondary | ICD-10-CM

## 2022-08-13 DIAGNOSIS — J45909 Unspecified asthma, uncomplicated: Secondary | ICD-10-CM

## 2022-08-13 DIAGNOSIS — G609 Hereditary and idiopathic neuropathy, unspecified: Secondary | ICD-10-CM

## 2022-08-13 DIAGNOSIS — K2100 Gastro-esophageal reflux disease with esophagitis, without bleeding: Secondary | ICD-10-CM

## 2022-08-13 DIAGNOSIS — Z87440 Personal history of urinary (tract) infections: Secondary | ICD-10-CM

## 2022-08-16 ENCOUNTER — Ambulatory Visit: Payer: Medicare Other | Admitting: Internal Medicine

## 2022-08-18 ENCOUNTER — Telehealth: Payer: Self-pay

## 2022-08-19 ENCOUNTER — Ambulatory Visit: Payer: Self-pay

## 2022-08-19 NOTE — Telephone Encounter (Signed)
What is BP reading?

## 2022-08-19 NOTE — Patient Instructions (Signed)
Visit Information  Thank you for taking time to visit with me today. Please don't hesitate to contact me if I can be of assistance to you.   Following are the goals we discussed today:   Goals Addressed             This Visit's Progress    Health Management       Care Coordination Interventions: Confirmed patient continues to be active with home health   Reviewed upcoming/scheduled appointments       Our next appointment is by telephone on 09/01/22 at 1:00 pm  Please call the care guide team at [phone] if you need to cancel or reschedule your appointment.   If you are experiencing a Mental Health or [place] or need someone to talk to, please call the Suicide and Crisis Lifeline: [place], RN, MSN, BSN, [place] [phone]

## 2022-08-19 NOTE — Patient Outreach (Signed)
  Care Coordination   Follow Up Visit Note   08/19/2022 Name: Tina Riley MRN: [id number] DOB: 01-25-1932  Tina Riley is a 89 y.o. year old female who sees Tina Koch, MD for primary care. I spoke with  Tina Riley by phone today.  What matters to the patients health and wellness today?  Tina Riley she continues to be active with home health. She reports her nephew, Tina Riley, is taking her to her upcoming appointments and states her nephew coming to see her today. She is without questions or concerns at this time.    Goals Addressed             This Visit's Progress    Health Management       Care Coordination Interventions: Confirmed patient continues to be active with home health   Reviewed upcoming/scheduled appointments       SDOH assessments and interventions completed:  No  Care Coordination Interventions:  Yes, provided   Follow up plan: Follow up call scheduled for 09/01/22    Encounter Outcome:  Pt. Visit Completed   Tina Silversmith, RN, MSN, BSN, [place] Coordinator [phone]

## 2022-08-20 ENCOUNTER — Other Ambulatory Visit (HOSPITAL_COMMUNITY): Payer: Self-pay

## 2022-08-20 DIAGNOSIS — R41841 Cognitive communication deficit: Secondary | ICD-10-CM

## 2022-08-20 DIAGNOSIS — I10 Essential (primary) hypertension: Secondary | ICD-10-CM | POA: Diagnosis not present

## 2022-08-20 DIAGNOSIS — F32A Depression, unspecified: Secondary | ICD-10-CM

## 2022-08-20 DIAGNOSIS — E46 Unspecified protein-calorie malnutrition: Secondary | ICD-10-CM

## 2022-08-20 DIAGNOSIS — Z87440 Personal history of urinary (tract) infections: Secondary | ICD-10-CM

## 2022-08-20 DIAGNOSIS — D649 Anemia, unspecified: Secondary | ICD-10-CM

## 2022-08-20 DIAGNOSIS — K589 Irritable bowel syndrome without diarrhea: Secondary | ICD-10-CM

## 2022-08-20 DIAGNOSIS — M6281 Muscle weakness (generalized): Secondary | ICD-10-CM | POA: Diagnosis not present

## 2022-08-20 DIAGNOSIS — N179 Acute kidney failure, unspecified: Secondary | ICD-10-CM | POA: Diagnosis not present

## 2022-08-20 DIAGNOSIS — G609 Hereditary and idiopathic neuropathy, unspecified: Secondary | ICD-10-CM

## 2022-08-20 DIAGNOSIS — Z9181 History of falling: Secondary | ICD-10-CM

## 2022-08-20 DIAGNOSIS — J441 Chronic obstructive pulmonary disease with (acute) exacerbation: Secondary | ICD-10-CM | POA: Diagnosis not present

## 2022-08-20 DIAGNOSIS — M1990 Unspecified osteoarthritis, unspecified site: Secondary | ICD-10-CM

## 2022-08-20 DIAGNOSIS — J45909 Unspecified asthma, uncomplicated: Secondary | ICD-10-CM

## 2022-08-20 DIAGNOSIS — K2100 Gastro-esophageal reflux disease with esophagitis, without bleeding: Secondary | ICD-10-CM

## 2022-08-20 NOTE — Telephone Encounter (Signed)
Okay to use tylenol and monitor BP. If persistently high >150/100 call us back.

## 2022-08-23 NOTE — Telephone Encounter (Signed)
Left voicemail for Tina Riley home health nurse about giving ms Lerette tylenol and to call us back if her blood pressure is consistently high.

## 2022-08-24 ENCOUNTER — Ambulatory Visit: Payer: Medicare Other | Admitting: Internal Medicine

## 2022-08-24 ENCOUNTER — Encounter: Payer: Self-pay | Admitting: Internal Medicine

## 2022-08-24 VITALS — BP 130/82 | HR 60 | Ht 59.0 in | Wt 90.0 lb

## 2022-08-24 DIAGNOSIS — R634 Abnormal weight loss: Secondary | ICD-10-CM

## 2022-08-24 DIAGNOSIS — R197 Diarrhea, unspecified: Secondary | ICD-10-CM | POA: Diagnosis not present

## 2022-08-24 DIAGNOSIS — R1312 Dysphagia, oropharyngeal phase: Secondary | ICD-10-CM

## 2022-08-24 NOTE — Progress Notes (Signed)
Chief Complaint: Diarrhea  HPI : 89 year old female with history of asthma, IBS, GERD, oropharyngeal dysphagia, and peripheral neuropathy presents with diarrhea  Interval History: She was admitted in 06/2022 for a fall due to a UTI. Afterwards she was sent to a SNF as part of the recovery process. She has now been sent back home with home health PT. She was on Miralax at the SNF and she went back to the [facility] after she went home. She has a slight preference for the Miralax over the Linzess medication. She has been eating the prunes and raisins to help with her bowel habits. Denies abdominal pain. She has ordered Boost, which she liked in the hospital and at the SNF. Denies N&V. She is not swallowing right but she tries to chew carefully. She did get new dentures, which has helped. She has worked with a Astronomer in the past. She has tried to eat protein with some tuna and chicken.   Wt Readings from Last 3 Encounters:  08/24/22 90 lb (40.8 kg)  05/14/22 95 lb (43.1 kg)  05/14/22 95 lb 2 oz (43.1 kg)   Current Outpatient Medications  Medication Sig Dispense Refill   acetaminophen (TYLENOL) 500 MG tablet Take 500 mg by mouth daily as needed for headache or mild pain.     Acetaminophen Extra Strength 500 MG TABS Take 1 tablet (500 mg total) by mouth daily as needed for headache or mild pain. 30 tablet 0   albuterol (VENTOLIN HFA) 108 (90 Base) MCG/ACT inhaler Inhale 2 puffs into the lungs every 6 (six) hours as needed for wheezing or shortness of breath. 8.5 g 2   albuterol (VENTOLIN HFA) 108 (90 Base) MCG/ACT inhaler Inhale 2 puffs into the lungs every 6 (six) hours as needed for shortness of breath or wheezing. 6.7 g 0   budesonide-formoterol (SYMBICORT) 160-4.5 MCG/ACT inhaler Inhale 2 puffs into the lungs 2 (two) times daily. 10.2 g 0   clonazePAM (KLONOPIN) 0.5 MG tablet Take 1 tablet (0.5 mg total) by mouth 2 (two) times daily as needed for anxiety. 6 tablet 0   clonazePAM  (KLONOPIN) 0.5 MG tablet Take 1 tablet (0.5 mg total) by mouth 2 (two) times daily as needed for anxiety. 60 tablet 0   gabapentin (NEURONTIN) 300 MG capsule Take 1 capsule (300 mg total) by mouth at bedtime. 10 capsule 0   gabapentin (NEURONTIN) 300 MG capsule Take 1 capsule (300 mg total) by mouth at bedtime. 30 capsule 0   levocetirizine (XYZAL) 5 MG tablet Take 1 tablet (5 mg total) by mouth daily. 30 tablet 0   linaclotide (LINZESS) 145 MCG CAPS capsule Take 1 capsule (145 mcg total) by mouth daily before breakfast. 30 capsule 3   loperamide (IMODIUM A-D) 2 MG tablet Take 1 tablet by mouth three times a day as needed for loose stools 90 tablet 0   Melatonin 3 MG CAPS Take 1 capsule (3 mg total) by mouth at bedtime as needed (sleep).     melatonin 3 MG TABS tablet Take 1 tablet by mouth every night at bedtime as needed for insomnia 30 tablet 0   ondansetron (ZOFRAN) 4 MG tablet Take 1 tablet (4 mg total) by mouth every 6 (six) hours as needed for nausea. 20 tablet 0   ondansetron (ZOFRAN-ODT) 4 MG disintegrating tablet Take 1 tablet (4 mg total) by mouth every 6 (six) hours as needed for nausea. 120 tablet 0   pantoprazole (PROTONIX) 40 MG tablet Take  1 tablet (40 mg total) by mouth 2 (two) times daily. 180 tablet 3   pantoprazole (PROTONIX) 40 MG tablet Take 1 tablet (40 mg total) by mouth 2 (two) times daily. 60 tablet 0   Polyethyl Glycol-Propyl Glycol (SYSTANE OP) Place 1 drop into both eyes daily as needed (dry eyes).     propranolol ER (INDERAL LA) 60 MG 24 hr capsule Take 1 capsule (60 mg total) by mouth daily. 90 capsule 3   propranolol ER (INDERAL LA) 60 MG 24 hr capsule Take 1 capsule (60 mg total) by mouth daily. 30 capsule 0   psyllium (REGULOID) 0.52 g capsule Take 2 g by mouth at bedtime. 2 gummies (Metamucil)     rosuvastatin (CRESTOR) 5 MG tablet Take 1 tablet (5 mg total) by mouth at bedtime. 30 tablet 0   SYMBICORT 160-4.5 MCG/ACT inhaler Inhale 2 puffs into the lungs 2 (two)  times daily. 10.2 g 11   valACYclovir (VALTREX) 1000 MG tablet Take 1 tablet (1,000 mg total) by mouth daily. 90 tablet 3   No current facility-administered medications for this visit.   Physical Exam: BP 130/82   Pulse 60   Ht 4\' 11"  (1.499 m)   Wt 90 lb (40.8 kg)   BMI 18.18 kg/m  Constitutional: Pleasant,well-developed, female in no acute distress. HEENT: Normocephalic and atraumatic. Conjunctivae are normal. No scleral icterus. Cardiovascular: Normal rate Pulmonary/chest: Effort normal Abdominal: Soft, nondistended, nontender. Bowel sounds active throughout. There are no masses palpable. No hepatomegaly. Extremities: No edema Neurological: Alert and oriented to person place and time. Skin: Skin is warm and dry. No rashes noted. Psychiatric: Normal mood and affect. Behavior is normal.  Labs 07/2021: BMP with low sodium of 132 and elevated glucose of 242. CBC with stable Hb of 12.3.   Labs 10/2021: CBC unremarkable. CMP with low K of 2.8.   CT A/P w/contrast 07/28/21: IMPRESSION: 1. No acute intra-abdominal or pelvic pathology. 2. Severe sigmoid diverticulosis. No bowel obstruction. 3. Aortic Atherosclerosis (ICD10-I70.0).  CT A/P w/contrast 08/10/21: IMPRESSION: 1. Findings consistent with high-grade mechanical small bowel obstruction with transition point visualized at left abdominal surgical small bowel anastomosis. Negative for intramural air or free air. 2. Patient is status post partial colectomy. Diverticular disease of residual left colon without acute wall thickening 3. Status post cholecystectomy. Intra and extrahepatic biliary dilatation probably due to postsurgical change  CT C/A/P 11/04/21: IMPRESSION: 1. Ill-defined nodular and ground-glass opacities in the right lower lobe and right upper lobe, likely infectious/inflammatory. 2. 13 mm nodular density in the right lower lobe. Consider one of the following in 3 months for both low-risk and  high-risk individuals: (a) repeat chest CT, (b) follow-up PET-CT, or (c) tissue sampling. This recommendation follows the consensus statement: Guidelines for Management of Incidental Pulmonary Nodules Detected on CT Images: From the Fleischner Society 2017; Radiology 2017; 284:228-243. 3. No acute localizing process in the abdomen or pelvis. 4. Large amount of stool throughout the colon and rectum. No bowel obstruction. 5. Colonic diverticulosis. 6.  Aortic Atherosclerosis (ICD10-I70.0).  Barium swallow 11/05/21: IMPRESSION: 1. Presbyesophagus 2.  Bilateral pharyngoceles 3.  Stasis in piriform passes with prompted throat clearing. 4.  Esophagus proximally patulous.  KUB 01/12/22: IMPRESSION: No bowel obstruction or free air. Extensive bowel content identified in the colon.  ASSESSMENT AND PLAN:  Overflow diarrhea Weight loss History of SBO Oropharyngeal dysphagia Patient has dealt with overflow diarrhea due to underlying constipation as evidenced by CT and KUB in the past that showed significant  stool burden. She has been maintaining her BMs by taking either Linzess or Miralax. Since the patient prefers Miralax, then okay to switch her to Miralax therapy.  - Switch from the Linzess to the Miralax QD. Increase Miralax to BID if not having adequate BMs.  - Continue drinking 8 cups of water per day and aim to increase physical activity - Continue to eat a few prunes a day - Aim to eat at least 75% of meals - Start nutritional supplement Boosts 2-3 times pre day - RTC in 6 months  Christia Reading, MD  I spent 43 minutes of time, including in depth chart review, independent review of results as outlined above, communicating results with the patient directly, face-to-face time with the patient, coordinating care, ordering studies and medications as appropriate, and documentation.

## 2022-08-24 NOTE — Patient Instructions (Signed)
Stop Linzess.  Miralax- once daily, increase to twice daily if you are not passing 1 bowel movement a day  Continue prunes.  Aim to eat at least 75% of meals.  Drink 2-3 Boost drinks per day.  It was a pleasure to see you today!  Thank you for trusting me with your gastrointestinal care!

## 2022-09-01 ENCOUNTER — Ambulatory Visit: Payer: Self-pay

## 2022-09-01 NOTE — Patient Outreach (Signed)
  Care Coordination   Follow Up Visit Note   09/01/2022 Name: Tina Riley MRN: [id number] DOB: [birth year]  Tina Riley is a 89 y.o. year old female who sees Hoyt Koch, MD for primary care. I spoke with  Tina Riley by phone today.  What matters to the patients health and wellness today?  Discharge from [facility]. Patient reports she continues to improve. She continues to be mindful of fall precautions. She reports visit with gastrologist on 08/24/22 and reports weight loss. 90lb on 08/24/22 down from 95 lb on10/27/23.   Goals Addressed             This Visit's Progress    Health Management       Interventions Today    Flowsheet Row Most Recent Value  Chronic Disease   Chronic disease during today's visit Other  General Interventions   General Interventions Discussed/Reviewed General Interventions Discussed, Doctor Visits  [discussed importance of maintaining weights.  discussed scale to keep track of weigts. does not have scales reinforced she would need someone to help her to get on and off scales when she does weigh for saftey reasons. Patient acknowledged and states.]  Doctor Visits Discussed/Reviewed Doctor Visits Discussed, Doctor Visits Reviewed  [reviewed gastrologist visit. reviewed upcoming appointments]  Exercise Interventions   Exercise Discussed/Reviewed Assistive device use and maintanence  Education Interventions   Education Provided Provided Education  Provided Verbal Education On When to see the doctor, Nutrition, Other  [discussed the importance of maintaining a balance between constipation and diarrhea. patient reports she has this under control at this time.]  Nutrition Interventions   Nutrition Discussed/Reviewed Nutrition Discussed  [reiterated instructions per gastrologist(office visit 08/24/22).  discussed eating healthy proteins, salmon, fish, boost,]  Pharmacy Interventions   Pharmacy Dicussed/Reviewed Pharmacy Topics  Reviewed  [Patient denies any difficulty with medications]  Safety Interventions   Safety Discussed/Reviewed Safety Discussed  [discussed fall precations: encouraged slow change of position, encouraged to use assistive devices(walker/wheelchair), keep walkways clear, have good lighting.]           SDOH assessments and interventions completed:  No  Care Coordination Interventions:  Yes, provided   Follow up plan: Follow up call scheduled for 09/30/22    Encounter Outcome:  Pt. Visit Completed   [name], RN, MSN, BSN, [place] Coordinator [phone]

## 2022-09-01 NOTE — Patient Instructions (Signed)
Visit Information  Thank you for taking time to visit with me today. Please don't hesitate to contact me if I can be of assistance to you.   Following are the goals we discussed today:   Goals Addressed             This Visit's Progress    Health Management       Interventions Today    Flowsheet Row Most Recent Value  Chronic Disease   Chronic disease during today's visit Other  General Interventions   General Interventions Discussed/Reviewed General Interventions Discussed, Doctor Visits  [discussed importance of maintaining weights.  discussed scale to keep track of weigts. does not have scales reinforced she would need someone to help her to get on and off scales when she does weigh for saftey reasons. Patient acknowledged and states.]  Doctor Visits Discussed/Reviewed Doctor Visits Discussed, Doctor Visits Reviewed  [reviewed gastrologist visit. reviewed upcoming appointments]  Exercise Interventions   Exercise Discussed/Reviewed Assistive device use and maintanence  Education Interventions   Education Provided Provided Education  Provided Verbal Education On When to see the doctor, Nutrition, Other  [discussed the importance of maintaining a balance between constipation and diarrhea. patient reports she has this under control at this time.]  Nutrition Interventions   Nutrition Discussed/Reviewed Nutrition Discussed  [reiterated instructions per gastrologist(office visit 08/24/22).  discussed eating healthy proteins, salmon, fish, boost,]  Pharmacy Interventions   Pharmacy Dicussed/Reviewed Pharmacy Topics Reviewed  [Patient denies any difficulty with medications]  Safety Interventions   Safety Discussed/Reviewed Safety Discussed  [discussed fall precations: encouraged slow change of position, encouraged to use assistive devices(walker/wheelchair), keep walkways clear, have good lighting.]           Our next appointment is by telephone on 09/30/22 at 1:00 pm  Please call the  care guide team at [phone] if you need to cancel or reschedule your appointment.   If you are experiencing a Mental Health or [place] or need someone to talk to, please call the Suicide and Crisis Lifeline: [place], RN, MSN, BSN, [employer] [phone]

## 2022-09-10 ENCOUNTER — Other Ambulatory Visit: Payer: Self-pay

## 2022-09-10 ENCOUNTER — Other Ambulatory Visit (HOSPITAL_COMMUNITY): Payer: Self-pay

## 2022-09-16 ENCOUNTER — Other Ambulatory Visit (HOSPITAL_COMMUNITY): Payer: Self-pay

## 2022-09-17 ENCOUNTER — Other Ambulatory Visit (HOSPITAL_COMMUNITY): Payer: Self-pay

## 2022-09-20 ENCOUNTER — Encounter: Payer: Self-pay | Admitting: Internal Medicine

## 2022-09-20 ENCOUNTER — Ambulatory Visit (INDEPENDENT_AMBULATORY_CARE_PROVIDER_SITE_OTHER): Payer: Medicare Other | Admitting: Internal Medicine

## 2022-09-20 ENCOUNTER — Other Ambulatory Visit (HOSPITAL_COMMUNITY): Payer: Self-pay

## 2022-09-20 ENCOUNTER — Other Ambulatory Visit: Payer: Self-pay

## 2022-09-20 VITALS — BP 130/60 | HR 66 | Temp 97.8°F | Ht 59.0 in | Wt 92.0 lb

## 2022-09-20 DIAGNOSIS — F419 Anxiety disorder, unspecified: Secondary | ICD-10-CM | POA: Diagnosis not present

## 2022-09-20 DIAGNOSIS — K5900 Constipation, unspecified: Secondary | ICD-10-CM | POA: Diagnosis not present

## 2022-09-20 DIAGNOSIS — J4540 Moderate persistent asthma, uncomplicated: Secondary | ICD-10-CM | POA: Diagnosis not present

## 2022-09-20 DIAGNOSIS — R630 Anorexia: Secondary | ICD-10-CM

## 2022-09-20 NOTE — Progress Notes (Signed)
   Subjective:   Patient ID: Tina Riley, female    DOB: [birth year], 89 y.o.   MRN: [id number]  HPI The patient is an 89 YO female coming in for follow up. Overall stable. Not many stomach problems lately. Mood is stable and she thinks fine. Not sleeping well. Some pain under left arm which resolved mostly with 1 tylenol no clear trigger.   Review of Systems  Constitutional:  Positive for activity change, appetite change and fatigue.  HENT: Negative.    Eyes: Negative.   Respiratory:  Negative for cough, chest tightness and shortness of breath.   Cardiovascular:  Negative for chest pain, palpitations and leg swelling.  Gastrointestinal:  Negative for abdominal distention, abdominal pain, constipation, diarrhea, nausea and vomiting.  Musculoskeletal: Negative.   Skin: Negative.   Neurological: Negative.   Psychiatric/Behavioral:  Positive for sleep disturbance.     Objective:  Physical Exam Constitutional:      Appearance: She is well-developed. She is ill-appearing.  HENT:     Head: Normocephalic and atraumatic.  Cardiovascular:     Rate and Rhythm: Normal rate and regular rhythm.  Pulmonary:     Effort: Pulmonary effort is normal. No respiratory distress.     Breath sounds: Normal breath sounds. No wheezing or rales.  Abdominal:     General: Bowel sounds are normal. There is no distension.     Palpations: Abdomen is soft.     Tenderness: There is no abdominal tenderness. There is no rebound.  Musculoskeletal:     Cervical back: Normal range of motion.  Skin:    General: Skin is warm and dry.  Neurological:     Mental Status: She is alert and oriented to person, place, and time.     Coordination: Coordination abnormal.     Vitals:   09/20/22 1537  BP: 130/60  Pulse: 66  Temp: 97.8 F (36.6 C)  TempSrc: Oral  SpO2: 98%  Weight: 92 lb (41.7 kg)  Height: 4\' 11"  (1.499 m)    Assessment & Plan:  Visit time 25 minutes in face to face communication with  patient and coordination of care, additional 5 minutes spent in record review, coordination or care, ordering tests, communicating/referring to other healthcare professionals, documenting in medical records all on the same day of the visit for total time 30 minutes spent on the visit.

## 2022-09-21 NOTE — Assessment & Plan Note (Signed)
Overall stable and using miralax instead of linzess as this works better for her. No recent colon attacks.

## 2022-09-21 NOTE — Assessment & Plan Note (Signed)
No flare today. Uses albuterol and symbicort prn and does not need refill today.

## 2022-09-21 NOTE — Assessment & Plan Note (Signed)
Has gained slight weight since last time and appetite is stable. Uses ensure to get adequate nutrition.

## 2022-09-21 NOTE — Assessment & Plan Note (Signed)
Overall stable and using clonazepam 0.5 mg BID prn. No increase needed.

## 2022-09-30 ENCOUNTER — Ambulatory Visit: Payer: Self-pay

## 2022-09-30 NOTE — Patient Outreach (Signed)
  Care Coordination   Follow Up Visit Note   09/30/2022 Name: Tina Riley MRN: [id number] DOB: 1932/07/01  Tina Riley is a 89 y.o. year old female who sees Tina Koch, MD for primary care. I spoke with  Tina Riley by phone today.  What matters to the patients health and wellness today?  Tina Riley reports she took Miralax today and it is hurting her stomach some. She states she is not constipated and she denies diarrhea. She reports stools are soft, but states it is hurting her tomach today.  She  reports the Miralx is not working like the [employer]. In home aid with patient at this time. She denies any needs currently except she states she is resting today.  Goals Addressed             This Visit's Progress    Health Management       Interventions Today    Flowsheet Row Most Recent Value  Chronic Disease   Chronic disease during today's visit Other  [health managment post SNF stay]  General Interventions   General Interventions Discussed/Reviewed General Interventions Reviewed, Doctor Visits  Doctor Visits Discussed/Reviewed Doctor Visits Reviewed  [instructions per GI office reviewed with patient.]  Exercise Interventions   Exercise Discussed/Reviewed Physical Activity  [Advised to remain as active per provider recommedations]  Education Interventions   Education Provided Provided Education  Provided Verbal Education On Other  [facility] reviewed hos miralax works in the body drawing water into the colon and advised should be taken with recommended about of liquid/water. encouraged to stay hydrated. and contact provider if does not improve]  Nutrition Interventions   Nutrition Discussed/Reviewed Nutrition Reviewed, Supplmental nutrition  [encouraged to continue to eat healthy, drink nutritional supplement as recommended]           SDOH assessments and interventions completed:  No  Care Coordination Interventions:  Yes, provided   Follow up  plan: Follow up call scheduled for 10/05/22    Encounter Outcome:  Pt. Visit Completed   [name], RN, MSN, BSN, [place] Coordinator [phone]

## 2022-09-30 NOTE — Patient Instructions (Addendum)
Visit Information  Thank you for taking time to visit with me today. Please don't hesitate to contact me if I can be of assistance to you.   Following are the goals we discussed today:   Goals Addressed             This Visit's Progress    Health Management       Interventions Today    Flowsheet Row Most Recent Value  Chronic Disease   Chronic disease during today's visit Other  [health managment post SNF stay]  General Interventions   General Interventions Discussed/Reviewed General Interventions Reviewed, Doctor Visits  Doctor Visits Discussed/Reviewed Doctor Visits Reviewed  [instructions per GI office reviewed with patient.]  Exercise Interventions   Exercise Discussed/Reviewed Physical Activity  [Advised to remain as active per provider recommedations]  Education Interventions   Education Provided Provided Education  Provided Verbal Education On Other  [facility] reviewed hos miralax works in the body drawing water into the colon and advised should be taken with recommended about of liquid/water. encouraged to stay hydrated. and contact provider if does not improve]  Nutrition Interventions   Nutrition Discussed/Reviewed Nutrition Reviewed, Supplmental nutrition  [encouraged to continue to eat healthy, drink nutritional supplement as recommended]           Our next appointment is by telephone on 10/07/22 at 10:30 am  Please call the care guide team at [phone] if you need to cancel or reschedule your appointment.   If you are experiencing a Mental Health or [place] or need someone to talk to, please call the Suicide and Crisis Lifeline: [place], RN, MSN, BSN, [place] [phone]

## 2022-10-03 ENCOUNTER — Other Ambulatory Visit: Payer: Self-pay | Admitting: Internal Medicine

## 2022-10-03 MED ORDER — LINACLOTIDE 145 MCG PO CAPS
145.0000 ug | ORAL_CAPSULE | Freq: Every day | ORAL | 3 refills | Status: DC
  Filled 2022-10-03: qty 30, 30d supply, fill #0
  Filled 2022-11-15: qty 30, 30d supply, fill #1
  Filled 2023-01-12: qty 30, 30d supply, fill #2
  Filled 2023-03-04: qty 30, 30d supply, fill #3

## 2022-10-04 ENCOUNTER — Other Ambulatory Visit: Payer: Self-pay

## 2022-10-04 ENCOUNTER — Other Ambulatory Visit (HOSPITAL_COMMUNITY): Payer: Self-pay

## 2022-10-05 ENCOUNTER — Ambulatory Visit: Payer: Self-pay

## 2022-10-05 NOTE — Patient Instructions (Addendum)
Visit Information  Thank you for taking time to visit with me today. Please don't hesitate to contact me if I can be of assistance to you.   Following are the goals we discussed today:   Goals Addressed             This Visit's Progress    Health Management       Interventions Today    Flowsheet Row Most Recent Value  Chronic Disease   Chronic disease during today's visit Other  General Interventions   General Interventions Discussed/Reviewed General Interventions Reviewed  [reviewed upcoming appointments. encouraged to participate in activities she enjoys: likes reading but she needs new glasses and needs to find opthamalogist. confirmed patient has PCP contact number and knows how to call with health questions or concerns.]  Education Interventions   Provided Verbal Education On Nutrition, Other, Insurance Plans  [educated on how to [employer] health system re: insurance to find opthamalogist in network. Ms. Mallinger states nephew to assist with arranging appointment. Provided RNCM contact number and encouraged to call as needed.]  Nutrition Interventions   Nutrition Discussed/Reviewed Nutrition Reviewed  [encouraged to continue to try to eat healthy, small frequent meals if needed]  Safety Interventions   Safety Discussed/Reviewed Fall Risk  [reviewed fall precaution strategies, discussed maintaining excercises that was provided by physical therapist to maintain muscle strength/tone as possible. she has a life alert system in place.]           Our next appointment is by telephone on 11/09/22 at 10 am  Please call the care guide team at [phone] if you need to cancel or reschedule your appointment.   If you are experiencing a Mental Health or [place] or need someone to talk to, please call the Suicide and Crisis Lifeline: [place], RN, MSN, BSN, [place] [phone]

## 2022-10-05 NOTE — Patient Outreach (Signed)
  Care Coordination   Follow Up Visit Note   10/05/2022 Name: Tina Riley MRN: [id number] DOB: August 28, 1931  Tina Riley is a 89 y.o. year old female who sees Hoyt Koch, MD for primary care. I spoke with  Alger Memos Feick by phone today.  What matters to the patients health and wellness today?  Tina Riley reports she is doing ok. She reports she has switched to [place] which is working better for her now. Per review of chart, Linzess refill per gastroenterologist done on 10/03/22. 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 She continues to have a care giver that comes twice a week. And she has the assistance of her nephew who assist in managing her care.   Goals Addressed             This Visit's Progress    Health Management       Interventions Today    Flowsheet Row Most Recent Value  Chronic Disease   Chronic disease during today's visit Other  General Interventions   General Interventions Discussed/Reviewed General Interventions Reviewed  [reviewed upcoming appointments. encouraged to participate in activities she enjoys: likes reading but she needs new glasses and needs to find opthamalogist. confirmed patient has PCP contact number and knows how to call with health questions or concerns.]  Education Interventions   Provided Verbal Education On Nutrition, Other, Insurance Plans  [educated on how to [employer] health system re: insurance to find opthamalogist in network. Tina Riley states nephew to assist with arranging appointment. Provided RNCM contact number and encouraged to call as needed.]  Nutrition Interventions   Nutrition Discussed/Reviewed Nutrition Reviewed  [encouraged to continue to try to eat healthy, small frequent meals if needed]  Safety Interventions   Safety Discussed/Reviewed Fall  Risk  [reviewed fall precaution strategies, discussed maintaining excercises that was provided by physical therapist to maintain muscle strength/tone as possible. she has a life alert system in place.]           SDOH assessments and interventions completed:  No  Care Coordination Interventions:  Yes, provided   Follow up plan: Follow up call scheduled for 11/09/22    Encounter Outcome:  Pt. Visit Completed   [name], RN, MSN, BSN, [place] Coordinator [phone]

## 2022-10-08 ENCOUNTER — Other Ambulatory Visit (HOSPITAL_COMMUNITY): Payer: Self-pay

## 2022-10-27 ENCOUNTER — Telehealth: Payer: Self-pay | Admitting: Internal Medicine

## 2022-10-27 NOTE — Telephone Encounter (Signed)
Pt called saying she has an inner infection but she don't have no way of coming in office. Pt wants a nurse to give her a call back with advice

## 2022-10-28 ENCOUNTER — Encounter: Payer: Self-pay | Admitting: Internal Medicine

## 2022-10-28 NOTE — Telephone Encounter (Signed)
I have scheduled her to be seen by Dr Okey Dupre and she will be accompanied by her nephew

## 2022-11-02 ENCOUNTER — Ambulatory Visit (INDEPENDENT_AMBULATORY_CARE_PROVIDER_SITE_OTHER): Payer: Medicare Other | Admitting: Internal Medicine

## 2022-11-02 ENCOUNTER — Other Ambulatory Visit (HOSPITAL_COMMUNITY): Payer: Self-pay

## 2022-11-02 ENCOUNTER — Encounter: Payer: Self-pay | Admitting: Internal Medicine

## 2022-11-02 VITALS — BP 180/100 | HR 78 | Temp 97.8°F | Ht 59.0 in | Wt 96.5 lb

## 2022-11-02 DIAGNOSIS — R42 Dizziness and giddiness: Secondary | ICD-10-CM | POA: Diagnosis not present

## 2022-11-02 MED ORDER — PREDNISONE 20 MG PO TABS
40.0000 mg | ORAL_TABLET | Freq: Every day | ORAL | 0 refills | Status: AC
  Filled 2022-11-02: qty 8, 4d supply, fill #0

## 2022-11-02 NOTE — Patient Instructions (Signed)
We have sent in prednisone to take 2 pills daily for 4 days to help the dizziness.

## 2022-11-02 NOTE — Progress Notes (Unsigned)
   Subjective:   Patient ID: Tina Riley, female    DOB: [birth year], 89 y.o.   MRN: [id number]  HPI The patient is an 89 YO female coming in for dizziness lately. When turning head to right more so.  Review of Systems  Constitutional: Negative.   HENT:  Positive for ear pain and sinus pressure.   Eyes: Negative.   Respiratory:  Negative for cough, chest tightness and shortness of breath.   Cardiovascular:  Negative for chest pain, palpitations and leg swelling.  Gastrointestinal:  Negative for abdominal distention, abdominal pain, constipation, diarrhea, nausea and vomiting.  Musculoskeletal: Negative.   Skin: Negative.   Neurological:  Positive for dizziness.  Psychiatric/Behavioral: Negative.      Objective:  Physical Exam Constitutional:      Appearance: She is well-developed. She is ill-appearing.  HENT:     Head: Normocephalic and atraumatic.     Left Ear: Tympanic membrane normal.     Ears:     Comments: Right TM bulging clear fluid Cardiovascular:     Rate and Rhythm: Normal rate and regular rhythm.  Pulmonary:     Effort: Pulmonary effort is normal. No respiratory distress.     Breath sounds: Normal breath sounds. No wheezing or rales.  Abdominal:     General: Bowel sounds are normal. There is no distension.     Palpations: Abdomen is soft.     Tenderness: There is no abdominal tenderness. There is no rebound.  Musculoskeletal:     Cervical back: Normal range of motion.  Skin:    General: Skin is warm and dry.  Neurological:     Mental Status: She is alert and oriented to person, place, and time.     Coordination: Coordination abnormal.     Vitals:   11/02/22 1307 11/02/22 1309  BP: (!) 180/100 (!) 180/100  Pulse: 78   Temp: 97.8 F (36.6 C)   TempSrc: Oral   SpO2: 98%   Weight: 96 lb 8 oz (43.8 kg)   Height:  (1.499 m)     Assessment & Plan:

## 2022-11-03 ENCOUNTER — Other Ambulatory Visit (HOSPITAL_COMMUNITY): Payer: Self-pay

## 2022-11-03 DIAGNOSIS — R42 Dizziness and giddiness: Secondary | ICD-10-CM | POA: Insufficient documentation

## 2022-11-03 NOTE — Assessment & Plan Note (Signed)
Suspect sinus fluid due to allergies. Rx prednisone short course to help. Advised to avoid turning head quickly. She is taking antihistamine.

## 2022-11-09 ENCOUNTER — Ambulatory Visit: Payer: Self-pay

## 2022-11-09 NOTE — Patient Outreach (Signed)
  Care Coordination   Follow Up Visit Note   11/09/2022 Name: Tina Riley MRN: [id number] DOB: January 28, 1932  Tina Riley is a 89 y.o. year old female who sees Tina Broker, MD for primary care. I spoke with  Tina Riley by phone today.  What matters to the patients health and wellness today?  Tina Riley reports she had little inner ear problem. PCP seen on 11/02/22-took Prednisone, but expresses she is not quite over the inner ear and still has dizziness. She reports "It is better to a degree, but it has not gone away". Tina Riley reports history of having Epley maneuver done in the past and questions if the Eply maneuver will help. She states she has tried to do the maneuver herself, but has not worked. Patient request RNCM notify provider and ask provider for recommendation.  Goals Addressed             This Visit's Progress    Health Management       Interventions Today    Flowsheet Row Most Recent Value  General Interventions   General Interventions Discussed/Reviewed General Interventions Reviewed, Communication with, Doctor Visits  Doctor Visits Discussed/Reviewed Doctor Visits Discussed, PCP  Communication with PCP/Specialists  [per patient request nofity PCP regarding her report of continued dizziness]  Education Interventions   Education Provided Provided Education  [reviedwed instructions per PCP visit 11/02/22]  Nutrition Interventions   Nutrition Discussed/Reviewed Nutrition Discussed  [encouraged to eat healthy balanced meals]  Pharmacy Interventions   Pharmacy Dicussed/Reviewed Pharmacy Topics Reviewed  Safety Interventions   Safety Discussed/Reviewed Fall Risk, Safety Reviewed  [encouraged to continue fall prevention strategies]           SDOH assessments and interventions completed:  No  Care Coordination Interventions:  Yes, provided   Follow up plan: Follow up call scheduled for 12/02/22    Encounter Outcome:  Pt. Visit Completed    Tina Sheriff, RN, MSN, BSN, CCM [facility] Care Coordinator [phone]

## 2022-11-09 NOTE — Patient Instructions (Signed)
Visit Information  Thank you for taking time to visit with me today. Please don't hesitate to contact me if I can be of assistance to you.   Following are the goals we discussed today:   Goals Addressed             This Visit's Progress    Health Management       Interventions Today    Flowsheet Row Most Recent Value  General Interventions   General Interventions Discussed/Reviewed General Interventions Reviewed, Communication with  Communication with PCP/Specialists  [updated PCP regarding dizziness]  Education Interventions   Education Provided Provided Education  [reviedwed instructions per PCP visit 11/02/22]  Nutrition Interventions   Nutrition Discussed/Reviewed Nutrition Discussed  [encouraged to eat healthy balanced meals]  Pharmacy Interventions   Pharmacy Dicussed/Reviewed Pharmacy Topics Reviewed  Safety Interventions   Safety Discussed/Reviewed Fall Risk, Safety Reviewed  [encouraged to continue fall prevention strategies]           Our next appointment is by telephone on 12/02/22 at 10:30 am  Please call the care guide team at [phone] if you need to cancel or reschedule your appointment.   If you are experiencing a Mental Health or Behavioral Health Crisis or need someone to talk to, please call the Suicide and Crisis Lifeline: 19  Kathyrn Sheriff, RN, MSN, BSN, CCM [facility] Care Coordinator [phone]

## 2022-11-10 ENCOUNTER — Telehealth: Payer: Self-pay

## 2022-11-10 NOTE — Patient Outreach (Signed)
  Care Coordination   Follow Up Visit Note   11/10/2022 Name: Tina Riley MRN: [id number] DOB: 11/29/1931  Tina Riley is a 89 y.o. year old female who sees Myrlene Broker, MD for primary care. I spoke with  Tina Riley and by phone today.  What matters to the patients health and wellness today? Follow up regarding dizziness: per patient she would like to see ENT, but does not have specialist in the area. Patient gave permission for [facility] to call nephew, Tina Riley, to discuss. Per nephew, patient has had the Epley Maneuver done a couple of times and is in agreement if PCP agrees. Nephew is agreeable to discuss level of care options with LCSW.  Goals Addressed             This Visit's Progress    Health Management       Interventions Today    Flowsheet Row Most Recent Value  Chronic Disease   Chronic disease during today's visit Other  [dizziness]  General Interventions   General Interventions Discussed/Reviewed Communication with, Level of Care  Communication with Social Work, PCP/Specialists  [message to PCP regarding patient request for ENT referral, referral to LCSW to discuss level of care options]  Safety Interventions   Safety Discussed/Reviewed Fall Risk, Safety Reviewed           SDOH assessments and interventions completed:  No  Care Coordination Interventions:  Yes, provided   Follow up plan:  RNCM will continue to follow    Encounter Outcome:  Pt. Visit Completed   Kathyrn Sheriff, RN, MSN, BSN, CCM [facility] Care Coordinator [phone]

## 2022-11-11 ENCOUNTER — Ambulatory Visit: Payer: Self-pay | Admitting: Licensed Clinical Social Worker

## 2022-11-11 NOTE — Patient Instructions (Signed)
Visit Information  Thank you for taking time to visit with me today. Please don't hesitate to contact me if I can be of assistance to you.   Following are the goals we discussed today:   Goals Addressed             This Visit's Progress    COMPLETED: caregiver information on levels of care       Activities and task to complete in order to accomplish goals.   Call Kalman Jewels [phone] with [facility] she can share facility options based on price       Please call the care guide team at [phone] if you need to cancel or reschedule your appointment.    Patient's nephew Tina Riley verbalizes understanding of instructions and care plan provided today and agrees to view in [place]. Active MyChart status and patient understanding of how to access instructions and care plan via MyChart confirmed with patient.     No further follow up required: by Social Work at this time  Sammuel Hines, [employer] Social Work Care Coordination  [employer] Emmie Niemann [employer] [phone]

## 2022-11-11 NOTE — Patient Outreach (Signed)
  Care Coordination  Follow Up Visit Note   11/11/2022 Name: Aerial Dilley MRN: [id number] DOB: [birth year]  Jaquala Fuller Dingley is a 89 y.o. year old female who sees Myrlene Broker, MD for primary care. I spoke with  Elberta Spaniel Eckart 's Nephew Mitch by phone today.  What matters to the patients health and wellness today?  Exploring facility options based on level of care needs.  Patient does not qualify for Medicaid and would have to private pay for assisted living   Goals Addressed             This Visit's Progress    COMPLETED: caregiver information on levels of care       Activities and task to complete in order to accomplish goals.   Call Kalman Jewels [phone] with [facility] she can share facility options based on price       SDOH assessments and interventions completed:  No   Care Coordination Interventions:  Yes, provided  Interventions Today    Flowsheet Row Most Recent Value  General Interventions   General Interventions Discussed/Reviewed General Interventions Reviewed, Communication with, Level of Care  Communication with RN  [Patient's Nephew]  Level of Care Assisted Living, Skilled Nursing Facility  [Discussed levels of care and process]  Education Interventions   Education Provided Provided Education       Follow up plan: No further intervention required. Nephew will contact LCSW if needed  Encounter Outcome:  Pt. Visit Completed   Sammuel Hines, LCSW Social Work Care Coordination  [facility] [employer] [phone]

## 2022-11-12 ENCOUNTER — Telehealth: Payer: Self-pay | Admitting: Internal Medicine

## 2022-11-12 NOTE — Telephone Encounter (Signed)
Update about what? Can we ask about burning, urgency, timeline of symptoms?

## 2022-11-12 NOTE — Telephone Encounter (Signed)
Pt called wanting to let Dr. Okey Dupre know that she is having frequent urination. Please call pt with update

## 2022-11-12 NOTE — Telephone Encounter (Signed)
Okay to monitor. Likely not infection

## 2022-11-15 ENCOUNTER — Other Ambulatory Visit (HOSPITAL_COMMUNITY): Payer: Self-pay

## 2022-11-15 ENCOUNTER — Other Ambulatory Visit: Payer: Self-pay

## 2022-11-16 NOTE — Telephone Encounter (Signed)
Okay to move linzess to every other day. Urine is fine no changes needed. They can try epley maneuver at home for the dizziness or we can get them in with physical therapy to do treatment for the vertigo if they prefer.

## 2022-11-17 ENCOUNTER — Other Ambulatory Visit (HOSPITAL_COMMUNITY): Payer: Self-pay

## 2022-11-17 ENCOUNTER — Other Ambulatory Visit: Payer: Self-pay | Admitting: Internal Medicine

## 2022-11-17 NOTE — Telephone Encounter (Signed)
Spoke with Pt and she understood taking Linzess every other day to help with her stool. And will hold off on PT and will call if she has other issuse

## 2022-11-18 ENCOUNTER — Other Ambulatory Visit (HOSPITAL_COMMUNITY): Payer: Self-pay

## 2022-11-18 MED ORDER — ROSUVASTATIN CALCIUM 5 MG PO TABS
5.0000 mg | ORAL_TABLET | Freq: Every day | ORAL | 5 refills | Status: DC
  Filled 2022-11-18: qty 30, 30d supply, fill #0
  Filled 2022-12-14: qty 30, 30d supply, fill #1
  Filled 2023-01-12: qty 30, 30d supply, fill #2
  Filled 2023-02-12: qty 30, 30d supply, fill #3
  Filled 2023-03-04 – 2023-03-22 (×2): qty 30, 30d supply, fill #4
  Filled 2023-04-21: qty 30, 30d supply, fill #5

## 2022-11-18 MED ORDER — LEVOCETIRIZINE DIHYDROCHLORIDE 5 MG PO TABS
5.0000 mg | ORAL_TABLET | Freq: Every day | ORAL | 5 refills | Status: DC
  Filled 2022-11-18: qty 30, 30d supply, fill #0
  Filled 2022-12-14: qty 30, 30d supply, fill #1
  Filled 2023-01-12: qty 30, 30d supply, fill #2
  Filled 2023-02-12: qty 30, 30d supply, fill #3
  Filled 2023-03-04 – 2023-03-22 (×2): qty 30, 30d supply, fill #4
  Filled 2023-04-21: qty 30, 30d supply, fill #5

## 2022-11-19 ENCOUNTER — Other Ambulatory Visit (HOSPITAL_COMMUNITY): Payer: Self-pay

## 2022-11-29 ENCOUNTER — Ambulatory Visit: Payer: Self-pay

## 2022-11-29 NOTE — Patient Outreach (Signed)
  Care Coordination   Follow Up Visit Note   11/29/2022 Name: Tina Riley MRN: [id number] DOB: 1932-06-07  Tina Riley is a 89 y.o. year old female who sees Myrlene Broker, MD for primary care. I spoke with Mitch Camp(nephew/dpr) and Elberta Spaniel Dubree by phone today.  What matters to the patients health and wellness today?  Per Ms. Bettinger concern of boil that has develop in private area. She states she has an appointment with PCP on tomorrow. She states, "I am old and I move slow". She reports changing positions slowly helps prevent/minimize dizziness. She expresses she will discuss boil area, her medications and possible physical therapy with PCP tomorrow. Mitch reports Ms. Austria's personal care service hours have been increased from 4 hour/week to 8 hours/week with the addition of personal care service in addition to light housekeeping. Ms. Schnurr states this will be helpful for her. No additional care coordination needs identified. Both patient and nephew agree to contact RNCM if care coordination needs in the future.  Goals Addressed             This Visit's Progress    COMPLETED: Health Management       Interventions Today    Flowsheet Row Most Recent Value  Chronic Disease   Chronic disease during today's visit Other  General Interventions   General Interventions Discussed/Reviewed General Interventions Reviewed, Doctor Visits  [facility] provided contact number and encouraged both patient and nephew to contact RNCM if care coordination needs in the future]  Doctor Visits Discussed/Reviewed Doctor Visits Discussed  Level of Care Personal Care Services  [discussed care needs]  Education Interventions   Education Provided Provided Education  Provided Verbal Education On Other  [discussed fall prevention strategies, discussed physical therapy option for therapy muscle strength/tone/mobility-paitent to discuss with PCP tomorrow. RNCM encouraged patient to write a list  of what she needs to discuss with PCP.]  Safety Interventions   Safety Discussed/Reviewed Safety Discussed, Fall Risk  [reviewed fall prevention strategies]           SDOH assessments and interventions completed:  No  Care Coordination Interventions:  Yes, provided   Follow up plan: No further intervention required.   Encounter Outcome:  Pt. Visit Completed   Kathyrn Sheriff, RN, MSN, BSN, CCM [facility] Care Coordinator [phone]

## 2022-11-29 NOTE — Patient Instructions (Signed)
Visit Information  Thank you for taking time to visit with me today. Please don't hesitate to contact me if I can be of assistance to you.   Following are the goals we discussed today:   Goals Addressed             This Visit's Progress    COMPLETED: Health Management       Interventions Today    Flowsheet Row Most Recent Value  Chronic Disease   Chronic disease during today's visit Other  General Interventions   General Interventions Discussed/Reviewed General Interventions Reviewed, Doctor Visits  [facility] provided contact number and encouraged both patient and nephew to contact RNCM if care coordination needs in the future]  Doctor Visits Discussed/Reviewed Doctor Visits Discussed  Level of Care Personal Care Services  [discussed care needs]  Education Interventions   Education Provided Provided Education  Provided Verbal Education On Other  [discussed fall prevention strategies, discussed physical therapy option for therapy muscle strength/tone/mobility-paitent to discuss with PCP tomorrow. RNCM encouraged patient to write a list of what she needs to discuss with PCP.]  Safety Interventions   Safety Discussed/Reviewed Safety Discussed, Fall Risk  [reviewed fall prevention strategies]           If you are experiencing a Mental Health or Behavioral Health Crisis or need someone to talk to, please call the Suicide and Crisis Lifeline: 81  Kathyrn Sheriff, RN, MSN, BSN, CCM [facility] Care Coordinator [phone]

## 2022-11-30 ENCOUNTER — Ambulatory Visit (INDEPENDENT_AMBULATORY_CARE_PROVIDER_SITE_OTHER): Payer: Medicare Other | Admitting: Internal Medicine

## 2022-11-30 ENCOUNTER — Encounter: Payer: Self-pay | Admitting: Internal Medicine

## 2022-11-30 ENCOUNTER — Other Ambulatory Visit (HOSPITAL_COMMUNITY): Payer: Self-pay

## 2022-11-30 VITALS — BP 138/86 | HR 89 | Temp 97.7°F | Ht 59.0 in

## 2022-11-30 DIAGNOSIS — R42 Dizziness and giddiness: Secondary | ICD-10-CM | POA: Diagnosis not present

## 2022-11-30 DIAGNOSIS — B009 Herpesviral infection, unspecified: Secondary | ICD-10-CM

## 2022-11-30 MED ORDER — CLONAZEPAM 0.5 MG PO TABS
0.5000 mg | ORAL_TABLET | Freq: Two times a day (BID) | ORAL | 5 refills | Status: DC | PRN
  Filled 2022-11-30: qty 60, 30d supply, fill #0
  Filled 2023-01-12: qty 60, 30d supply, fill #1
  Filled 2023-03-04: qty 60, 30d supply, fill #2
  Filled 2023-05-20: qty 60, 30d supply, fill #3

## 2022-11-30 MED ORDER — GABAPENTIN 300 MG PO CAPS
300.0000 mg | ORAL_CAPSULE | Freq: Every day | ORAL | 1 refills | Status: DC
  Filled 2022-11-30: qty 90, 90d supply, fill #0
  Filled 2023-01-22 – 2023-02-12 (×2): qty 90, 90d supply, fill #1

## 2022-11-30 NOTE — Patient Instructions (Addendum)
We will do the vestibular therapy to help with the dizziness.

## 2022-11-30 NOTE — Progress Notes (Signed)
   Subjective:   Patient ID: Tina Riley, female    DOB: 1932-06-16, 89 y.o.   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   Review of Systems  Constitutional: Negative.   HENT: Negative.    Eyes: Negative.   Respiratory:  Negative for cough, chest tightness and shortness of breath.   Cardiovascular:  Negative for chest pain, palpitations and leg swelling.  Gastrointestinal:  Negative for abdominal distention, abdominal pain, constipation, diarrhea, nausea and vomiting.  Musculoskeletal: Negative.   Skin:  Positive for wound.  Neurological:  Positive for dizziness.  Psychiatric/Behavioral: Negative.      Objective:  Physical Exam Constitutional:      Appearance: She is well-developed.  HENT:     Head: Normocephalic and atraumatic.  Cardiovascular:     Rate and Rhythm: Normal rate and regular rhythm.  Pulmonary:     Effort: Pulmonary effort is normal. No respiratory distress.     Breath sounds: Normal breath sounds. No wheezing or rales.  Abdominal:     General: Bowel sounds are normal. There is no distension.     Palpations: Abdomen is soft.     Tenderness: There is no abdominal tenderness. There is no rebound.  Musculoskeletal:     Cervical back: Normal range of motion.  Skin:    General: Skin is warm and dry.  Neurological:     Mental Status: She is alert and oriented to person, place, and time.     Coordination: Coordination abnormal.     Comments: Wheelchair able to stand      Vitals:   11/30/22 1428  BP: 138/86  Pulse: 89  Temp: 97.7 F (36.5 C)  TempSrc: Oral  SpO2: 96%  Height: 4\' 11"  (1.499 m)    Assessment & Plan:

## 2022-12-03 NOTE — Assessment & Plan Note (Signed)
Vertigo sounding and referral to vestibular therapy to help. She is having some random episodes which is a risk for falling and we do not want a fracture with a fall.

## 2022-12-03 NOTE — Assessment & Plan Note (Signed)
Continue valtrex BID for 1 week to help recovery. She will then continue on daily for prevention long term.

## 2022-12-08 ENCOUNTER — Ambulatory Visit: Payer: Medicare Other

## 2022-12-14 ENCOUNTER — Other Ambulatory Visit: Payer: Self-pay

## 2022-12-14 ENCOUNTER — Other Ambulatory Visit: Payer: Self-pay | Admitting: Internal Medicine

## 2022-12-15 ENCOUNTER — Ambulatory Visit: Payer: Medicare Other

## 2022-12-15 ENCOUNTER — Other Ambulatory Visit (HOSPITAL_COMMUNITY): Payer: Self-pay

## 2022-12-15 ENCOUNTER — Other Ambulatory Visit: Payer: Self-pay

## 2022-12-15 MED ORDER — PROPRANOLOL HCL ER 60 MG PO CP24
60.0000 mg | ORAL_CAPSULE | Freq: Every day | ORAL | 3 refills | Status: DC
  Filled 2022-12-15: qty 90, 90d supply, fill #0
  Filled 2023-03-04 – 2023-03-22 (×2): qty 90, 90d supply, fill #1
  Filled 2023-07-18: qty 90, 90d supply, fill #2
  Filled 2023-10-13: qty 90, 90d supply, fill #3

## 2022-12-22 ENCOUNTER — Encounter: Payer: Self-pay | Admitting: Physical Therapy

## 2022-12-22 ENCOUNTER — Ambulatory Visit: Payer: Medicare Other | Attending: Internal Medicine | Admitting: Physical Therapy

## 2022-12-22 VITALS — BP 173/65 | HR 64

## 2022-12-22 DIAGNOSIS — R42 Dizziness and giddiness: Secondary | ICD-10-CM | POA: Diagnosis present

## 2022-12-22 DIAGNOSIS — R2681 Unsteadiness on feet: Secondary | ICD-10-CM | POA: Diagnosis present

## 2022-12-22 DIAGNOSIS — H8111 Benign paroxysmal vertigo, right ear: Secondary | ICD-10-CM | POA: Insufficient documentation

## 2022-12-22 DIAGNOSIS — H8112 Benign paroxysmal vertigo, left ear: Secondary | ICD-10-CM | POA: Diagnosis not present

## 2022-12-22 NOTE — Therapy (Signed)
OUTPATIENT PHYSICAL THERAPY VESTIBULAR EVALUATION     Patient Name: Tina Riley MRN: [id number] DOB:[birth year], 89 y.o., female Today's Date: 12/22/2022  END OF SESSION:  PT End of Session - 12/22/22 1605     Visit Number 1    Number of Visits 9    Date for PT Re-Evaluation 01/21/23    Authorization Type [employer] Medicare    PT Start Time 1403    PT Stop Time 1445    PT Time Calculation (min) 42 min    Activity Tolerance Patient tolerated treatment well    Behavior During Therapy WFL for tasks assessed/performed             Past Medical History:  Diagnosis Date   Arthritis    Asthma    Chicken pox    Depression    Diverticulitis    Genital warts    GERD (gastroesophageal reflux disease)    Hay fever    History of frequent urinary tract infections    Hypertension    IBS (irritable bowel syndrome)    Lactose intolerance    Peripheral neuropathy    Urinary incontinence    Past Surgical History:  Procedure Laterality Date   ABDOMINAL HYSTERECTOMY     Hip Replacement  2020   PARTIAL COLECTOMY  1980s   TONSILLECTOMY AND ADENOIDECTOMY  childhood   vocal cord tumor removal  2012   Patient Active Problem List   Diagnosis Date Noted   Dizziness 11/03/2022   Other fatigue 08/02/2022   UTI (urinary tract infection) 06/26/2022   Protein-calorie malnutrition (HCC) 05/17/2022   Routine general medical examination at a health care facility 05/14/2022   Left hip pain 05/14/2022   Constipation 11/04/2021   Renal insufficiency 11/04/2021   Pulmonary nodule 11/04/2021   Dysphagia 09/18/2021   Hypokalemia 08/14/2021   Cognitive change 08/14/2021   Hyponatremia 08/12/2021   Anxiety 02/24/2021   GERD (gastroesophageal reflux disease) 02/24/2021   Hair thinning 12/25/2020   HSV (herpes simplex virus) infection 10/17/2020   Allergic rhinitis 10/17/2020   Hyperlipidemia 10/17/2020   Diarrhea 10/17/2020   Asthma 10/17/2020   Insomnia 10/17/2020   Poor appetite  10/17/2020   Tremor 10/17/2020    PCP: Myrlene Broker, MD REFERRING PROVIDER: Myrlene Broker, MD  REFERRING DIAG: 42 (ICD-10-CM) - Vertigo  THERAPY DIAG:  Dizziness and giddiness  Unsteadiness on feet  BPPV (benign paroxysmal positional vertigo), right  ONSET DATE: 11/30/2022  Rationale for Evaluation and Treatment: Rehabilitation  SUBJECTIVE:   SUBJECTIVE STATEMENT: Reports having dizziness from sitting to standing. Turning head to the R and rolling in bed to the R will make her feel dizzy. Has a spinning sensation. Had this 4 years ago, went to an ENT in [place] and reports they did the Epley. Now dizziness is back and is unsure when it started - feels like the episode from 4 years ago. Wants to not fall and has fallen several times. Has had 5 falls in the past 6 months. Normally in a transport chair (arrives in one today).  Walks with a RW at home, uses it because she is more confident.  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  Pt accompanied by:  Cathlean Cower  PERTINENT HISTORY: PMH: Arthritis, GERD, HTN, peripheral neuropathy, hip replacement in 2020   PAIN:  Are you having pain? No Has neuropathy from knees down, so feels uncomfortable.   Vitals:   12/22/22 1423  BP: Marland Kitchen)  173/65  Pulse: 64     PRECAUTIONS: Fall     WEIGHT BEARING RESTRICTIONS: No  FALLS: Has patient fallen in last 6 months? Yes. Number of falls 5  LIVING ENVIRONMENT: Lives with:  has a sitter that comes in 2x a week for a few hours during the day time. Just there with supervision mainly  Lives in: House/apartment Stairs: lives on the ground floor  Has following equipment at home: Dan Humphreys - 2 wheeled, Tour manager, Grab bars, and Transport chair   PLOF: Independent with household mobility with device  PATIENT GOALS: Wants to walk by herself without any AD - last time she did this was 3 years ago, wants to address the dizziness.   OBJECTIVE:     POSTURE:  rounded shoulders, forward head, increased thoracic kyphosis, and posterior pelvic tilt  Cervical ROM:   Limited into cervical extension AROM due to posture   TRANSFERS: Stand step transfer from w/c > mat table with RW with supervision   GAIT: Pt arrives in transport chair, but uses RW for household distances   PATIENT SURVEYS:  FOTO DPS: 39, DFS: 38.7  VESTIBULAR ASSESSMENT:  GENERAL OBSERVATION: Arrives in transport chair with nephew, NAD.    SYMPTOM BEHAVIOR:  Subjective history: see above.   Non-Vestibular symptoms: neck pain, headaches, and nausea/vomiting  Type of dizziness: Spinning/Vertigo, "afraid, fearful"   Frequency: Just when she turns to the R   Duration: <1 minute   Aggravating factors: Induced by position change: rolling to the left and sit to stand and Induced by motion: turning head quickly and looking to the R   Relieving factors:  being quiet   Progression of symptoms: unchanged  OCULOMOTOR EXAM:  Ocular Alignment: normal and head tilted towards R  Ocular ROM: No Limitations  Spontaneous Nystagmus: absent  Gaze-Induced Nystagmus: absent  Smooth Pursuits: intact and "feels a little giddyheaded"   Saccades: intact and "feels giddyheaded and has a little headache"   VESTIBULAR - OCULAR REFLEX:   Slow VOR: Normal and Comment: "bothersome for eyes"   VOR Cancellation: Normal "bothersome for eyes"   Head-Impulse Test: HIT Right: negative HIT Left: positive - pt initially closing her eyes    POSITIONAL TESTING: Right Dix-Hallpike: upbeating, right nystagmus and pt initially closing her eyes due to dizziness, but able to see 5 seconds of R upbeating rotary nystagmus. With further assessment after sitting pt up to explain Epley and clinical findings, unable to see nystagmus and pt reporting no dizziness in position.  Left Dix-Hallpike: no nystagmus  Pt with incr thoracic kyphosis and decr cervical extension AROM, performed with single and  double pillow behind back - will have to Trendelenberg at next session with more time.     VESTIBULAR TREATMENT:                                                                                                   Was going to perform the Epley, but with further assessment into R DixHallpike, no nystagmus and pt not having any dizziness.    PATIENT EDUCATION: Education details: Clinical findings, POC,  will further have to look at positional testing at next session, etiology of BPPV  Person educated: Patient and pt's nephew  Education method: Explanation Education comprehension: verbalized understanding  HOME EXERCISE PROGRAM: Will provide at a future session.   GOALS: Goals reviewed with patient? Yes  SHORT TERM GOALS: ALL STGS = LTGS  LONG TERM GOALS: Target date: 01/19/2023  Pt will be independent with final vestibular HEP in order to decr dizziness.  Baseline:  Goal status: INITIAL  2.  Pt with demo negative positional testing in order to decr dizziness  Baseline: R posterior canalithiasis  Goal status: INITIAL  3.  Pt will improve DPS to at least 53 in order to demo improved functional outcomes related to dizziness.  Baseline: 39 Goal status: INITIAL  4.  MSQ to be further assessed with LTG written.  Baseline:  Goal status: INITIAL   ASSESSMENT:  CLINICAL IMPRESSION: Patient is a 89 year old female referred to Neuro OPPT for dizziness.   Pt's PMH is significant for: Arthritis, GERD, HTN, peripheral neuropathy, hip replacement in 2020, hx of BPPV. The following deficits were present during the exam: positive HIT to the R indicating impaired VOR and vestibular hypofunction, feeling "giddyheaded and dizzy" with saccades and smooth pursuits., impaired balance, gait abnormalities, postural abnormalities. Initially with R Gilberto Better, pt demonstrating R upbeating rotary nystagmus, indicating R posterior canalithiasis (unable to get accurate time as pt initially closing her eyes).  When going to re-assess to perform Epley, pt with no nystagmus/no dizziness in this position. Re-assessed 2 more times and no change. Did not treat today. Pt also reporting feeling better turning head to the R. Will have to further re-assess during next session. Also will benefit from putting bed in Trendelenberg position due to postural abnormalities and incr thoracic kyphosis. Pt would benefit from skilled PT to address these impairments and functional limitations to maximize functional mobility independence, decr fall risk, and decr dizziness.    OBJECTIVE IMPAIRMENTS: Abnormal gait, decreased activity tolerance, decreased balance, decreased mobility, difficulty walking, dizziness, hypomobility, impaired flexibility, and postural dysfunction.   ACTIVITY LIMITATIONS: bending, standing, transfers, bed mobility, and locomotion level  PARTICIPATION LIMITATIONS: shopping and community activity  PERSONAL FACTORS: Age, Behavior pattern, Past/current experiences, Time since onset of injury/illness/exacerbation, and 3+ comorbidities: Arthritis, GERD, HTN, peripheral neuropathy, hip replacement in 2020. Pt with hx of BPPV and reports being treated with the Epley before  are also affecting patient's functional outcome.   REHAB POTENTIAL: Good  CLINICAL DECISION MAKING: Evolving/moderate complexity  EVALUATION COMPLEXITY: Moderate   PLAN:  PT FREQUENCY: 2x/week  PT DURATION: 4 weeks  PLANNED INTERVENTIONS: Therapeutic exercises, Therapeutic activity, Neuromuscular re-education, Balance training, Gait training, Patient/Family education, Self Care, Stair training, Vestibular training, DME instructions, and Re-evaluation  PLAN FOR NEXT SESSION: Re-assess R posterior canalithiasis and treat as needed - will need to Trendelenberg bed. Perform MSQ and give HEP for VOR    Drake Leach, PT, DPT  12/22/2022, 4:33 PM

## 2022-12-28 ENCOUNTER — Ambulatory Visit: Payer: Medicare Other | Admitting: Physical Therapy

## 2022-12-31 ENCOUNTER — Ambulatory Visit: Payer: Medicare Other | Admitting: Physical Therapy

## 2022-12-31 ENCOUNTER — Encounter: Payer: Self-pay | Admitting: Physical Therapy

## 2022-12-31 VITALS — BP 162/69 | HR 61

## 2022-12-31 DIAGNOSIS — R42 Dizziness and giddiness: Secondary | ICD-10-CM

## 2022-12-31 DIAGNOSIS — R2681 Unsteadiness on feet: Secondary | ICD-10-CM

## 2022-12-31 DIAGNOSIS — H8111 Benign paroxysmal vertigo, right ear: Secondary | ICD-10-CM

## 2022-12-31 NOTE — Therapy (Signed)
OUTPATIENT PHYSICAL THERAPY VESTIBULAR TREATMENT/DISCHARGE SUMMARY     Patient Name: Tina Riley MRN: [id number] DOB:[birth year], 89 y.o., female Today's Date: 12/31/2022  END OF SESSION:  PT End of Session - 12/31/22 1404     Visit Number 2    Number of Visits 9    Date for PT Re-Evaluation 01/21/23    Authorization Type [employer] Medicare    PT Start Time 1402    PT Stop Time 1438   full time not used due to D/C visit   PT Time Calculation (min) 36 min    Activity Tolerance Patient tolerated treatment well    Behavior During Therapy WFL for tasks assessed/performed             Past Medical History:  Diagnosis Date   Arthritis    Asthma    Chicken pox    Depression    Diverticulitis    Genital warts    GERD (gastroesophageal reflux disease)    Hay fever    History of frequent urinary tract infections    Hypertension    IBS (irritable bowel syndrome)    Lactose intolerance    Peripheral neuropathy    Urinary incontinence    Past Surgical History:  Procedure Laterality Date   ABDOMINAL HYSTERECTOMY     Hip Replacement  2020   PARTIAL COLECTOMY  1980s   TONSILLECTOMY AND ADENOIDECTOMY  childhood   vocal cord tumor removal  2012   Patient Active Problem List   Diagnosis Date Noted   Dizziness 11/03/2022   Other fatigue 08/02/2022   UTI (urinary tract infection) 06/26/2022   Protein-calorie malnutrition (HCC) 05/17/2022   Routine general medical examination at a health care facility 05/14/2022   Left hip pain 05/14/2022   Constipation 11/04/2021   Renal insufficiency 11/04/2021   Pulmonary nodule 11/04/2021   Dysphagia 09/18/2021   Hypokalemia 08/14/2021   Cognitive change 08/14/2021   Hyponatremia 08/12/2021   Anxiety 02/24/2021   GERD (gastroesophageal reflux disease) 02/24/2021   Hair thinning 12/25/2020   HSV (herpes simplex virus) infection 10/17/2020   Allergic rhinitis 10/17/2020   Hyperlipidemia 10/17/2020   Diarrhea 10/17/2020   Asthma  10/17/2020   Insomnia 10/17/2020   Poor appetite 10/17/2020   Tremor 10/17/2020    PCP: Myrlene Broker, MD REFERRING PROVIDER: Myrlene Broker, MD  REFERRING DIAG: 42 (ICD-10-CM) - Vertigo  THERAPY DIAG:  Dizziness and giddiness  Unsteadiness on feet  BPPV (benign paroxysmal positional vertigo), right  ONSET DATE: 11/30/2022  Rationale for Evaluation and Treatment: Rehabilitation  SUBJECTIVE:   SUBJECTIVE STATEMENT: Still having dizziness when laying back. Has to be very careful. Has to have 2 pillows. Feels like her neck needs to pop.   Pt accompanied by:  Cathlean Cower (in waiting room)  PERTINENT HISTORY: PMH: Arthritis, GERD, HTN, peripheral neuropathy, hip replacement in 2020   PAIN:  Are you having pain? 8/10 stomach pain (due to taking a pill for her headache)   Vitals:   12/31/22 1409  BP: (!) 162/69  Pulse: 61      PRECAUTIONS: Fall   WEIGHT BEARING RESTRICTIONS: No  FALLS: Has patient fallen in last 6 months? Yes. Number of falls 5  LIVING ENVIRONMENT: Lives with:  has a sitter that comes in 2x a week for a few hours during the day time. Just there with supervision mainly  Lives in: House/apartment Stairs: lives on the ground floor  Has following equipment at home: Dan Humphreys - 2 wheeled, Tour manager, [employer]  bars, and Transport chair   PLOF: Independent with household mobility with device  PATIENT GOALS: Wants to walk by herself without any AD - last time she did this was 3 years ago, wants to address the dizziness.   OBJECTIVE:    POSTURE:  rounded shoulders, forward head, increased thoracic kyphosis, and posterior pelvic tilt  Cervical ROM:   Limited into cervical extension AROM due to posture   TRANSFERS: Stand step transfer from w/c > mat table with RW with supervision   GAIT: Pt arrives in transport chair, but uses RW for household distances   PATIENT SURVEYS:  FOTO DPS: 39, DFS: 38.7   VESTIBULAR TREATMENT:      POSITIONAL TESTING: Right Dix-Hallpike: no nystagmus and assessed 3 times, pt with no spinning dizziness or nystagmus  Left Dix-Hallpike: no nystagmus and no dizziness  Right Roll Test: no nystagmus Left Roll Test: no nystagmus Right Sidelying: no nystagmus Left Sidelying: no nystagmus  Put 4" risers under the bed for improved extension for positional testing.   MOTION SENSITIVITY:  Motion Sensitivity Quotient Intensity: 0 = none, 1 = Lightheaded, 2 = Mild, 3 = Moderate, 4 = Severe, 5 = Vomiting  Intensity  1. Sitting to supine 0  2. Supine to L side 0  3. Supine to R side 0  4. Supine to sitting 0  5. L Hallpike-Dix 0  6. Up from L  0  7. R Hallpike-Dix 0  8. Up from R  2, on first attempt, and then none on other attempts   9. Sitting, head tipped to L knee   10. Head up from L knee   11. Sitting, head tipped to R knee   12. Head up from R knee   13. Sitting head turns x5   14.Sitting head nods x5   15. In stance, 180 turn to L    16. In stance, 180 turn to R     PATIENT EDUCATION: Education details: Clinical findings, resolution of BPPV. Pt wishes to be discharged at this time due to no longer having dizziness. Pt deferred any further vestibular testing at this time and wants to be discharged. Educated if dizziness comes back in the future, then to get a new referral from her docotr  Person educated: Patient and pt's nephew  Education method: Explanation Education comprehension: verbalized understanding  HOME EXERCISE PROGRAM: N/A - pt discharged   PHYSICAL THERAPY DISCHARGE SUMMARY  Visits from Start of Care: 2  Current functional level related to goals / functional outcomes: See LTGs/Clinical Assessment Statement    Remaining deficits: Posture abnormalities, impaired balance, decr functional mobility.    Education / Equipment: Education on BPPV    Patient agrees to discharge. Patient goals were partially met. Patient is being discharged due to the  patient's request. Pt reports she is no longer dizzy and wants to be discharged.     GOALS: Goals reviewed with patient? Yes  SHORT TERM GOALS: ALL STGS = LTGS  LONG TERM GOALS: Target date: 01/19/2023  Pt will be independent with final vestibular HEP in order to decr dizziness.  Baseline: goal not needed - pt discharged  Goal status: INITIAL  2.  Pt with demo negative positional testing in order to decr dizziness  Baseline: R posterior canalithiasis, negative on 12/31/22 Goal status: MET  3.  Pt will improve DPS to at least 53 in order to demo improved functional outcomes related to dizziness.  Baseline: 39 Goal status: INITIAL  4.  MSQ  to be further assessed with LTG written.  Baseline: goal not needed - pt discharged  Goal status: INITIAL   ASSESSMENT:  CLINICAL IMPRESSION: Re-assessed positional testing today (pt previously with R posterior canal BPPV initially at eval), with pt negative for all positional testing and had no dizziness/nystagmus today. Pt is pleased with this and wishes to be discharged at this time. When PT asked about further vestibular assessment, pt deferred and did not feel like she needed it. Educated that if dizziness/BPPV returns in the future, then pt can get a new referral to come to this clinic. Pt and pt's nephew verbalized understanding of instructions and are in agreement to D/C.   OBJECTIVE IMPAIRMENTS: Abnormal gait, decreased activity tolerance, decreased balance, decreased mobility, difficulty walking, dizziness, hypomobility, impaired flexibility, and postural dysfunction.   ACTIVITY LIMITATIONS: bending, standing, transfers, bed mobility, and locomotion level  PARTICIPATION LIMITATIONS: shopping and community activity  PERSONAL FACTORS: Age, Behavior pattern, Past/current experiences, Time since onset of injury/illness/exacerbation, and 3+ comorbidities: Arthritis, GERD, HTN, peripheral neuropathy, hip replacement in 2020. Pt with hx of BPPV  and reports being treated with the Epley before  are also affecting patient's functional outcome.   REHAB POTENTIAL: Good  CLINICAL DECISION MAKING: Evolving/moderate complexity  EVALUATION COMPLEXITY: Moderate   PLAN:  PT FREQUENCY: 2x/week  PT DURATION: 4 weeks  PLANNED INTERVENTIONS: Therapeutic exercises, Therapeutic activity, Neuromuscular re-education, Balance training, Gait training, Patient/Family education, Self Care, Stair training, Vestibular training, DME instructions, and Re-evaluation  PLAN FOR NEXT SESSION: D/C   Drake Leach, PT, DPT  12/31/2022, 2:45 PM

## 2023-01-03 ENCOUNTER — Ambulatory Visit: Payer: Medicare Other | Admitting: Physical Therapy

## 2023-01-07 ENCOUNTER — Ambulatory Visit: Payer: Medicare Other | Admitting: Physical Therapy

## 2023-01-10 ENCOUNTER — Ambulatory Visit: Payer: Medicare Other | Admitting: Physical Therapy

## 2023-01-12 ENCOUNTER — Other Ambulatory Visit (HOSPITAL_BASED_OUTPATIENT_CLINIC_OR_DEPARTMENT_OTHER): Payer: Self-pay

## 2023-01-13 ENCOUNTER — Other Ambulatory Visit: Payer: Self-pay

## 2023-01-22 ENCOUNTER — Other Ambulatory Visit: Payer: Self-pay | Admitting: Internal Medicine

## 2023-01-23 ENCOUNTER — Other Ambulatory Visit (HOSPITAL_BASED_OUTPATIENT_CLINIC_OR_DEPARTMENT_OTHER): Payer: Self-pay

## 2023-01-25 ENCOUNTER — Other Ambulatory Visit (HOSPITAL_BASED_OUTPATIENT_CLINIC_OR_DEPARTMENT_OTHER): Payer: Self-pay

## 2023-01-25 MED ORDER — VALACYCLOVIR HCL 1 G PO TABS
1000.0000 mg | ORAL_TABLET | Freq: Every day | ORAL | 3 refills | Status: DC
  Filled 2023-01-25: qty 90, 90d supply, fill #0
  Filled 2023-04-21: qty 90, 90d supply, fill #1
  Filled 2023-07-18: qty 90, 90d supply, fill #2
  Filled 2023-10-13 – 2023-10-17 (×2): qty 90, 90d supply, fill #3

## 2023-03-04 ENCOUNTER — Other Ambulatory Visit: Payer: Self-pay | Admitting: Internal Medicine

## 2023-03-05 ENCOUNTER — Other Ambulatory Visit (HOSPITAL_BASED_OUTPATIENT_CLINIC_OR_DEPARTMENT_OTHER): Payer: Self-pay

## 2023-03-05 ENCOUNTER — Other Ambulatory Visit: Payer: Self-pay

## 2023-03-07 ENCOUNTER — Encounter: Payer: Self-pay | Admitting: Internal Medicine

## 2023-03-07 ENCOUNTER — Ambulatory Visit (INDEPENDENT_AMBULATORY_CARE_PROVIDER_SITE_OTHER): Payer: Medicare Other | Admitting: Internal Medicine

## 2023-03-07 ENCOUNTER — Ambulatory Visit (INDEPENDENT_AMBULATORY_CARE_PROVIDER_SITE_OTHER): Payer: Medicare Other

## 2023-03-07 VITALS — BP 132/82 | HR 62 | Temp 98.1°F | Ht 59.0 in | Wt 101.0 lb

## 2023-03-07 DIAGNOSIS — M545 Low back pain, unspecified: Secondary | ICD-10-CM | POA: Diagnosis not present

## 2023-03-07 DIAGNOSIS — K5900 Constipation, unspecified: Secondary | ICD-10-CM

## 2023-03-07 NOTE — Patient Instructions (Signed)
Try lidocaine patches on the back and we will check the x-ray today

## 2023-03-07 NOTE — Progress Notes (Unsigned)
   Subjective:   Patient ID: Tina Riley, female    DOB: February 13, 1932, 89 y.o.   MRN: [id number]  HPI The patient is an 89 YO female coming in for follow up.   Review of Systems  Constitutional:  Positive for activity change and fatigue.  HENT: Negative.    Eyes: Negative.   Respiratory:  Negative for cough, chest tightness and shortness of breath.   Cardiovascular:  Negative for chest pain, palpitations and leg swelling.  Gastrointestinal:  Negative for abdominal distention, abdominal pain, constipation, diarrhea, nausea and vomiting.  Musculoskeletal:  Positive for arthralgias.  Skin: Negative.   Neurological: Negative.   Psychiatric/Behavioral:  Positive for sleep disturbance. The patient is nervous/anxious.     Objective:  Physical Exam Constitutional:      Appearance: She is well-developed.  HENT:     Head: Normocephalic and atraumatic.  Cardiovascular:     Rate and Rhythm: Normal rate and regular rhythm.  Pulmonary:     Effort: Pulmonary effort is normal. No respiratory distress.     Breath sounds: Normal breath sounds. No wheezing or rales.  Abdominal:     General: Bowel sounds are normal. There is no distension.     Palpations: Abdomen is soft.     Tenderness: There is no abdominal tenderness. There is no rebound.  Musculoskeletal:     Cervical back: Normal range of motion.  Skin:    General: Skin is warm and dry.  Neurological:     Mental Status: She is alert and oriented to person, place, and time.     Coordination: Coordination abnormal.     Vitals:   03/07/23 1501  BP: 132/82  Pulse: 62  Temp: 98.1 F (36.7 C)  TempSrc: Oral  SpO2: 97%  Weight: 101 lb (45.8 kg)  Height: 4\' 11"  (1.499 m)    Assessment & Plan:

## 2023-03-08 ENCOUNTER — Other Ambulatory Visit (HOSPITAL_BASED_OUTPATIENT_CLINIC_OR_DEPARTMENT_OTHER): Payer: Self-pay

## 2023-03-09 DIAGNOSIS — M545 Low back pain, unspecified: Secondary | ICD-10-CM | POA: Insufficient documentation

## 2023-03-09 NOTE — Assessment & Plan Note (Signed)
New back pain in last 2-3 weeks and stable. Concern for fracture given weight and likely bone density. Ordered x-ray lumbar to assess. Depending on results we can try treatment. Advised to try lidocaine 4% over the counter in meantime for pain.

## 2023-03-09 NOTE — Assessment & Plan Note (Signed)
Using linzess and otc and this is overall doing well but lately not as well. Unsure if her back pain may be playing a role in her worsening constipation.

## 2023-03-10 ENCOUNTER — Encounter: Payer: Self-pay | Admitting: Internal Medicine

## 2023-03-16 ENCOUNTER — Encounter: Payer: Self-pay | Admitting: Internal Medicine

## 2023-03-17 ENCOUNTER — Other Ambulatory Visit (HOSPITAL_COMMUNITY): Payer: Self-pay

## 2023-03-17 MED ORDER — DICYCLOMINE HCL 10 MG PO CAPS
10.0000 mg | ORAL_CAPSULE | Freq: Two times a day (BID) | ORAL | 3 refills | Status: DC | PRN
  Filled 2023-03-17 – 2023-03-23 (×2): qty 180, 90d supply, fill #0

## 2023-03-22 ENCOUNTER — Other Ambulatory Visit (HOSPITAL_BASED_OUTPATIENT_CLINIC_OR_DEPARTMENT_OTHER): Payer: Self-pay

## 2023-03-23 ENCOUNTER — Telehealth: Payer: Self-pay | Admitting: Internal Medicine

## 2023-03-23 ENCOUNTER — Other Ambulatory Visit (HOSPITAL_COMMUNITY): Payer: Self-pay

## 2023-03-23 ENCOUNTER — Other Ambulatory Visit (HOSPITAL_BASED_OUTPATIENT_CLINIC_OR_DEPARTMENT_OTHER): Payer: Self-pay

## 2023-03-23 NOTE — Telephone Encounter (Signed)
Patient called and said she has a boil on her buttocks that needs to be lanced. She wants to know if that is something Dr. Okey Dupre can do or if she can refer her to someone that can. Patient would like a call back at [phone].

## 2023-03-24 ENCOUNTER — Other Ambulatory Visit (HOSPITAL_COMMUNITY): Payer: Self-pay

## 2023-03-24 MED ORDER — CEFDINIR 300 MG PO CAPS
300.0000 mg | ORAL_CAPSULE | Freq: Two times a day (BID) | ORAL | 0 refills | Status: AC
  Filled 2023-03-24: qty 10, 5d supply, fill #0

## 2023-03-24 NOTE — Telephone Encounter (Signed)
Called patient and informed her that medication has been called in and read back instruction on how to take it

## 2023-03-24 NOTE — Telephone Encounter (Signed)
Have sent in omnicef to take 1 pill twice a day for 5 days to see if this helps. If not can schedule to assess

## 2023-03-27 IMAGING — CT CT ABD-PELV W/ CM
2 of 8 series · 14 of 46 positions shown, 18 images · IV contrast (OMNIPAQUE 350)
Comparison: None.

CLINICAL DATA: Abdominal pain.

EXAM:
CT ABDOMEN AND PELVIS WITH CONTRAST
TECHNIQUE: Multidetector CT imaging of the abdomen and pelvis was performed
using the standard protocol following bolus administration of
intravenous contrast.
CONTRAST:  70mL OMNIPAQUE IOHEXOL 350 MG/ML SOLN

[Series 3: axial st · axial · 0.60mm/px · z∈[+1085,+1420]mm · 11 of 77 slices shown, 15 images]
[im 5/77  soft-tissue]
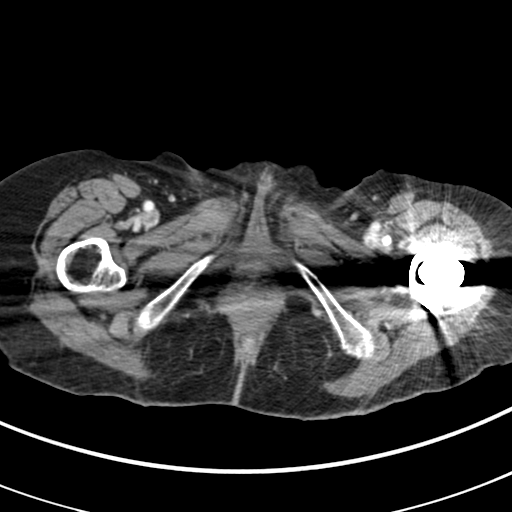
[im 5/77  bone]
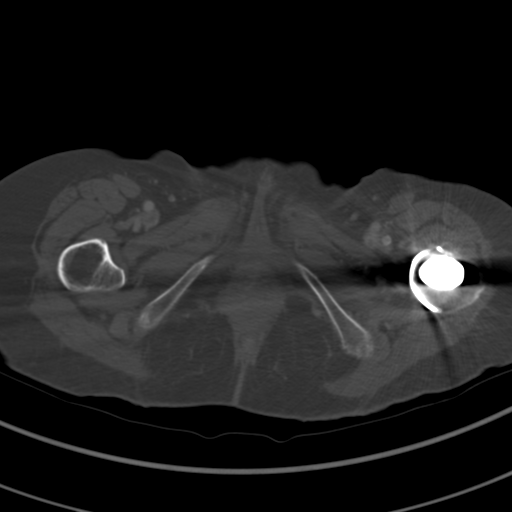
[im 15/77  soft-tissue]
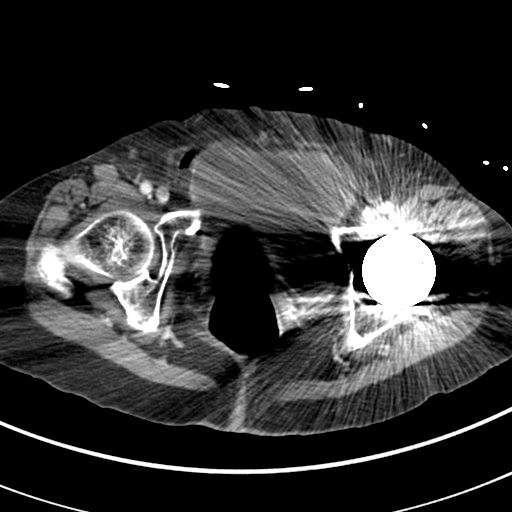
[im 24/77  soft-tissue]
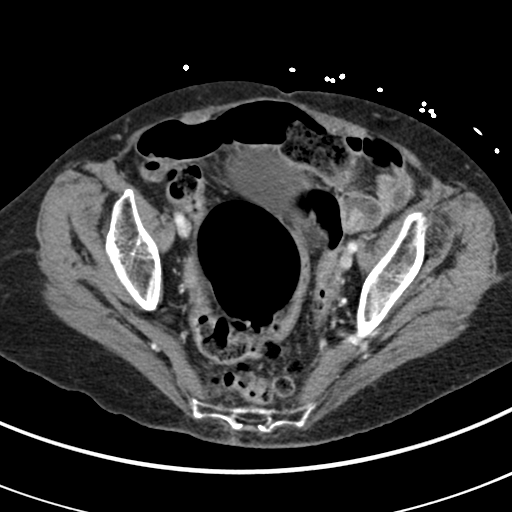
[im 29/77  soft-tissue]
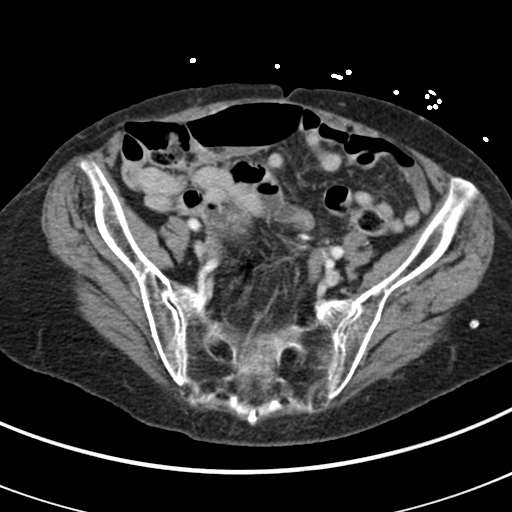
[im 39/77  soft-tissue]
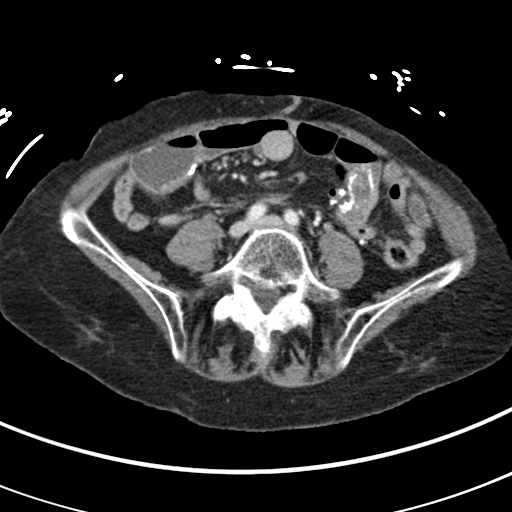
[im 48/77  soft-tissue]
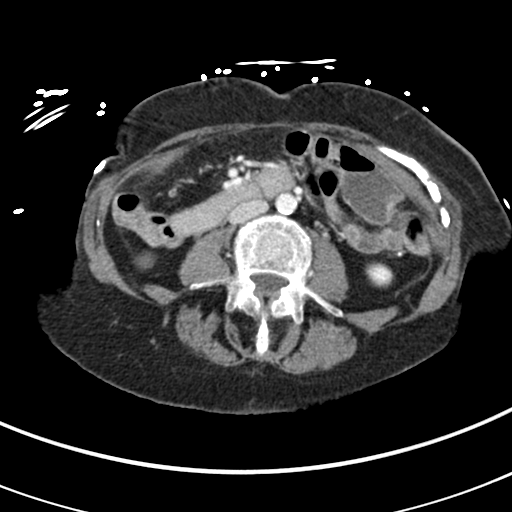
[im 53/77  soft-tissue]
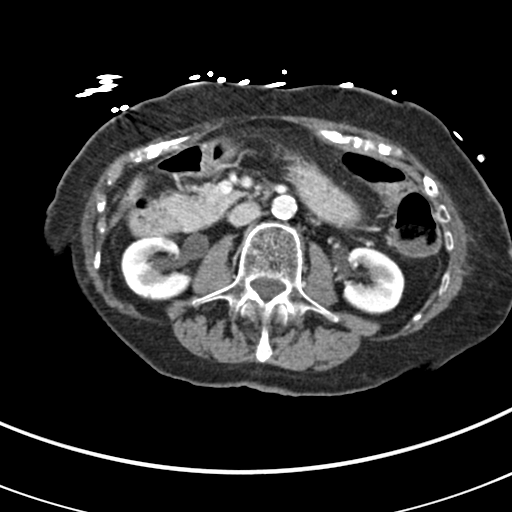
[im 58/77  lung]
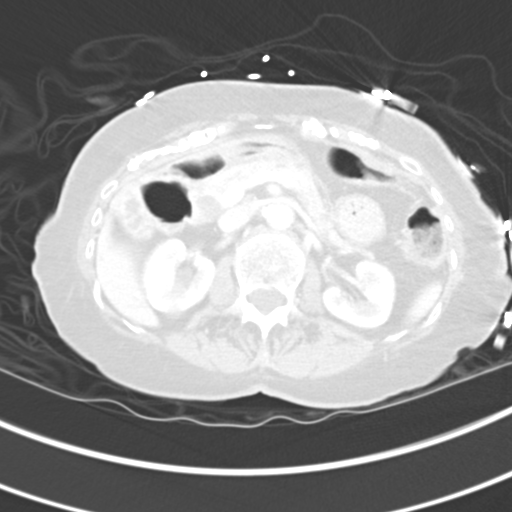
[im 62/77  soft-tissue]
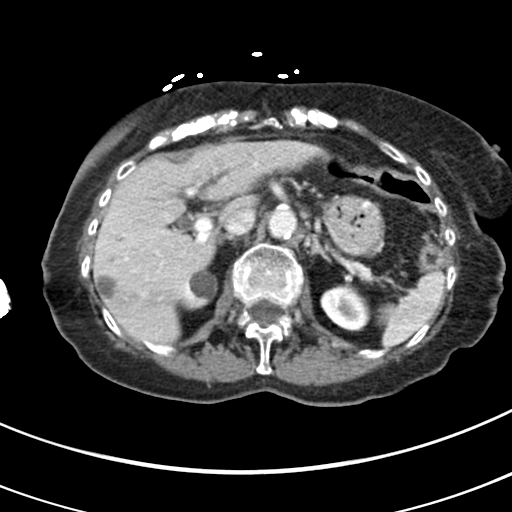
[im 62/77  lung]
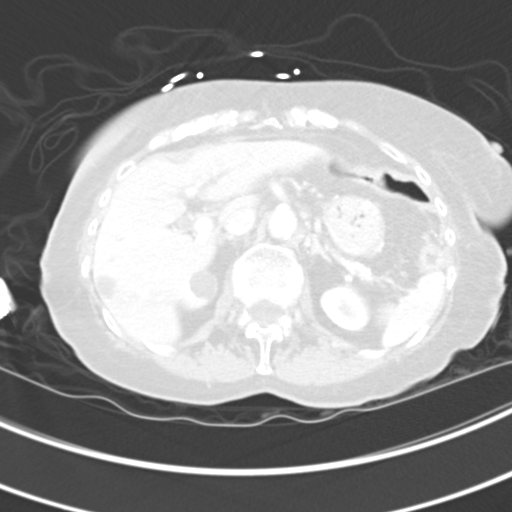
[im 67/77  lung]
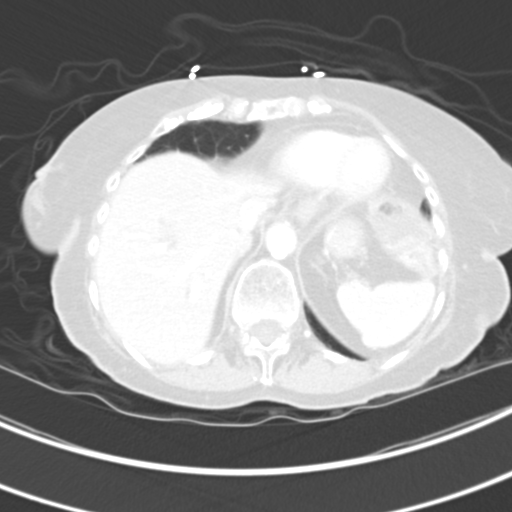
[im 72/77  soft-tissue]
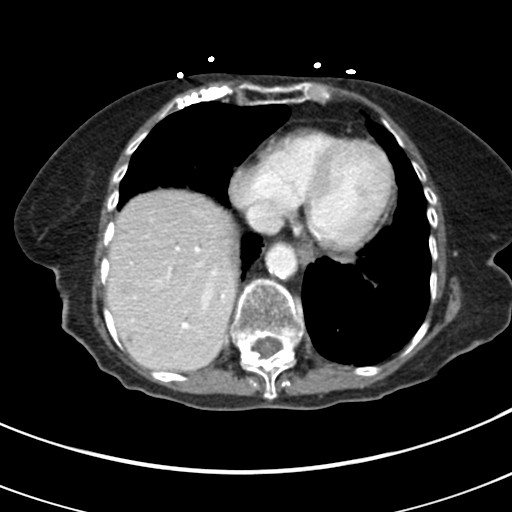
[im 72/77  lung]
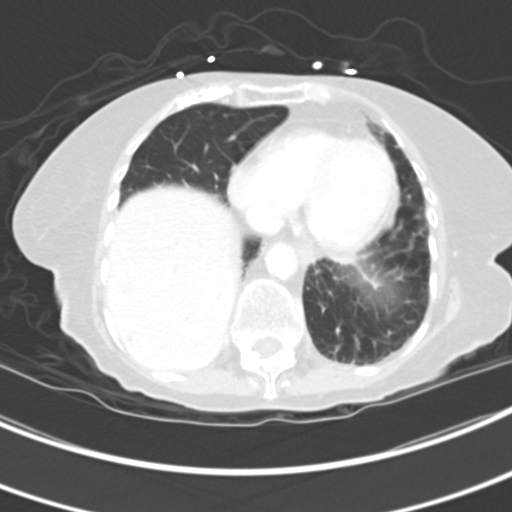
[im 72/77  bone]
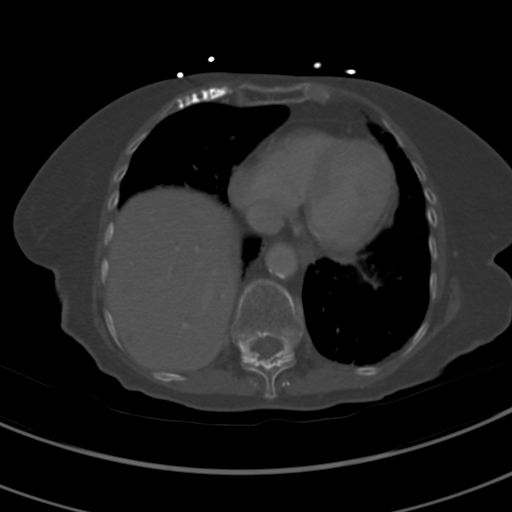

[Series 6: coronal st · coronal · 0.64mm/px · 3 of 113 slices shown]
[im 29/113  soft-tissue]
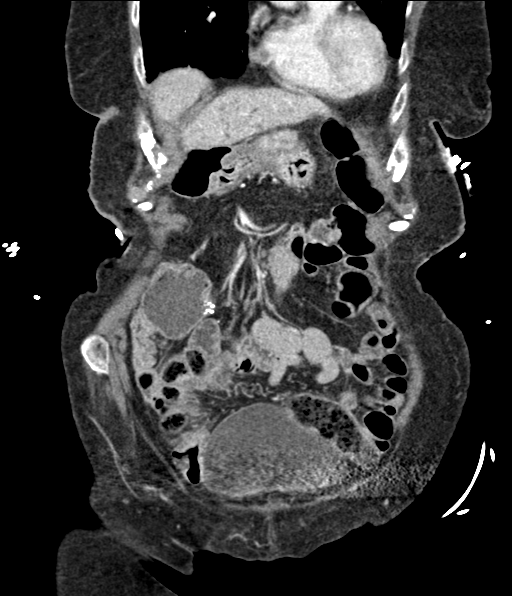
[im 57/113  soft-tissue]
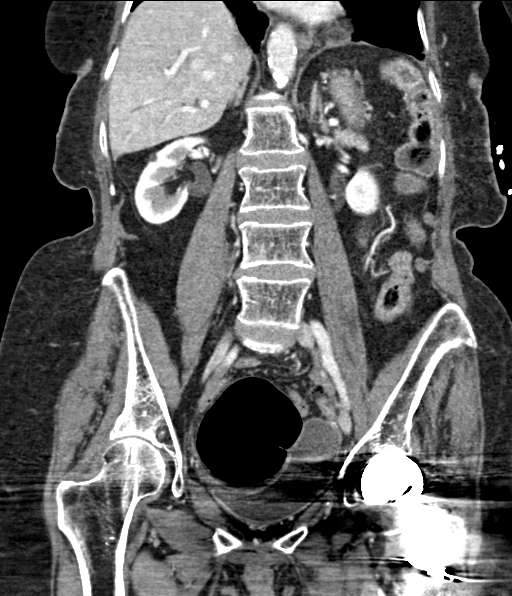
[im 85/113  soft-tissue]
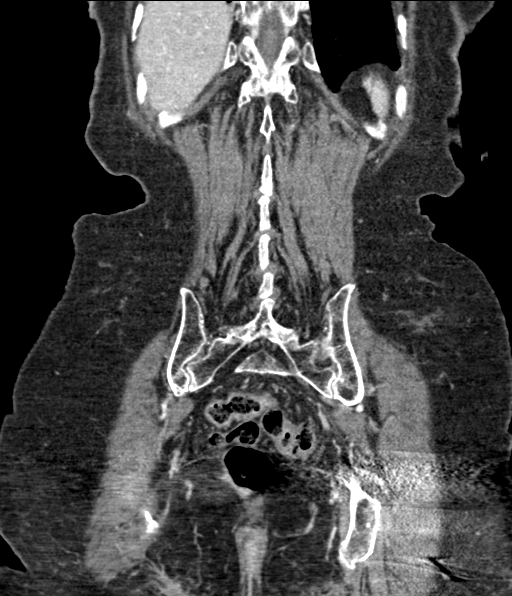

[14 of 46 positions shown; findings below may reference images not displayed]

FINDINGS: Lower chest: The visualized lung bases are clear. There is
eventration of the right hemidiaphragm. There is coronary vascular
calcification.

No intra-abdominal free air or free fluid.

Hepatobiliary: Multiple hepatic cysts and smaller hypodense liver
lesions which are not characterized on this CT. There is mild
biliary ductal dilatation, likely post cholecystectomy. No retained
calcified stone noted in the central CBD.

Pancreas: Unremarkable. No pancreatic ductal dilatation or
surrounding inflammatory changes.

Spleen: Normal in size without focal abnormality.

Adrenals/Urinary Tract: The adrenal glands are unremarkable. There
is no hydronephrosis on either side. There is symmetric enhancement
and excretion of contrast by both kidneys. Small right renal cysts.
The visualized ureters and urinary bladder appear unremarkable.

Stomach/Bowel: Severe sigmoid diverticulosis without active
inflammatory changes. There is no bowel obstruction or active
inflammation. There is a 3 cm duodenal diverticulum. The appendix is
not visualized with certainty. No inflammatory changes identified in
the right lower quadrant.

Vascular/Lymphatic: Moderate aortoiliac atherosclerotic disease. The
IVC is unremarkable. No portal venous gas. There is no adenopathy.

Reproductive: Hysterectomy.

Other: None

Musculoskeletal: Osteopenia with degenerative changes of the spine.
Total left hip arthroplasty. No acute osseous pathology.
IMPRESSION: 1. No acute intra-abdominal or pelvic pathology.
2. Severe sigmoid diverticulosis. No bowel obstruction.
3. Aortic Atherosclerosis ([id number]).

## 2023-03-28 ENCOUNTER — Ambulatory Visit: Payer: Medicare Other | Admitting: Internal Medicine

## 2023-04-06 ENCOUNTER — Telehealth: Payer: Self-pay | Admitting: Internal Medicine

## 2023-04-06 NOTE — Telephone Encounter (Signed)
Pt states that she has an upset stomach and slight abdominal pain above the waist. She is very uncomfortable and also her caregiver which is her nephew is out of town until Monday and wanted to know if there is something she can do. Pt has no transportation to come in  symptoms started last night

## 2023-04-06 NOTE — Telephone Encounter (Signed)
Pt trying to get medication for an UTI advise she have to come to get her urine tested pt stated she want to speak with the nurse. Please advise.

## 2023-04-06 NOTE — Telephone Encounter (Signed)
Ideally she should be evaluated - if the pain is above her waist this may not be a uti  - she should be evaluated in person --- see if there is anyone else that can take her someplace for evaluation

## 2023-04-08 NOTE — Telephone Encounter (Signed)
I expressed to patient that if her symptoms worsen she need to urgently  go to the ED but she also stated that she is fine now and feels alittle bit better but has no one to bring her in or take her anywhere else

## 2023-04-12 IMAGING — DX DG ABD PORTABLE 1V
1 series · 1 of 1 positions shown · non-contrast
Comparison: Radiographs 08/12/2021

CLINICAL DATA: Small bowel obstruction

EXAM:
PORTABLE ABDOMEN - 1 VIEW

[abdomen supine]
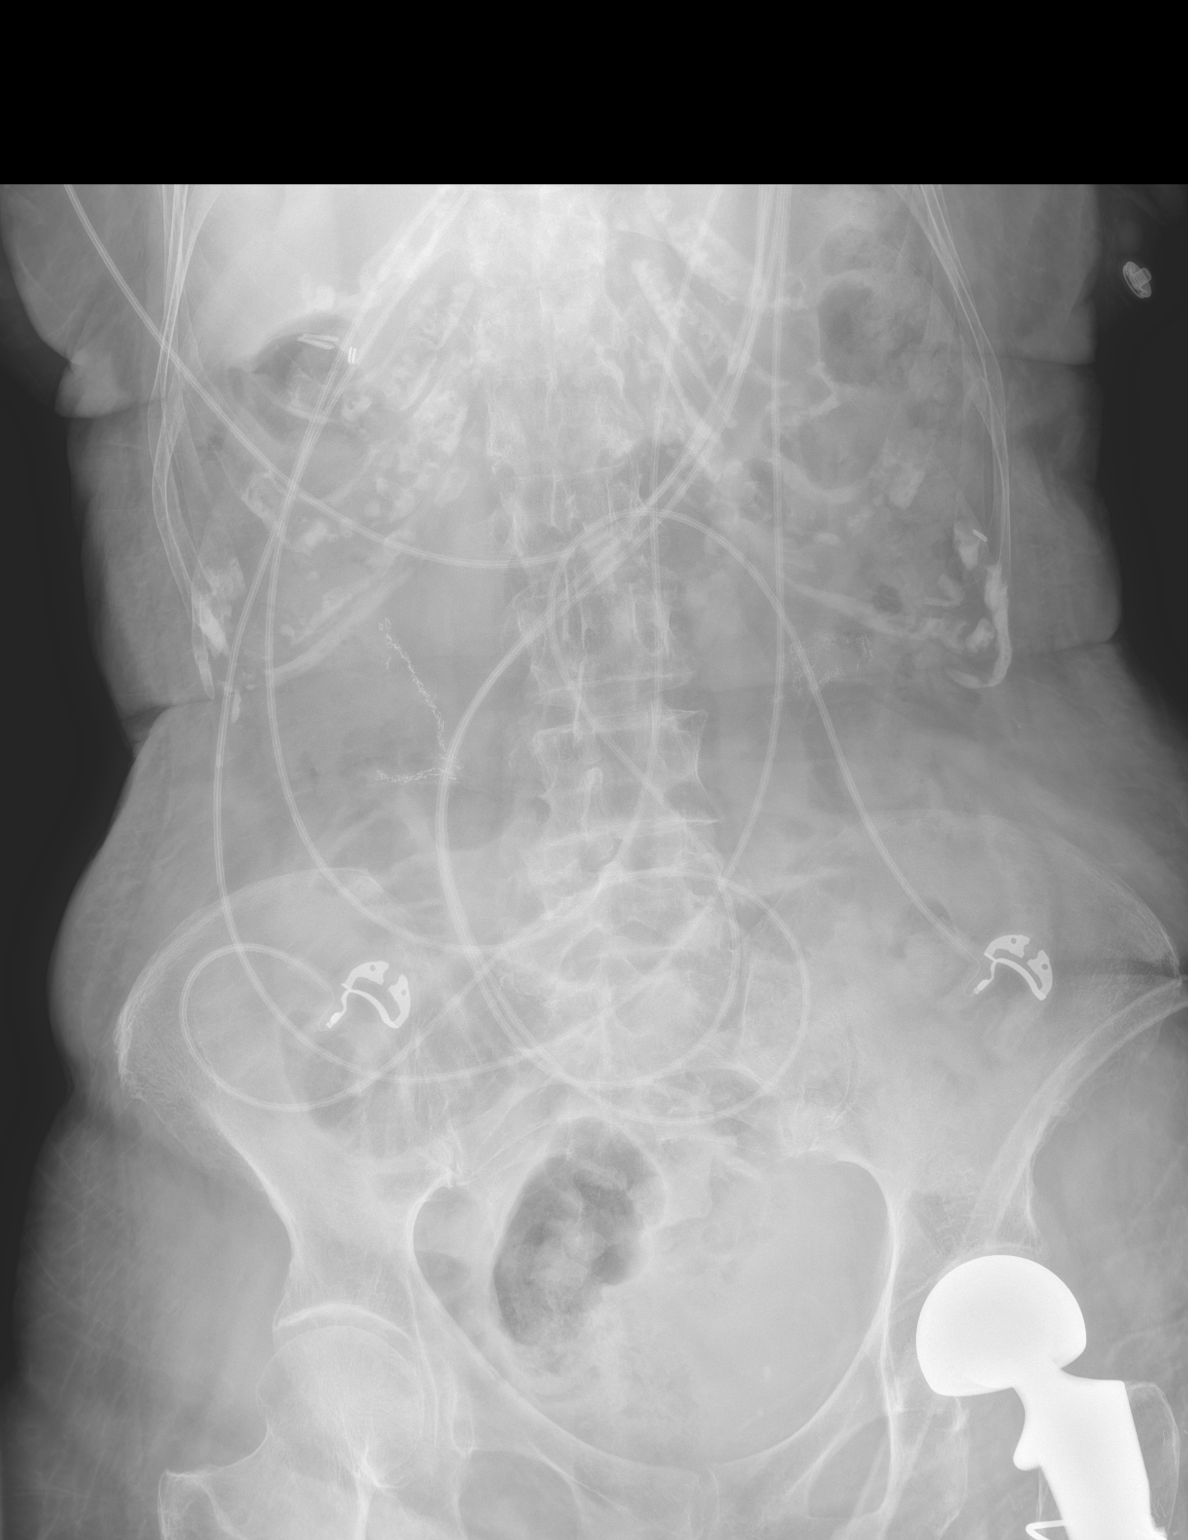

[1 of 1 positions shown; findings below may reference images not displayed]

FINDINGS: Bowel staple lines noted in the right and left abdomen; on the CT of
08/10/2021 the site of transition from dilated to nondilated small
bowel was of the small bowel anastomotic staple line in the left
abdomen.

On today's exam with identify formed stool in the colon along with
mildly dilated loops of small bowel in the right abdomen measuring
3.7 cm (formerly 4.2 cm).

Left hip prosthesis noted.
IMPRESSION: 1. Mildly improved small bowel dilatation, a mildly dilated loop of
small bowel in the right abdomen currently measures 3.7 cm in
diameter (formerly 4.2 cm).

## 2023-04-21 ENCOUNTER — Other Ambulatory Visit: Payer: Self-pay | Admitting: Internal Medicine

## 2023-04-21 ENCOUNTER — Other Ambulatory Visit: Payer: Self-pay

## 2023-04-22 ENCOUNTER — Other Ambulatory Visit (HOSPITAL_BASED_OUTPATIENT_CLINIC_OR_DEPARTMENT_OTHER): Payer: Self-pay

## 2023-04-25 ENCOUNTER — Other Ambulatory Visit: Payer: Self-pay | Admitting: Internal Medicine

## 2023-04-26 ENCOUNTER — Telehealth: Payer: Self-pay | Admitting: Internal Medicine

## 2023-04-26 ENCOUNTER — Encounter: Payer: Self-pay | Admitting: Pharmacist

## 2023-04-26 NOTE — Telephone Encounter (Signed)
Pt calling stating that she has an leakage and odor and she need an OBGYN. Pt wanted to know do she need a referral for this situation please advise.

## 2023-04-26 NOTE — Progress Notes (Signed)
Pharmacy Quality Measure Review  This patient is appearing on a report for being at risk of failing the adherence measure for cholesterol (statin) medications this calendar year.   Medication: rosuvastatin 5 mg Last fill date: 04/22/2023 for 30 day supply  Insurance report was not up to date. No action needed at this time.   Arbutus Leas, PharmD, BCPS [facility] [phone]

## 2023-04-27 ENCOUNTER — Other Ambulatory Visit (HOSPITAL_BASED_OUTPATIENT_CLINIC_OR_DEPARTMENT_OTHER): Payer: Self-pay

## 2023-04-27 ENCOUNTER — Encounter: Payer: Self-pay | Admitting: Internal Medicine

## 2023-04-27 NOTE — Telephone Encounter (Signed)
Patent states that urination has increased and she does have a odor and there is a pain in the vaginal area and pt states that she does not have a way to come in office but I did inform her to urgently to see if her nephew can bring her in but she states that he works for [employer] and its hard to get him to bring her in. She has mentioned this to [place] before and same issue was unable to brought in.. Please advise

## 2023-04-27 NOTE — Telephone Encounter (Signed)
I am unclear. Are we talking about urinary odor and leakage? Would recommend visit to rule out urine infection if so. If not provide more specific information.

## 2023-04-28 ENCOUNTER — Other Ambulatory Visit (HOSPITAL_COMMUNITY): Payer: Self-pay

## 2023-04-28 MED ORDER — LINACLOTIDE 145 MCG PO CAPS
145.0000 ug | ORAL_CAPSULE | Freq: Every day | ORAL | 3 refills | Status: DC
  Filled 2023-04-28 – 2023-04-29 (×2): qty 90, 90d supply, fill #0
  Filled 2023-09-15 – 2023-09-17 (×2): qty 90, 90d supply, fill #1

## 2023-04-29 ENCOUNTER — Other Ambulatory Visit (HOSPITAL_BASED_OUTPATIENT_CLINIC_OR_DEPARTMENT_OTHER): Payer: Self-pay

## 2023-04-29 ENCOUNTER — Other Ambulatory Visit (HOSPITAL_COMMUNITY): Payer: Self-pay

## 2023-05-02 ENCOUNTER — Other Ambulatory Visit (HOSPITAL_COMMUNITY): Payer: Self-pay

## 2023-05-02 MED ORDER — NITROFURANTOIN MONOHYD MACRO 100 MG PO CAPS
100.0000 mg | ORAL_CAPSULE | Freq: Two times a day (BID) | ORAL | 0 refills | Status: AC
  Filled 2023-05-02 – 2023-05-05 (×2): qty 10, 5d supply, fill #0

## 2023-05-02 NOTE — Telephone Encounter (Signed)
Sent in 5 day antibiotic. If no improvement should have urine sample

## 2023-05-02 NOTE — Telephone Encounter (Signed)
Patient states that she is unable to come in and I encouraged her to strongly inform her nephew to make time to bring her in pt stated that he is to busy

## 2023-05-02 NOTE — Telephone Encounter (Signed)
Called patient and informed her about her prescription and told her to strongly encourage her nephew who is her transportation to bring her in if her symptoms have not improved

## 2023-05-05 ENCOUNTER — Other Ambulatory Visit (HOSPITAL_BASED_OUTPATIENT_CLINIC_OR_DEPARTMENT_OTHER): Payer: Self-pay

## 2023-05-05 ENCOUNTER — Other Ambulatory Visit (HOSPITAL_COMMUNITY): Payer: Self-pay

## 2023-05-12 ENCOUNTER — Ambulatory Visit: Payer: Medicare Other

## 2023-05-12 VITALS — Ht 59.0 in | Wt 101.0 lb

## 2023-05-12 DIAGNOSIS — Z0000 Encounter for general adult medical examination without abnormal findings: Secondary | ICD-10-CM | POA: Diagnosis not present

## 2023-05-12 NOTE — Patient Instructions (Signed)
Tina Riley , Thank you for taking time to come for your Medicare Wellness Visit. I appreciate your ongoing commitment to your health goals. Please review the following plan we discussed and let me know if I can assist you in the future.   Referrals/Orders/Follow-Ups/Clinician Recommendations: You are due for a Flu vaccine.  I really enjoyed speaking with you today.  Keep up the good work.  This is a list of the screening recommended for you and due dates:  Health Maintenance  Topic Date Due   Zoster (Shingles) Vaccine (1 of 2) Never done   DEXA scan (bone density measurement)  Never done   Flu Shot  02/17/2023   COVID-19 Vaccine (5 - 2023-24 season) 03/20/2023   Mammogram  05/15/2023*   Medicare Annual Wellness Visit  05/11/2024   DTaP/Tdap/Td vaccine (2 - Td or Tdap) 10/09/2028   Pneumonia Vaccine  Completed   HPV Vaccine  Aged Out  *Topic was postponed. The date shown is not the original due date.    Advanced directives: (In Chart) A copy of your advanced directives are scanned into your chart should your provider ever need it.  Next Medicare Annual Wellness Visit scheduled for next year: Yes

## 2023-05-12 NOTE — Progress Notes (Signed)
Subjective:   Tina Riley is a 89 y.o. female who presents for Medicare Annual (Subsequent) preventive examination.  Visit Complete: Virtual I connected with  Tina Riley on 05/12/23 by a audio enabled telemedicine application and verified that I am speaking with the correct person using two identifiers.  Patient Location: Home  Provider Location: Home Office  I discussed the limitations of evaluation and management by telemedicine. The patient expressed understanding and agreed to proceed.  Vital Signs: Because this visit was a virtual/telehealth visit, some criteria may be missing or patient reported. Any vitals not documented were not able to be obtained and vitals that have been documented are patient reported.  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     05/12/2023    1:55 PM 12/22/2022    2:06 PM 06/25/2022    9:33 PM 05/14/2022    1:48 PM 11/03/2021   11:12 PM 08/12/2021    2:00 PM 08/12/2021   12:43 AM  Advanced Directives  Does Patient Have a Medical Advance Directive? Yes Yes No Yes No  Yes  Type of Estate agent of [place];Living will   Healthcare Power of [place];Living will   Healthcare Power of Attorney  Does patient want to make changes to medical advance directive? No - Patient declined     No - Patient declined   Copy of Healthcare Power of Attorney in Chart? Yes - validated most recent copy scanned in chart (See row information)   No - copy requested   No - copy requested  Would patient like information on creating a medical advance directive?     Yes (ED - Information included in AVS)      Current Medications (verified) Outpatient Encounter Medications as of 05/12/2023  Medication Sig    Acetaminophen Extra Strength 500 MG TABS Take 1 tablet (500 mg total) by mouth daily as needed for headache or mild pain.   albuterol (VENTOLIN HFA) 108 (90 Base) MCG/ACT inhaler Inhale 2 puffs into the lungs every 6 (six) hours as needed for shortness of breath or wheezing.   budesonide-formoterol (SYMBICORT) 160-4.5 MCG/ACT inhaler Inhale 2 puffs into the lungs 2 (two) times daily.   clonazePAM (KLONOPIN) 0.5 MG tablet Take 1 tablet (0.5 mg total) by mouth 2 (two) times daily as needed for anxiety.   dicyclomine (BENTYL) 10 MG capsule Take 1 capsule (10 mg total) by mouth 2 (two) times daily as needed for spasms.   gabapentin (NEURONTIN) 300 MG capsule Take 1 capsule (300 mg total) by mouth at bedtime.   levocetirizine (XYZAL) 5 MG tablet Take 1 tablet (5 mg total) by mouth daily.   linaclotide (LINZESS) 145 MCG CAPS capsule Take 1 capsule (145 mcg total) by mouth daily before breakfast.   loperamide (IMODIUM A-D) 2 MG tablet Take 1 tablet by mouth three times a day as needed for loose stools   melatonin 3 MG TABS tablet Take 1 tablet by mouth every night at bedtime as needed for insomnia   ondansetron (ZOFRAN-ODT) 4 MG disintegrating tablet Take 1 tablet (4 mg total) by mouth every 6 (six) hours as needed for nausea.   pantoprazole (PROTONIX) 40 MG tablet Take 1 tablet (40 mg total) by mouth 2 (two)  times daily.   Polyethyl Glycol-Propyl Glycol (SYSTANE OP) Place 1 drop into both eyes daily as needed (dry eyes).   propranolol ER (INDERAL LA) 60 MG 24 hr capsule Take 1 capsule (60 mg total) by mouth daily.   psyllium (REGULOID) 0.52 g capsule Take 2 g by mouth at bedtime. 2 gummies (Metamucil)   rosuvastatin (CRESTOR) 5 MG tablet Take 1 tablet (5 mg total) by mouth at bedtime.   SYMBICORT 160-4.5 MCG/ACT inhaler Inhale 2 puffs into the lungs 2 (two) times daily.   valACYclovir (VALTREX) 1000 MG tablet Take 1 tablet (1,000 mg total) by mouth daily.   [DISCONTINUED] acetaminophen (TYLENOL) 500 MG  tablet Take 500 mg by mouth daily as needed for headache or mild pain.   [DISCONTINUED] albuterol (VENTOLIN HFA) 108 (90 Base) MCG/ACT inhaler Inhale 2 puffs into the lungs every 6 (six) hours as needed for wheezing or shortness of breath.   [DISCONTINUED] ondansetron (ZOFRAN) 4 MG tablet Take 1 tablet (4 mg total) by mouth every 6 (six) hours as needed for nausea.   No facility-administered encounter medications on file as of 05/12/2023.    Allergies (verified) Cephalexin, Ciprofloxacin, Other, Meperidine hcl, Sulfa antibiotics, Cefdinir, and Doxycycline   History: Past Medical History:  Diagnosis Date   Arthritis    Asthma    Chicken pox    Depression    Diverticulitis    Genital warts    GERD (gastroesophageal reflux disease)    Hay fever    History of frequent urinary tract infections    Hypertension    IBS (irritable bowel syndrome)    Lactose intolerance    Peripheral neuropathy    Urinary incontinence    Past Surgical History:  Procedure Laterality Date   ABDOMINAL HYSTERECTOMY     Hip Replacement  2020   PARTIAL COLECTOMY  1980s   TONSILLECTOMY AND ADENOIDECTOMY  childhood   vocal cord tumor removal  2012   Family History  Problem Relation Age of Onset   Arthritis Mother    COPD Mother    Heart attack Father    Heart disease Father    Hypertension Father    Hyperlipidemia Father    Stroke Father    Stroke Sister    Hypertension Sister    Hyperlipidemia Sister    Hearing loss Sister    Cancer Sister    Arthritis Sister    Alcohol abuse Son    Diabetes Son    Drug abuse Son    Heart attack Son    Hypertension Son    Social History   Socioeconomic History   Marital status: Widowed    Spouse name: Not on file   Number of children: Not on file   Years of education: Not on file   Highest education level: Not on file  Occupational History   Occupation: Retired  Tobacco Use   Smoking status: Never   Smokeless tobacco: Never  Vaping Use   Vaping  status: Never Used  Substance and Sexual Activity   Alcohol use: Never   Drug use: Never   Sexual activity: Not Currently  Other Topics Concern   Not on file  Social History Narrative   She is widowed and reports having 1 son   Her nephew Marthann Schiller helps   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   Never smoker no alcohol 1 caffeinated beverage daily no drug use   She lives independently in [employer] [place] apartment   Social Determinants of  Health   Financial Resource Strain: Low Risk  (05/12/2023)   Overall Financial Resource Strain (CARDIA)    Difficulty of Paying Living Expenses: Not hard at all  Food Insecurity: No Food Insecurity (05/12/2023)   Hunger Vital Sign    Worried About Running Out of Food in the Last Year: Never true    Ran Out of Food in the Last Year: Never true  Transportation Needs: No Transportation Needs (05/12/2023)   PRAPARE - Administrator, Civil Service (Medical): No    Lack of Transportation (Non-Medical): No  Physical Activity: Inactive (05/12/2023)   Exercise Vital Sign    Days of Exercise per Week: 0 days    Minutes of Exercise per Session: 0 min  Stress: Stress Concern Present (05/12/2023)   [employer] of Occupational Health - Occupational Stress Questionnaire    Feeling of Stress : Rather much  Social Connections: Socially Isolated (05/12/2023)   Social Connection and Isolation Panel [NHANES]    Frequency of Communication with Friends and Family: Once a week    Frequency of Social Gatherings with Friends and Family: Never    Attends Religious Services: Never    Database administrator or Organizations: No    Attends Banker Meetings: Never    Marital Status: Widowed    Tobacco Counseling Counseling given: Not Answered   Clinical Intake:  Pre-visit preparation completed: Yes  Pain : No/denies pain     BMI - recorded: 20.4 Nutritional Status: BMI of 19-24  Normal Nutritional Risks:  None Diabetes: No  How often do you need to have someone help you when you read instructions, pamphlets, or other written materials from your doctor or pharmacy?: 1 - Never  Interpreter Needed?: No  Information entered by :: Kellina Dreese, RMA   Activities of Daily Living    05/12/2023    1:49 PM 06/28/2022    9:00 AM  In your present state of health, do you have any difficulty performing the following activities:  Hearing? 0 0  Vision? 0 0  Difficulty concentrating or making decisions? 0 1  Walking or climbing stairs? 1 1  Comment uses a walker   Dressing or bathing? 0 1  Doing errands, shopping? 0 1  Comment Patient's nephew   Preparing Food and eating ? N   Using the Toilet? N   In the past six months, have you accidently leaked urine? Y   Do you have problems with loss of bowel control? N   Managing your Medications? N   Managing your Finances? Y   Housekeeping or managing your Housekeeping? Y   Comment her nephew     Patient Care Team: Myrlene Broker, MD as PCP - General (Internal Medicine)  Indicate any recent Medical Services you may have received from other than [facility] providers in the past year (date may be approximate).     Assessment:   This is a routine wellness examination for Naiomy.  Hearing/Vision screen Hearing Screening - Comments:: Denies hearing difficulties   Vision Screening - Comments:: Wears eyeglasses   Goals Addressed             This Visit's Progress    Patient Stated   On track    Patient states being healthy       Depression Screen    05/12/2023    2:17 PM 03/07/2023    3:04 PM 11/30/2022    2:31 PM 11/02/2022    1:10 PM 08/02/2022  2:51 PM 05/14/2022    3:15 PM 05/14/2022    1:58 PM  PHQ 2/9 Scores  PHQ - 2 Score 0 0 0 0 0 0 0  PHQ- 9 Score 0 0 0 0 0 0     Fall Risk    05/12/2023    1:56 PM 03/07/2023    3:03 PM 11/30/2022    2:30 PM 11/02/2022    1:09 PM 08/02/2022    2:50 PM  Fall Risk   Falls in the  past year? 1 1 0 0 1  Number falls in past yr: 1 1 0 0 1  Injury with Fall? 1 0 0 0 0  Follow up Falls evaluation completed;Falls prevention discussed Falls evaluation completed Falls evaluation completed Falls evaluation completed Falls evaluation completed    MEDICARE RISK AT HOME: Medicare Risk at Home Any stairs in or around the home?: No Home free of loose throw rugs in walkways, pet beds, electrical cords, etc?: Yes Adequate lighting in your home to reduce risk of falls?: Yes Life alert?: Yes Use of a cane, walker or w/c?: Yes (walker) Grab bars in the bathroom?: Yes Shower chair or bench in shower?: Yes Elevated toilet seat or a handicapped toilet?: Yes  TIMED UP AND GO:  Was the test performed?  No    Cognitive Function:        05/12/2023    2:16 PM 05/14/2022    2:00 PM  6CIT Screen  What Year? 0 points 0 points  What month? 0 points 0 points  What time? 0 points 0 points  Count back from 20 0 points 2 points  Months in reverse 2 points 4 points  Repeat phrase 2 points 4 points  Total Score 4 points 10 points    Immunizations Immunization History  Administered Date(s) Administered   Fluad Quad(high Dose 65+) 04/09/2020, 05/04/2021, 05/14/2022   Influenza Split 05/13/2011, 04/15/2012   Influenza, High Dose Seasonal PF 05/12/2017, 04/18/2018, 04/03/2019, 04/09/2020   Influenza, Seasonal, Injecte, Preservative Fre 04/18/2014, 05/07/2015   Influenza-Unspecified 05/03/2018   PFIZER(Purple Top)SARS-COV-2 Vaccination 09/13/2019, 10/04/2019, 06/11/2020   Pfizer Covid-19 Vaccine Bivalent Booster 22yrs & up 07/08/2021   Pneumococcal Conjugate-13 12/30/2016   Pneumococcal Polysaccharide-23 07/19/2009   Tdap 10/10/2018    TDAP status: Up to date  Flu Vaccine status: Due, Education has been provided regarding the importance of this vaccine. Advised may receive this vaccine at local pharmacy or Health Dept. Aware to provide a copy of the vaccination record if  obtained from local pharmacy or Health Dept. Verbalized acceptance and understanding.  Pneumococcal vaccine status: Completed during today's visit.  Covid-19 vaccine status: Completed vaccines  Qualifies for Shingles Vaccine? Yes   Zostavax completed No   Shingrix Completed?: No.    Education has been provided regarding the importance of this vaccine. Patient has been advised to call insurance company to determine out of pocket expense if they have not yet received this vaccine. Advised may also receive vaccine at local pharmacy or Health Dept. Verbalized acceptance and understanding.  Screening Tests Health Maintenance  Topic Date Due   Zoster Vaccines- Shingrix (1 of 2) Never done   DEXA SCAN  Never done   INFLUENZA VACCINE  02/17/2023   COVID-19 Vaccine (5 - 2023-24 season) 03/20/2023   MAMMOGRAM  05/15/2023 (Originally 03/23/2018)   Medicare Annual Wellness (AWV)  05/11/2024   DTaP/Tdap/Td (2 - Td or Tdap) 10/09/2028   Pneumonia Vaccine 9+ Years old  Completed   HPV VACCINES  Aged Out    Health Maintenance  Health Maintenance Due  Topic Date Due   Zoster Vaccines- Shingrix (1 of 2) Never done   DEXA SCAN  Never done   INFLUENZA VACCINE  02/17/2023   COVID-19 Vaccine (5 - 2023-24 season) 03/20/2023    Colorectal cancer screening: No longer required.   Mammogram status: No longer required due to age.   Lung Cancer Screening: (Low Dose CT Chest recommended if Age 74-80 years, 20 pack-year currently smoking OR have quit w/in 15years.) does not qualify.   Lung Cancer Screening Referral: N/A  Additional Screening:  Hepatitis C Screening: does not qualify;   Vision Screening: Recommended annual ophthalmology exams for early detection of glaucoma and other disorders of the eye. Is the patient up to date with their annual eye exam?  Yes  Who is the provider or what is the name of the office in which the patient attends annual eye exams? Dr. Emily Filbert If pt is not established  with a provider, would they like to be referred to a provider to establish care? No .   Dental Screening: Recommended annual dental exams for proper oral hygiene   Community Resource Referral / Chronic Care Management: CRR required this visit?  No   CCM required this visit?  No     Plan:     I have personally reviewed and noted the following in the patient's chart:   Medical and social history Use of alcohol, tobacco or illicit drugs  Current medications and supplements including opioid prescriptions. Patient is not currently taking opioid prescriptions. Functional ability and status Nutritional status Physical activity Advanced directives List of other physicians Hospitalizations, surgeries, and ER visits in previous 12 months Vitals Screenings to include cognitive, depression, and falls Referrals and appointments  In addition, I have reviewed and discussed with patient certain preventive protocols, quality metrics, and best practice recommendations. A written personalized care plan for preventive services as well as general preventive health recommendations were provided to patient.     Lorcan Shelp L Garnie Borchardt, CMA   05/12/2023   After Visit Summary: (MyChart) Due to this being a telephonic visit, the after visit summary with patients personalized plan was offered to patient via MyChart   Nurse Notes: Patient is due for a Flu vaccine.  Patient stated that she has had a DEXA before when she lived in [place].  She declines the Covid vaccine and the Shingrix vaccine.  Patient had no other concerns to address today.

## 2023-05-20 ENCOUNTER — Other Ambulatory Visit (HOSPITAL_BASED_OUTPATIENT_CLINIC_OR_DEPARTMENT_OTHER): Payer: Self-pay

## 2023-05-20 ENCOUNTER — Other Ambulatory Visit: Payer: Self-pay

## 2023-05-20 ENCOUNTER — Other Ambulatory Visit: Payer: Self-pay | Admitting: Internal Medicine

## 2023-05-20 MED ORDER — GABAPENTIN 300 MG PO CAPS
300.0000 mg | ORAL_CAPSULE | Freq: Every day | ORAL | 1 refills | Status: DC
Start: 2023-05-20 — End: 2023-11-15
  Filled 2023-05-20: qty 90, 90d supply, fill #0
  Filled 2023-08-15: qty 90, 90d supply, fill #1

## 2023-05-20 MED ORDER — LEVOCETIRIZINE DIHYDROCHLORIDE 5 MG PO TABS
5.0000 mg | ORAL_TABLET | Freq: Every day | ORAL | 5 refills | Status: DC
  Filled 2023-05-20: qty 30, 30d supply, fill #0
  Filled 2023-06-11 – 2023-06-13 (×2): qty 30, 30d supply, fill #1
  Filled 2023-07-18: qty 30, 30d supply, fill #2
  Filled 2023-08-15: qty 30, 30d supply, fill #3
  Filled 2023-09-15 – 2023-09-17 (×2): qty 30, 30d supply, fill #4
  Filled 2023-10-13: qty 30, 30d supply, fill #5

## 2023-05-20 MED ORDER — ROSUVASTATIN CALCIUM 5 MG PO TABS
5.0000 mg | ORAL_TABLET | Freq: Every day | ORAL | 5 refills | Status: DC
  Filled 2023-05-20: qty 30, 30d supply, fill #0
  Filled 2023-06-11 – 2023-06-13 (×2): qty 30, 30d supply, fill #1
  Filled 2023-07-18: qty 30, 30d supply, fill #2
  Filled 2023-08-15: qty 30, 30d supply, fill #3
  Filled 2023-09-15 – 2023-09-17 (×2): qty 30, 30d supply, fill #4
  Filled 2023-10-13: qty 30, 30d supply, fill #5

## 2023-05-31 ENCOUNTER — Other Ambulatory Visit (HOSPITAL_BASED_OUTPATIENT_CLINIC_OR_DEPARTMENT_OTHER): Payer: Self-pay

## 2023-06-02 ENCOUNTER — Other Ambulatory Visit (HOSPITAL_BASED_OUTPATIENT_CLINIC_OR_DEPARTMENT_OTHER): Payer: Self-pay

## 2023-06-07 ENCOUNTER — Other Ambulatory Visit (HOSPITAL_BASED_OUTPATIENT_CLINIC_OR_DEPARTMENT_OTHER): Payer: Self-pay

## 2023-06-07 ENCOUNTER — Encounter: Payer: Self-pay | Admitting: Internal Medicine

## 2023-06-07 ENCOUNTER — Ambulatory Visit: Payer: Medicare Other | Admitting: Internal Medicine

## 2023-06-07 VITALS — BP 132/80 | HR 70 | Temp 98.6°F | Ht 59.0 in | Wt 105.0 lb

## 2023-06-07 DIAGNOSIS — E782 Mixed hyperlipidemia: Secondary | ICD-10-CM | POA: Diagnosis not present

## 2023-06-07 DIAGNOSIS — H5713 Ocular pain, bilateral: Secondary | ICD-10-CM | POA: Diagnosis not present

## 2023-06-07 DIAGNOSIS — K591 Functional diarrhea: Secondary | ICD-10-CM

## 2023-06-07 DIAGNOSIS — J4540 Moderate persistent asthma, uncomplicated: Secondary | ICD-10-CM

## 2023-06-07 DIAGNOSIS — Z23 Encounter for immunization: Secondary | ICD-10-CM | POA: Diagnosis not present

## 2023-06-07 DIAGNOSIS — L659 Nonscarring hair loss, unspecified: Secondary | ICD-10-CM

## 2023-06-07 DIAGNOSIS — E440 Moderate protein-calorie malnutrition: Secondary | ICD-10-CM

## 2023-06-07 MED ORDER — SYMBICORT 160-4.5 MCG/ACT IN AERO
2.0000 | INHALATION_SPRAY | Freq: Two times a day (BID) | RESPIRATORY_TRACT | 11 refills | Status: DC
  Filled 2023-06-07: qty 10.2, 30d supply, fill #0
  Filled 2023-06-22 – 2023-09-19 (×2): qty 10.2, 30d supply, fill #1

## 2023-06-07 NOTE — Progress Notes (Unsigned)
   Subjective:   Patient ID: Tina Riley, female    DOB: 07/09/1932, 89 y.o.   MRN: [id number]  HPI The patient is an 89 YO female coming in for medical management see A/P.   Review of Systems  Constitutional:  Positive for activity change.  HENT: Negative.    Eyes:  Positive for pain and visual disturbance.  Respiratory:  Negative for cough, chest tightness and shortness of breath.   Cardiovascular:  Negative for chest pain, palpitations and leg swelling.  Gastrointestinal:  Positive for abdominal pain, constipation and diarrhea. Negative for abdominal distention, nausea and vomiting.  Musculoskeletal:  Positive for arthralgias.  Skin: Negative.   Neurological: Negative.   Psychiatric/Behavioral: Negative.      Objective:  Physical Exam Constitutional:      Appearance: She is well-developed.  HENT:     Head: Normocephalic and atraumatic.  Cardiovascular:     Rate and Rhythm: Normal rate and regular rhythm.  Pulmonary:     Effort: Pulmonary effort is normal. No respiratory distress.     Breath sounds: Normal breath sounds. No wheezing or rales.  Abdominal:     General: Bowel sounds are normal. There is no distension.     Palpations: Abdomen is soft.     Tenderness: There is no abdominal tenderness. There is no rebound.  Musculoskeletal:     Cervical back: Normal range of motion.  Skin:    General: Skin is warm and dry.  Neurological:     Mental Status: She is alert and oriented to person, place, and time.     Coordination: Coordination abnormal.     Comments: Wheelchair today     Vitals:   06/07/23 1359  BP: 132/80  Pulse: 70  Temp: 98.6 F (37 C)  TempSrc: Oral  SpO2: 98%  Weight: 105 lb (47.6 kg)  Height: 4\' 11"  (1.499 m)    Assessment & Plan:

## 2023-06-08 DIAGNOSIS — H5710 Ocular pain, unspecified eye: Secondary | ICD-10-CM | POA: Insufficient documentation

## 2023-06-08 NOTE — Assessment & Plan Note (Signed)
Checking B12 and vitamin D and CBC and CMP. She is doing meals on wheels now and this has helped her nutrition. Adjust as needed.

## 2023-06-08 NOTE — Assessment & Plan Note (Signed)
Refilled symbicort she is out and having some coughing without it.

## 2023-06-08 NOTE — Assessment & Plan Note (Signed)
Still using fiber and linzess but this is sometimes good and sometimes not well controlled.

## 2023-06-08 NOTE — Assessment & Plan Note (Signed)
Checking lipid panel and adjust as needed. She is taking crestor 5 mg daily.

## 2023-06-08 NOTE — Assessment & Plan Note (Signed)
Suspect due to chronic dry eye which is also impacting her vision with prolonged focus. She is unsatisfied with her current eye provider and may try a second opinion. She is currently using systane otc and can continue. She has tried restasis in the past does not recall why she stopped and cannot recall if it helped eyes.

## 2023-06-08 NOTE — Assessment & Plan Note (Signed)
Checking TSH and B12 and vitamin D to ensure stability.

## 2023-06-11 ENCOUNTER — Other Ambulatory Visit (HOSPITAL_BASED_OUTPATIENT_CLINIC_OR_DEPARTMENT_OTHER): Payer: Self-pay

## 2023-06-18 ENCOUNTER — Other Ambulatory Visit (HOSPITAL_BASED_OUTPATIENT_CLINIC_OR_DEPARTMENT_OTHER): Payer: Self-pay

## 2023-06-19 ENCOUNTER — Other Ambulatory Visit: Payer: Self-pay | Admitting: Internal Medicine

## 2023-06-20 ENCOUNTER — Other Ambulatory Visit (HOSPITAL_BASED_OUTPATIENT_CLINIC_OR_DEPARTMENT_OTHER): Payer: Self-pay

## 2023-06-20 MED ORDER — PANTOPRAZOLE SODIUM 40 MG PO TBEC
40.0000 mg | DELAYED_RELEASE_TABLET | Freq: Two times a day (BID) | ORAL | 3 refills | Status: DC
  Filled 2023-06-20: qty 180, 90d supply, fill #0
  Filled 2023-09-15 – 2023-09-17 (×2): qty 180, 90d supply, fill #1
  Filled 2023-10-13 – 2023-12-17 (×3): qty 180, 90d supply, fill #2
  Filled 2024-03-22: qty 180, 90d supply, fill #3

## 2023-06-22 ENCOUNTER — Other Ambulatory Visit (HOSPITAL_BASED_OUTPATIENT_CLINIC_OR_DEPARTMENT_OTHER): Payer: Self-pay

## 2023-07-18 ENCOUNTER — Encounter: Payer: Self-pay | Admitting: Internal Medicine

## 2023-07-18 ENCOUNTER — Other Ambulatory Visit: Payer: Self-pay | Admitting: Internal Medicine

## 2023-07-19 ENCOUNTER — Other Ambulatory Visit: Payer: Self-pay

## 2023-07-19 ENCOUNTER — Other Ambulatory Visit (HOSPITAL_BASED_OUTPATIENT_CLINIC_OR_DEPARTMENT_OTHER): Payer: Self-pay

## 2023-07-19 MED ORDER — CLONAZEPAM 0.5 MG PO TABS
0.5000 mg | ORAL_TABLET | Freq: Two times a day (BID) | ORAL | 5 refills | Status: DC | PRN
  Filled 2023-07-19: qty 60, 30d supply, fill #0
  Filled 2023-09-15 – 2023-09-17 (×2): qty 60, 30d supply, fill #1
  Filled 2023-11-15: qty 60, 30d supply, fill #2
  Filled 2023-12-17 (×2): qty 60, 30d supply, fill #3

## 2023-08-15 ENCOUNTER — Other Ambulatory Visit (HOSPITAL_BASED_OUTPATIENT_CLINIC_OR_DEPARTMENT_OTHER): Payer: Self-pay

## 2023-08-15 ENCOUNTER — Other Ambulatory Visit: Payer: Self-pay

## 2023-09-02 ENCOUNTER — Encounter: Payer: Self-pay | Admitting: Internal Medicine

## 2023-09-02 ENCOUNTER — Other Ambulatory Visit (HOSPITAL_BASED_OUTPATIENT_CLINIC_OR_DEPARTMENT_OTHER): Payer: Self-pay

## 2023-09-07 NOTE — Telephone Encounter (Signed)
 Noted

## 2023-09-08 NOTE — Telephone Encounter (Signed)
Placed inside office box 

## 2023-09-16 ENCOUNTER — Other Ambulatory Visit (HOSPITAL_COMMUNITY): Payer: Self-pay

## 2023-09-16 ENCOUNTER — Other Ambulatory Visit (HOSPITAL_BASED_OUTPATIENT_CLINIC_OR_DEPARTMENT_OTHER): Payer: Self-pay

## 2023-09-16 ENCOUNTER — Other Ambulatory Visit: Payer: Self-pay

## 2023-09-17 ENCOUNTER — Other Ambulatory Visit (HOSPITAL_BASED_OUTPATIENT_CLINIC_OR_DEPARTMENT_OTHER): Payer: Self-pay

## 2023-09-19 ENCOUNTER — Encounter: Payer: Self-pay | Admitting: Internal Medicine

## 2023-09-20 ENCOUNTER — Other Ambulatory Visit (HOSPITAL_BASED_OUTPATIENT_CLINIC_OR_DEPARTMENT_OTHER): Payer: Self-pay

## 2023-09-28 ENCOUNTER — Other Ambulatory Visit (HOSPITAL_BASED_OUTPATIENT_CLINIC_OR_DEPARTMENT_OTHER): Payer: Self-pay

## 2023-10-03 ENCOUNTER — Encounter: Payer: Self-pay | Admitting: Internal Medicine

## 2023-10-03 ENCOUNTER — Other Ambulatory Visit (HOSPITAL_BASED_OUTPATIENT_CLINIC_OR_DEPARTMENT_OTHER): Payer: Self-pay

## 2023-10-03 ENCOUNTER — Ambulatory Visit: Admitting: Internal Medicine

## 2023-10-03 VITALS — BP 118/82 | HR 90 | Temp 97.8°F

## 2023-10-03 DIAGNOSIS — N3000 Acute cystitis without hematuria: Secondary | ICD-10-CM

## 2023-10-03 DIAGNOSIS — R21 Rash and other nonspecific skin eruption: Secondary | ICD-10-CM

## 2023-10-03 DIAGNOSIS — F419 Anxiety disorder, unspecified: Secondary | ICD-10-CM | POA: Diagnosis not present

## 2023-10-03 DIAGNOSIS — E441 Mild protein-calorie malnutrition: Secondary | ICD-10-CM

## 2023-10-03 MED ORDER — TRIAMCINOLONE ACETONIDE 0.1 % EX CREA
1.0000 | TOPICAL_CREAM | Freq: Two times a day (BID) | CUTANEOUS | 0 refills | Status: DC
  Filled 2023-10-03: qty 80, 14d supply, fill #0

## 2023-10-03 MED ORDER — NITROFURANTOIN MONOHYD MACRO 100 MG PO CAPS
100.0000 mg | ORAL_CAPSULE | Freq: Two times a day (BID) | ORAL | 0 refills | Status: AC
  Filled 2023-10-03: qty 10, 5d supply, fill #0

## 2023-10-03 NOTE — Patient Instructions (Signed)
 We have sent in macrobid to take 1 pill twice a day for 5 days for the urine.  We have sent in cream for the legs

## 2023-10-03 NOTE — Progress Notes (Unsigned)
   Subjective:   Patient ID: Tina Riley, female    DOB: [birth year], 89 y.o.   MRN: [id number]  HPI The patient is a 89 YO female coming in for worsening bladder issues pain and freqency. She is also having some rash on her legs. The gabapentin is not helping well for numbess. She is having some headaches as well.   Review of Systems  Constitutional:  Positive for activity change and fatigue.  HENT: Negative.    Eyes: Negative.   Respiratory:  Negative for cough, chest tightness and shortness of breath.   Cardiovascular:  Negative for chest pain, palpitations and leg swelling.  Gastrointestinal:  Positive for diarrhea. Negative for abdominal distention, abdominal pain, constipation, nausea and vomiting.  Genitourinary:  Positive for dysuria and frequency.  Musculoskeletal:  Positive for arthralgias.  Skin: Negative.   Neurological:  Positive for numbness.  Psychiatric/Behavioral:  The patient is nervous/anxious.     Objective:  Physical Exam Constitutional:      Appearance: She is well-developed.  HENT:     Head: Normocephalic and atraumatic.  Cardiovascular:     Rate and Rhythm: Normal rate and regular rhythm.  Pulmonary:     Effort: Pulmonary effort is normal. No respiratory distress.     Breath sounds: Normal breath sounds. No wheezing or rales.  Abdominal:     General: Bowel sounds are normal. There is no distension.     Palpations: Abdomen is soft.     Tenderness: There is no abdominal tenderness. There is no rebound.  Musculoskeletal:     Cervical back: Normal range of motion.  Skin:    General: Skin is warm and dry.  Neurological:     Mental Status: She is alert and oriented to person, place, and time.     Coordination: Coordination abnormal.     Vitals:   10/03/23 1527  BP: 118/82  Pulse: 90  Temp: 97.8 F (36.6 C)  TempSrc: Oral  SpO2: 99%    Assessment & Plan:

## 2023-10-05 ENCOUNTER — Ambulatory Visit: Payer: Self-pay

## 2023-10-05 NOTE — Telephone Encounter (Signed)
  Chief Complaint: does not want to keep taking her macrobid as it causes her diarrhea Symptoms: diarrhea about 2.5 hours after taking medication Frequency: since starting med on 3/17  Disposition: [] ED /[] Urgent Care (no appt availability in office) / [] Appointment(In office/virtual)/ []  [facility]/ [] Home Care/ [] Refused Recommended Disposition /[] [facility]/ [x]  Follow-up with PCP  Additional Notes: Pt states she is taking macrobid and it is causing her diarrhea.  Pt states she is also weak, but can still walk normal, pt advised to call 911 if she feels to weak to walk. Pt states that the medication was not helping with her UTI s/s and that she would like to stop the abx.  Pt states she lives alone and is afraid she will fall trying to rush to the bathroom with diarrhea.  Pt states that she would like to not take any abx, RN educated pt on needing abx.  Pt states that she has incontinence "urinary issues and would rather have that than to be stooling myself". Pt states "I will be stopping the medication, if the doctor would like me on something else have her call my nephew Tina Riley".  Copied from CRM [id number]. Topic: Clinical - Red Word Triage >> Oct 05, 2023  4:23 PM Tina Riley wrote: [facility] that prompted transfer to Nurse Triage: Taking it because bladder was over working, but it is upsetting her stomach really bad, wants a nurse to call her on what she needs to do because it has been making her run to the bathroom and makes her weak.  nitrofurantoin, macrocrystal-monohydrate, (MACROBID) 100 MG capsule Reason for Disposition . [1] Caller has URGENT medicine question about med that PCP or specialist prescribed AND [2] triager unable to answer question  Answer Assessment - Initial Assessment Questions 1. NAME of MEDICINE: "What medicine(s) are you calling about?"     macrobid 2. QUESTION: "What is your question?" (e.g., double dose of medicine, side effect)     "Im  stopping the medication, it is making my colon work to hard" 4. SYMPTOMS: "Do you have any symptoms?" If Yes, ask: "What symptoms are you having?"  "How bad are the symptoms (e.g., mild, moderate, severe)     Diarrhea d/t medication  Protocols used: Medication Question Call-A-AH

## 2023-10-06 NOTE — Telephone Encounter (Signed)
 Ok to stop

## 2023-10-07 DIAGNOSIS — R21 Rash and other nonspecific skin eruption: Secondary | ICD-10-CM | POA: Insufficient documentation

## 2023-10-07 DIAGNOSIS — N3000 Acute cystitis without hematuria: Secondary | ICD-10-CM | POA: Insufficient documentation

## 2023-10-07 NOTE — Assessment & Plan Note (Signed)
 Stable overall and using clonazepam 0.5 mg BID and has tried many controlling agents over the years without success.

## 2023-10-07 NOTE — Assessment & Plan Note (Signed)
 Rx triamcinolone to help with legs. Suspect some of the change is due to some 3rd spacing due to low albumin and chronicity of that.

## 2023-10-07 NOTE — Assessment & Plan Note (Signed)
 She is doing well with meals on wheels and her aide is able to prep meals for her as well.

## 2023-10-07 NOTE — Assessment & Plan Note (Signed)
 Likely with symptoms rx macrobid 5 day.

## 2023-10-14 ENCOUNTER — Other Ambulatory Visit (HOSPITAL_BASED_OUTPATIENT_CLINIC_OR_DEPARTMENT_OTHER): Payer: Self-pay

## 2023-10-14 ENCOUNTER — Other Ambulatory Visit: Payer: Self-pay

## 2023-10-17 ENCOUNTER — Other Ambulatory Visit (HOSPITAL_BASED_OUTPATIENT_CLINIC_OR_DEPARTMENT_OTHER): Payer: Self-pay

## 2023-11-15 ENCOUNTER — Other Ambulatory Visit: Payer: Self-pay | Admitting: Internal Medicine

## 2023-11-16 ENCOUNTER — Other Ambulatory Visit: Payer: Self-pay

## 2023-11-16 ENCOUNTER — Other Ambulatory Visit (HOSPITAL_BASED_OUTPATIENT_CLINIC_OR_DEPARTMENT_OTHER): Payer: Self-pay

## 2023-11-17 ENCOUNTER — Other Ambulatory Visit (HOSPITAL_BASED_OUTPATIENT_CLINIC_OR_DEPARTMENT_OTHER): Payer: Self-pay

## 2023-11-17 MED ORDER — GABAPENTIN 300 MG PO CAPS
300.0000 mg | ORAL_CAPSULE | Freq: Every day | ORAL | 1 refills | Status: DC
  Filled 2023-11-17: qty 90, 90d supply, fill #0
  Filled 2024-02-23: qty 90, 90d supply, fill #1

## 2023-11-17 MED ORDER — ROSUVASTATIN CALCIUM 5 MG PO TABS
5.0000 mg | ORAL_TABLET | Freq: Every day | ORAL | 5 refills | Status: DC
  Filled 2023-11-17: qty 30, 30d supply, fill #0
  Filled 2023-12-17 (×2): qty 30, 30d supply, fill #1
  Filled 2024-01-20: qty 30, 30d supply, fill #2
  Filled 2024-02-23: qty 30, 30d supply, fill #3
  Filled 2024-03-22: qty 60, 60d supply, fill #4

## 2023-11-17 MED ORDER — LEVOCETIRIZINE DIHYDROCHLORIDE 5 MG PO TABS
5.0000 mg | ORAL_TABLET | Freq: Every day | ORAL | 5 refills | Status: DC
  Filled 2023-11-17: qty 30, 30d supply, fill #0
  Filled 2023-12-17 (×2): qty 30, 30d supply, fill #1
  Filled 2024-01-20: qty 30, 30d supply, fill #2
  Filled 2024-02-23: qty 30, 30d supply, fill #3
  Filled 2024-03-22: qty 60, 60d supply, fill #4

## 2023-12-17 ENCOUNTER — Other Ambulatory Visit (HOSPITAL_BASED_OUTPATIENT_CLINIC_OR_DEPARTMENT_OTHER): Payer: Self-pay

## 2023-12-19 ENCOUNTER — Other Ambulatory Visit: Payer: Self-pay

## 2023-12-20 ENCOUNTER — Other Ambulatory Visit (HOSPITAL_BASED_OUTPATIENT_CLINIC_OR_DEPARTMENT_OTHER): Payer: Self-pay

## 2023-12-20 ENCOUNTER — Telehealth: Payer: Self-pay | Admitting: Internal Medicine

## 2023-12-20 MED ORDER — MUPIROCIN 2 % EX OINT
1.0000 | TOPICAL_OINTMENT | Freq: Two times a day (BID) | CUTANEOUS | 0 refills | Status: DC
  Filled 2023-12-20: qty 22, 11d supply, fill #0

## 2023-12-20 NOTE — Telephone Encounter (Signed)
 Called patient back and advised her as to what provider recommended

## 2023-12-20 NOTE — Telephone Encounter (Signed)
 Copied from CRM [id number]. Topic: Clinical - Medical Advice >> Dec 20, 2023  8:30 AM Danae Duncans wrote: Reason for CRM: Pt request to speak to nurse over medical concern, pt would like callback [phone].

## 2023-12-20 NOTE — Telephone Encounter (Signed)
 Pt states that she has another boil that occurs on bottom area and she states that it is starting to bleed and its very painful when she sits and wants to know what can she do

## 2023-12-20 NOTE — Telephone Encounter (Signed)
 Copied from CRM [id number]. Topic: Clinical - Medical Advice >> Dec 20, 2023  8:30 AM Danae Duncans wrote: Reason for CRM: Pt request to speak to nurse over medical concern, pt would like callback [phone]. >> Dec 20, 2023  3:22 PM Trula Gable C wrote: Patient called in wanting to speak to a nurse, please call patient to followup

## 2023-12-20 NOTE — Telephone Encounter (Signed)
 Can do warm sitz bath to help. Have sent in ointment to use topically on it.

## 2024-01-20 ENCOUNTER — Other Ambulatory Visit: Payer: Self-pay | Admitting: Internal Medicine

## 2024-01-23 ENCOUNTER — Other Ambulatory Visit: Payer: Self-pay

## 2024-01-24 ENCOUNTER — Other Ambulatory Visit (HOSPITAL_BASED_OUTPATIENT_CLINIC_OR_DEPARTMENT_OTHER): Payer: Self-pay

## 2024-01-24 MED ORDER — PROPRANOLOL HCL ER 60 MG PO CP24
60.0000 mg | ORAL_CAPSULE | Freq: Every day | ORAL | 3 refills | Status: AC
  Filled 2024-01-24: qty 90, 90d supply, fill #0
  Filled 2024-04-16: qty 90, 90d supply, fill #1
  Filled 2024-07-29: qty 90, 90d supply, fill #2

## 2024-01-24 MED ORDER — VALACYCLOVIR HCL 1 G PO TABS
1000.0000 mg | ORAL_TABLET | Freq: Every day | ORAL | 3 refills | Status: DC
  Filled 2024-01-24: qty 90, 90d supply, fill #0
  Filled 2024-04-16: qty 90, 90d supply, fill #1

## 2024-01-24 MED ORDER — CLONAZEPAM 0.5 MG PO TABS
0.5000 mg | ORAL_TABLET | Freq: Two times a day (BID) | ORAL | 5 refills | Status: DC | PRN
  Filled 2024-01-24: qty 60, 30d supply, fill #0
  Filled 2024-05-01: qty 60, 30d supply, fill #1
  Filled 2024-07-03: qty 60, 30d supply, fill #2
  Filled 2024-07-19: qty 60, 30d supply, fill #3

## 2024-01-25 ENCOUNTER — Other Ambulatory Visit (HOSPITAL_BASED_OUTPATIENT_CLINIC_OR_DEPARTMENT_OTHER): Payer: Self-pay

## 2024-02-13 ENCOUNTER — Ambulatory Visit: Payer: Self-pay

## 2024-02-13 ENCOUNTER — Encounter: Payer: Self-pay | Admitting: Family Medicine

## 2024-02-13 ENCOUNTER — Ambulatory Visit: Admitting: Family Medicine

## 2024-02-13 ENCOUNTER — Other Ambulatory Visit (HOSPITAL_BASED_OUTPATIENT_CLINIC_OR_DEPARTMENT_OTHER): Payer: Self-pay

## 2024-02-13 ENCOUNTER — Other Ambulatory Visit: Payer: Self-pay

## 2024-02-13 VITALS — BP 128/62 | HR 63 | Temp 97.6°F | Resp 18 | Ht 59.0 in | Wt 106.0 lb

## 2024-02-13 DIAGNOSIS — J4540 Moderate persistent asthma, uncomplicated: Secondary | ICD-10-CM | POA: Diagnosis not present

## 2024-02-13 DIAGNOSIS — B009 Herpesviral infection, unspecified: Secondary | ICD-10-CM | POA: Diagnosis not present

## 2024-02-13 DIAGNOSIS — R519 Headache, unspecified: Secondary | ICD-10-CM

## 2024-02-13 MED ORDER — ONDANSETRON 4 MG PO TBDP
4.0000 mg | ORAL_TABLET | Freq: Four times a day (QID) | ORAL | 0 refills | Status: AC | PRN
  Filled 2024-02-13: qty 120, 30d supply, fill #0

## 2024-02-13 MED ORDER — TRELEGY ELLIPTA 100-62.5-25 MCG/ACT IN AEPB
1.0000 | INHALATION_SPRAY | Freq: Every day | RESPIRATORY_TRACT | 2 refills | Status: DC
  Filled 2024-02-13: qty 60, 30d supply, fill #0

## 2024-02-13 MED ORDER — ALBUTEROL SULFATE HFA 108 (90 BASE) MCG/ACT IN AERS
2.0000 | INHALATION_SPRAY | Freq: Four times a day (QID) | RESPIRATORY_TRACT | 0 refills | Status: AC | PRN
  Filled 2024-02-13: qty 6.7, 25d supply, fill #0

## 2024-02-13 NOTE — Progress Notes (Signed)
 Assessment & Plan:  1. HSV (herpes simplex virus) infection (Primary) Continue Valtrex  daily.  Recommended END-IT cream.  2. Moderate persistent asthma without complication Uncontrolled with Symbicort .  Discontinue Symbicort  and start Trelegy daily.  Continue albuterol  as needed. - albuterol  (VENTOLIN  HFA) 108 (90 Base) MCG/ACT inhaler; Inhale 2 puffs into the lungs every 6 (six) hours as needed for shortness of breath or wheezing.  Dispense: 6.7 g; Refill: 0 - Fluticasone -Umeclidin-Vilant (TRELEGY ELLIPTA ) 100-62.5-25 MCG/ACT AEPB; Inhale 1 puff into the lungs daily.  Dispense: 60 each; Refill: 2  3. Daily headache Encouraged patient to follow-up after updating her glasses prescription if she is continuing to have regular headaches.    Follow up plan: Return in about 4 weeks (around 03/12/2024), or if symptoms worsen or fail to improve, for asthma w. PCP.  Niki Rung, MSN, APRN, FNP-C  Subjective:  HPI: Tina Riley is a 89 y.o. female presenting on 02/13/2024 for Asthma (Asthma flare ups recently and she is out of albuterol  ), Wound Check (Wounds on buttocks - noticed about 1 week ago - hurts and having to sit on hip to relieve pain. Some open and bleeding. Thinks she has 2 sores. ), and Headache (HAs daily - front to back of head = taking tylenol  for this )  Patient is accompanied by her niece, who she is okay with being present.  Asthma: Symbicort  as prescribed; Albuterol  as needed which 3-4 times per week and always at night.   Wound: Patient reports she has genital herpes for which she takes Valtrex  1,000 mg daily.  States she is currently having an outbreak with one spot on each side of her buttocks.  She states it started on the right side which bled and then resolved, but now it is on the left side.  She has been applying mupirocin  ointment.  States it is hard to sit down due to the tenderness.  Headache; patient reports daily headaches for which she is taking  Tylenol  twice daily, which does relieve the pain.  She believes this is due to her macular degeneration and wearing glasses that are 42 to 88 years old.  She does have an upcoming eye doctor appointment.    ROS: Negative unless specifically indicated above in HPI.   Relevant past medical history reviewed and updated as indicated.   Allergies and medications reviewed and updated.   Current Outpatient Medications:    Acetaminophen  Extra Strength 500 MG TABS, Take 1 tablet (500 mg total) by mouth daily as needed for headache or mild pain., Disp: 30 tablet, Rfl: 0   albuterol  (VENTOLIN  HFA) 108 (90 Base) MCG/ACT inhaler, Inhale 2 puffs into the lungs every 6 (six) hours as needed for shortness of breath or wheezing., Disp: 6.7 g, Rfl: 0   budesonide -formoterol  (SYMBICORT ) 160-4.5 MCG/ACT inhaler, Inhale 2 puffs into the lungs 2 (two) times daily., Disp: 10.2 g, Rfl: 0   clonazePAM  (KLONOPIN ) 0.5 MG tablet, Take 1 tablet (0.5 mg total) by mouth 2 (two) times daily as needed for anxiety., Disp: 60 tablet, Rfl: 5   dicyclomine  (BENTYL ) 10 MG capsule, Take 1 capsule (10 mg total) by mouth 2 (two) times daily as needed for spasms., Disp: 180 capsule, Rfl: 3   gabapentin  (NEURONTIN ) 300 MG capsule, Take 1 capsule (300 mg total) by mouth at bedtime., Disp: 90 capsule, Rfl: 1   levocetirizine (XYZAL ) 5 MG tablet, Take 1 tablet (5 mg total) by mouth daily., Disp: 30 tablet, Rfl: 5   mupirocin  ointment (  BACTROBAN ) 2 %, Apply 1 Application topically 2 (two) times daily., Disp: 22 g, Rfl: 0   ondansetron  (ZOFRAN -ODT) 4 MG disintegrating tablet, Take 1 tablet (4 mg total) by mouth every 6 (six) hours as needed for nausea., Disp: 120 tablet, Rfl: 0   pantoprazole  (PROTONIX ) 40 MG tablet, Take 1 tablet (40 mg total) by mouth 2 (two) times daily., Disp: 180 tablet, Rfl: 3   Polyethyl Glycol-Propyl Glycol (SYSTANE OP), Place 1 drop into both eyes daily as needed (dry eyes)., Disp: , Rfl:    propranolol  ER (INDERAL   LA) 60 MG 24 hr capsule, Take 1 capsule (60 mg total) by mouth daily., Disp: 90 capsule, Rfl: 3   rosuvastatin  (CRESTOR ) 5 MG tablet, Take 1 tablet (5 mg total) by mouth at bedtime., Disp: 30 tablet, Rfl: 5   triamcinolone  cream (KENALOG ) 0.1 %, Apply 1 Application topically 2 (two) times daily., Disp: 80 g, Rfl: 0   valACYclovir  (VALTREX ) 1000 MG tablet, Take 1 tablet (1,000 mg total) by mouth daily., Disp: 90 tablet, Rfl: 3   loperamide  (IMODIUM  A-D) 2 MG tablet, Take 1 tablet by mouth three times a day as needed for loose stools (Patient not taking: Reported on 02/13/2024), Disp: 90 tablet, Rfl: 0   melatonin 3 MG TABS tablet, Take 1 tablet by mouth every night at bedtime as needed for insomnia (Patient not taking: Reported on 02/13/2024), Disp: 30 tablet, Rfl: 0   psyllium (REGULOID) 0.52 g capsule, Take 2 g by mouth at bedtime. 2 gummies (Metamucil) (Patient not taking: Reported on 02/13/2024), Disp: , Rfl:   Allergies  Allergen Reactions   Cephalexin Palpitations   Ciprofloxacin Palpitations   Other Itching   Meperidine Hcl     Other reaction(s): Other Doesn't work   Sulfa Antibiotics Nausea And Vomiting   Cefdinir  Nausea Only   Doxycycline Nausea Only    Objective:   BP 128/62   Pulse 63   Temp 97.6 F (36.4 C)   Resp 18   Ht 4' 11 (1.499 m)   Wt 106 lb (48.1 kg)   SpO2 96%   BMI 21.41 kg/m    Physical Exam Vitals reviewed.  Constitutional:      General: She is not in acute distress.    Appearance: Normal appearance. She is not ill-appearing, toxic-appearing or diaphoretic.  HENT:     Head: Normocephalic and atraumatic.  Eyes:     General: No scleral icterus.       Right eye: No discharge.        Left eye: No discharge.     Conjunctiva/sclera: Conjunctivae normal.  Cardiovascular:     Rate and Rhythm: Normal rate.  Pulmonary:     Effort: Pulmonary effort is normal. No respiratory distress.  Genitourinary:  Musculoskeletal:        General: Normal range of  motion.     Cervical back: Normal range of motion.  Skin:    General: Skin is warm and dry.     Capillary Refill: Capillary refill takes less than 2 seconds.  Neurological:     General: No focal deficit present.     Mental Status: She is alert and oriented to person, place, and time. Mental status is at baseline.  Psychiatric:        Mood and Affect: Mood normal.        Behavior: Behavior normal.        Thought Content: Thought content normal.        Judgment: Judgment normal.

## 2024-02-13 NOTE — Telephone Encounter (Signed)
 FYI Only or Action Required?: FYI only for provider.  Patient was last seen in primary care on 10/03/2023 by [name], MD.  Called Nurse Triage reporting Abscess.  Symptoms began several days ago.  Interventions attempted: OTC medications: Neosporin, Tylenol .  Symptoms are: right buttock suspected abscess with discomfort unchanged.  Triage Disposition: See PCP When Office is Open (Within 3 Days)  Patient/caregiver understands and will follow disposition?: Yes                  Copied from CRM #[id number]. Topic: Clinical - Red Word Triage >> Feb 13, 2024  8:09 AM Avram MATSU wrote: Red Word that prompted transfer to Nurse Triage: painful boils on bottom Reason for Disposition  [1] Boil AND [2] not improved > 3 days following Care Advice  Answer Assessment - Initial Assessment Questions 1. APPEARANCE of BOIL: What does the boil look like?      She states the patient has not shown it to her.  2. LOCATION: Where is the boil located?      She states she thinks it is on the patient's right side buttocks.  3. NUMBER: How many boils are there?      She states she just knows of one, but there may be more.  4. SIZE: How big is the boil? (e.g., inches, cm; compare to size of a coin or other object)     Unsure.  5. ONSET: When did the boil start?     She states she was not aware of it until this weekend and unsure total how long patient has had it.  6. PAIN: Is there any pain? If Yes, ask: How bad is the pain?   (Scale 1-10; or mild, moderate, severe)     Yes, unsure severity but she states patient has been trying to shift her weight off her buttocks due to pain.  7. FEVER: Do you have a fever? If Yes, ask: What is it, how was it measured, and when did it start?     She states last week the patient was warmer than usual but denies any known fever.  8. SOURCE: Have you been around anyone with boils or other Staph infections? Have you ever had  boils before?     She has had them in the past with [facility].  9. OTHER SYMPTOMS: Do you have any other symptoms? (e.g., shaking chills, weakness, rash elsewhere on body)     No.  10. PREGNANCY: Is there any chance you are pregnant? When was your last menstrual period?       N/A.  Protocols used: Boil (Skin Abscess)-A-AH

## 2024-02-13 NOTE — Patient Instructions (Signed)
 SABRA

## 2024-02-22 ENCOUNTER — Other Ambulatory Visit (HOSPITAL_BASED_OUTPATIENT_CLINIC_OR_DEPARTMENT_OTHER): Payer: Self-pay

## 2024-02-22 MED ORDER — ERYTHROMYCIN 5 MG/GM OP OINT
1.0000 | TOPICAL_OINTMENT | Freq: Every day | OPHTHALMIC | 6 refills | Status: AC
  Filled 2024-02-22: qty 3.5, 30d supply, fill #0

## 2024-02-24 ENCOUNTER — Other Ambulatory Visit: Payer: Self-pay | Admitting: Internal Medicine

## 2024-02-24 ENCOUNTER — Other Ambulatory Visit (HOSPITAL_BASED_OUTPATIENT_CLINIC_OR_DEPARTMENT_OTHER): Payer: Self-pay

## 2024-02-27 ENCOUNTER — Other Ambulatory Visit (HOSPITAL_BASED_OUTPATIENT_CLINIC_OR_DEPARTMENT_OTHER): Payer: Self-pay

## 2024-02-27 MED ORDER — LINACLOTIDE 145 MCG PO CAPS
145.0000 ug | ORAL_CAPSULE | Freq: Every day | ORAL | 3 refills | Status: AC
  Filled 2024-02-27: qty 90, 90d supply, fill #0

## 2024-03-09 ENCOUNTER — Emergency Department (HOSPITAL_COMMUNITY)

## 2024-03-09 ENCOUNTER — Other Ambulatory Visit: Payer: Self-pay

## 2024-03-09 ENCOUNTER — Emergency Department (HOSPITAL_COMMUNITY)
Admission: EM | Admit: 2024-03-09 | Discharge: 2024-03-09 | Disposition: A | Discharge status: Home / Self Care | Attending: Emergency Medicine | Admitting: Emergency Medicine

## 2024-03-09 ENCOUNTER — Encounter (HOSPITAL_COMMUNITY): Payer: Self-pay

## 2024-03-09 ENCOUNTER — Ambulatory Visit: Payer: Self-pay

## 2024-03-09 DIAGNOSIS — R748 Abnormal levels of other serum enzymes: Secondary | ICD-10-CM | POA: Diagnosis not present

## 2024-03-09 DIAGNOSIS — R079 Chest pain, unspecified: Secondary | ICD-10-CM | POA: Insufficient documentation

## 2024-03-09 DIAGNOSIS — R519 Headache, unspecified: Secondary | ICD-10-CM | POA: Diagnosis not present

## 2024-03-09 LAB — COMPREHENSIVE METABOLIC PANEL WITH GFR
ALT: 12 U/L (ref 0–44)
AST: 23 U/L (ref 15–41)
Albumin: 3.5 g/dL (ref 3.5–5.0)
Alkaline Phosphatase: 96 U/L (ref 38–126)
Anion gap: 10 (ref 5–15)
BUN: 14 mg/dL (ref 8–23)
CO2: 21 mmol/L — ABNORMAL LOW (ref 22–32)
Calcium: 9 mg/dL (ref 8.9–10.3)
Chloride: 110 mmol/L (ref 98–111)
Creatinine, Ser: 1 mg/dL (ref 0.44–1.00)
GFR, Estimated: 54 mL/min — ABNORMAL LOW (ref >60)
Glucose, Bld: 93 mg/dL (ref 70–99)
Potassium: 5 mmol/L (ref 3.5–5.1)
Sodium: 141 mmol/L (ref 135–145)
Total Bilirubin: 1.1 mg/dL (ref 0.0–1.2)
Total Protein: 6.2 g/dL — ABNORMAL LOW (ref 6.5–8.1)

## 2024-03-09 LAB — CBC
HCT: 39.2 % (ref 36.0–46.0)
Hemoglobin: 12.7 g/dL (ref 12.0–15.0)
MCH: 36.5 pg — ABNORMAL HIGH (ref 26.0–34.0)
MCHC: 32.4 g/dL (ref 30.0–36.0)
MCV: 112.6 fL — ABNORMAL HIGH (ref 80.0–100.0)
Platelets: 245 K/uL (ref 150–400)
RBC: 3.48 MIL/uL — ABNORMAL LOW (ref 3.87–5.11)
RDW: 13.2 % (ref 11.5–15.5)
WBC: 9.6 K/uL (ref 4.0–10.5)
nRBC: 0 % (ref 0.0–0.2)

## 2024-03-09 LAB — TROPONIN I (HIGH SENSITIVITY)
Troponin I (High Sensitivity): 6 ng/L (ref <18)
Troponin I (High Sensitivity): 6 ng/L (ref <18)

## 2024-03-09 LAB — LIPASE, BLOOD: Lipase: 54 U/L — ABNORMAL HIGH (ref 11–51)

## 2024-03-09 MED ORDER — IOHEXOL 350 MG/ML SOLN
75.0000 mL | Freq: Once | INTRAVENOUS | Status: AC | PRN
  Administered 2024-03-09: 75 mL via INTRAVENOUS

## 2024-03-09 MED ORDER — IBUPROFEN 400 MG PO TABS
600.0000 mg | ORAL_TABLET | Freq: Once | ORAL | Status: DC
  Filled 2024-03-09: qty 1

## 2024-03-09 MED ORDER — METOPROLOL TARTRATE 25 MG PO TABS
25.0000 mg | ORAL_TABLET | Freq: Once | ORAL | Status: AC
  Administered 2024-03-09: 25 mg via ORAL
  Filled 2024-03-09: qty 1

## 2024-03-09 NOTE — Telephone Encounter (Signed)
 Patient transferred from triage nurse; Patient having active chest pain time 1.5 days. Details in note below.  Patient agreed with EMS being called and waiting in her bedroom. Cntacted patient nephew, Merilee, as requested. He going to meet EMS at patient apartment, called patient back and made her aware.

## 2024-03-09 NOTE — ED Notes (Signed)
 Patient transported to CT

## 2024-03-09 NOTE — Discharge Instructions (Signed)
 You were evaluated here today for chest pain.  Your evaluated with EKG, heart enzymes, and imaging of your chest.  No evidence of heart attack was found.  No other acute abnormalities were seen on your chest CT. You were evaluated for epigastric discomfort with eating.  Your lipase is slightly elevated.  As discussed, please avoid fatty foods and alcohol .  Please follow-up with your GI doctor for recheck regarding this. Please return if you are having any new or worsening symptoms.

## 2024-03-09 NOTE — Telephone Encounter (Signed)
 FYI Only or Action Required?: Action required by provider: update on patient condition.  Patient was last seen in primary care on 02/13/2024 by Merlynn Niki FALCON, FNP.  Called Nurse Triage reporting Chest Pain.  Symptoms began yesterday.  Interventions attempted: Prescription medications: linzess , protonix .  Symptoms are: gradually worsening.  Triage Disposition: Call EMS 911 Now  Patient/caregiver understands and will follow disposition?: No, refuses dispositionCopied from CRM [id number]. Topic: Clinical - Red Word Triage >> Mar 09, 2024  9:04 AM Burnard DEL wrote: Red Word that prompted transfer to Nurse Triage: nauseous,food wont digest,pain on left side of chest under breast ,not able to eat Reason for Disposition  [1] Chest pain lasts > 5 minutes AND [2] age > 81  Answer Assessment - Initial Assessment Questions Left side of chest/under breast. Nauseated/indigestion. Pt has taken Linzess  and helped some. I just don't feel good. I don't know what to tell you. I live alone and get scared a lot. Pt is experiencing SOB- I can't breathe deep. RN advised 911 and pt has refused. Pt wants to see PCP. CAL notified. Spoke with Suzen and transferred call.      1. LOCATION: Where does it hurt?       Left side of chest 2. RADIATION: Does the pain go anywhere else? (e.g., into neck, jaw, arms, back)     denies 3. ONSET: When did the chest pain begin? (Minutes, hours or days)      Yesterday morning 4. PATTERN: Does the pain come and go, or has it been constant since it started?  Does it get worse with exertion?      Constant 5. DURATION: How long does it last (e.g., seconds, minutes, hours)     12+hours 6. SEVERITY: How bad is the pain?  (e.g., Scale 1-10; mild, moderate, or severe)     8 7. CARDIAC RISK FACTORS: Do you have any history of heart problems or risk factors for heart disease? (e.g., angina, prior heart attack; diabetes, high blood pressure, high cholesterol,  smoker, or strong family history of heart disease)     High blood pressure 8. PULMONARY RISK FACTORS: Do you have any history of lung disease?  (e.g., blood clots in lung, asthma, emphysema, birth control pills)     asthma 9. CAUSE: What do you think is causing the chest pain?     indigestion 10. OTHER SYMPTOMS: Do you have any other symptoms? (e.g., dizziness, nausea, vomiting, sweating, fever, difficulty breathing, cough)       Indigestion, nausea, no appetite  Protocols used: Chest Pain-A-AH

## 2024-03-09 NOTE — Telephone Encounter (Signed)
 Agree

## 2024-03-09 NOTE — ED Triage Notes (Signed)
 PT arrived via [facility] from home after onset of L flank pain and headache that began last night around 2000. PT endorses 9/10 pain for HA and 6/10 for RF. Aox4. Also endorses tingling bilateral cheeks. Did not take morning BP meds.  [facility] VS: BP: 206/80, 96% Spo2, 64 HR. 107 CBG

## 2024-03-09 NOTE — ED Provider Notes (Signed)
 [facility] AT [facility] Provider Note   CSN: [id number] Arrival date & time: 03/09/24  1017     Patient presents with: Flank Pain   Tina Riley is a 89 y.o. female.   HPI 89 year old female presents today complaining of left sided chest pain that she points to the left axillary line.  She is unable to tell me anything that increases or decreases it.  Pain has been present for some period of time.  She cannot tell me anything that makes it better or worse. Patient has history of chronic headache and continues to have headache here.  She has had no recent trauma is not on blood thinners.  She denies any lateralized weakness or other lateralized symptoms. Patient is complaining decreased ability to tolerate p.o.  She states that she feels very full in the epigastrium after she eats.  She has had decreased p.o. secondary to this.  She feels that she has been constipated and took linezolid this morning.  She had a bowel movement that was loose around 8 AM.  She denies vomiting, diarrhea. No report of recent fever, chills, UTI symptoms.    Prior to Admission medications   Medication Sig Start Date End Date Taking? Authorizing Provider  Acetaminophen  Extra Strength 500 MG TABS Take 1 tablet (500 mg total) by mouth daily as needed for headache or mild pain. 07/21/22     albuterol  (VENTOLIN  HFA) 108 (90 Base) MCG/ACT inhaler Inhale 2 puffs into the lungs every 6 (six) hours as needed for shortness of breath or wheezing. 02/13/24   Merlynn Niki FALCON, FNP  clonazePAM  (KLONOPIN ) 0.5 MG tablet Take 1 tablet (0.5 mg total) by mouth 2 (two) times daily as needed for anxiety. 01/24/24   [name], MD  dicyclomine  (BENTYL ) 10 MG capsule Take 1 capsule (10 mg total) by mouth 2 (two) times daily as needed for spasms. 03/17/23   [name], MD  erythromycin  ophthalmic ointment Apply a small amount to both eyes at bedtime. 02/22/24      Fluticasone -Umeclidin-Vilant (TRELEGY ELLIPTA ) 100-62.5-25 MCG/ACT AEPB Inhale 1 puff into the lungs daily. 02/13/24   Merlynn Niki FALCON, FNP  gabapentin  (NEURONTIN ) 300 MG capsule Take 1 capsule (300 mg total) by mouth at bedtime. 11/17/23   [name], MD  levocetirizine (XYZAL ) 5 MG tablet Take 1 tablet (5 mg total) by mouth daily. 11/17/23   [name], MD  linaclotide  (LINZESS ) 145 MCG CAPS capsule Take 1 capsule (145 mcg total) by mouth daily before breakfast. 02/27/24   [name], MD  loperamide  (IMODIUM  A-D) 2 MG tablet Take 1 tablet by mouth three times a day as needed for loose stools Patient not taking: Reported on 02/13/2024 07/21/22     melatonin 3 MG TABS tablet Take 1 tablet by mouth every night at bedtime as needed for insomnia Patient not taking: Reported on 02/13/2024 07/21/22     mupirocin  ointment (BACTROBAN ) 2 % Apply 1 Application topically 2 (two) times daily. 12/20/23   [name], MD  ondansetron  (ZOFRAN -ODT) 4 MG disintegrating tablet Dissolve 1 tablet under the tongue every 6 (six) hours as needed for nausea. 02/13/24   Merlynn Niki FALCON, FNP  pantoprazole  (PROTONIX ) 40 MG tablet Take 1 tablet (40 mg total) by mouth 2 (two) times daily. 06/20/23   [name], MD  Polyethyl Glycol-Propyl Glycol (SYSTANE OP) Place 1 drop into both eyes daily as needed (dry eyes).    [provider]  propranolol  ER (INDERAL  LA) 60 MG 24 hr capsule Take 1 capsule (60 mg total) by mouth daily. 01/24/24   [name], MD  psyllium (REGULOID) 0.52 g capsule Take 2 g by mouth at bedtime. 2 gummies (Metamucil) Patient not taking: Reported on 02/13/2024    [provider]  rosuvastatin  (CRESTOR ) 5 MG tablet Take 1 tablet (5 mg total) by mouth at bedtime. 11/17/23   [name], MD  triamcinolone  cream (KENALOG ) 0.1 % Apply 1 Application topically 2 (two) times daily. 10/03/23   [name], MD  valACYclovir  (VALTREX )  1000 MG tablet Take 1 tablet (1,000 mg total) by mouth daily. 01/24/24   [name], MD    Allergies: Cephalexin, Ciprofloxacin, Other, Meperidine hcl, Sulfa antibiotics, Cefdinir , and Doxycycline    Review of Systems  Updated Vital Signs BP (!) 137/102   Pulse 66   Temp 97.9 F (36.6 C) (Oral)   Resp 13   Ht 1.499 m (4' 11)   Wt 48.1 kg   SpO2 100%   BMI 21.41 kg/m   Physical Exam Vitals reviewed.  Constitutional:      Appearance: Normal appearance.  HENT:     Head: Normocephalic.     Right Ear: External ear normal.     Left Ear: External ear normal.     Nose: Nose normal.     Mouth/Throat:     Pharynx: Oropharynx is clear.  Eyes:     Extraocular Movements: Extraocular movements intact.     Pupils: Pupils are equal, round, and reactive to light.  Cardiovascular:     Rate and Rhythm: Normal rate and regular rhythm.     Pulses: Normal pulses.  Pulmonary:     Effort: Pulmonary effort is normal.  Abdominal:     General: Abdomen is flat. There is no distension.     Palpations: Abdomen is soft. There is no mass.     Tenderness: There is no abdominal tenderness.  Musculoskeletal:        General: Normal range of motion.     Cervical back: Normal range of motion.  Skin:    General: Skin is warm and dry.     Capillary Refill: Capillary refill takes less than 2 seconds.  Neurological:     General: No focal deficit present.     Mental Status: She is alert.  Psychiatric:        Mood and Affect: Mood normal.     (all labs ordered are listed, but only abnormal results are displayed) Labs Reviewed  CBC - Abnormal; Notable for the following components:      Result Value   RBC 3.48 (*)    MCV 112.6 (*)    MCH 36.5 (*)    All other components within normal limits  COMPREHENSIVE METABOLIC PANEL WITH GFR - Abnormal; Notable for the following components:   CO2 21 (*)    Total Protein 6.2 (*)    GFR, Estimated 54 (*)    All other components within normal limits   LIPASE, BLOOD - Abnormal; Notable for the following components:   Lipase 54 (*)    All other components within normal limits  TROPONIN I (HIGH SENSITIVITY)  TROPONIN I (HIGH SENSITIVITY)    EKG: EKG Interpretation Date/Time:  Friday March 09 2024 11:23:28 EDT Ventricular Rate:  67 PR Interval:  156 QRS Duration:  94 QT Interval:  411 QTC Calculation: 434 R Axis:   -14  Text Interpretation: Sinus  rhythm Abnormal R-wave progression, early transition Confirmed by Levander Houston [phone]) on 03/09/2024 11:47:57 AM  Radiology: [facility] Chest Port 1 View Result Date: 03/09/2024 CLINICAL DATA:  Chest pain. EXAM: PORTABLE CHEST 1 VIEW COMPARISON:  11/03/2021. FINDINGS: Stable cardiomediastinal contours. Aortic atherosclerosis. No focal consolidation, sizeable pleural effusion, or pneumothorax. Diffuse osseous demineralization. No acute osseous abnormality. IMPRESSION: 1. No acute cardiopulmonary findings. 2.  Aortic Atherosclerosis (ICD10-I70.0). Electronically Signed   By: Harrietta Sherry M.D.   On: 03/09/2024 14:12   CT CHEST ABDOMEN PELVIS W CONTRAST Result Date: 03/09/2024 CLINICAL DATA:  pain chest and upper abdomen. EXAM: CT CHEST, ABDOMEN, AND PELVIS WITH CONTRAST TECHNIQUE: Multidetector CT imaging of the chest, abdomen and pelvis was performed following the standard protocol during bolus administration of intravenous contrast. RADIATION DOSE REDUCTION: This exam was performed according to the departmental dose-optimization program which includes automated exposure control, adjustment of the mA and/or kV according to patient size and/or use of iterative reconstruction technique. CONTRAST:  75mL OMNIPAQUE  IOHEXOL  350 MG/ML SOLN COMPARISON:  CT scan chest, abdomen and pelvis from 11/04/2021. FINDINGS: CT CHEST FINDINGS Cardiovascular: Normal cardiac size. No pericardial effusion. No aortic aneurysm. There are coronary artery calcifications, in keeping with coronary artery disease. There are also  moderate to severe peripheral atherosclerotic vascular calcifications of thoracic aorta and its major branches. Mediastinum/Nodes: Visualized thyroid  gland appears grossly unremarkable. No solid / cystic mediastinal masses. The esophagus is nondistended precluding optimal assessment. No axillary, mediastinal or hilar lymphadenopathy by size criteria. Lungs/Pleura: The central tracheo-bronchial tree is patent. There are dependent changes in bilateral lungs. Minimal bronchiectatic changes also noted throughout bilateral lungs mainly in the lower lobes. No mass or consolidation. No pleural effusion or pneumothorax. No suspicious lung nodules. There are multiple scattered 1-2 mm calcified granulomas throughout bilateral lungs. Musculoskeletal: The visualized soft tissues of the chest wall are grossly unremarkable. No suspicious osseous lesions. There are mild multilevel degenerative changes in the visualized spine. There is diffuse osteopenia of the visualized osseous structures. CT ABDOMEN PELVIS FINDINGS Hepatobiliary: The liver is normal in size. Non-cirrhotic configuration. There are 4-5 simple cysts with largest measuring up to 1.3 x 1.9 cm. There is probable mild diffuse hepatic steatosis. No intrahepatic bile duct dilation. There is mild prominence of the extrahepatic bile duct, most likely due to post cholecystectomy status. Gallbladder is surgically absent. Pancreas: Unremarkable. No pancreatic ductal dilatation or surrounding inflammatory changes. Spleen: Within normal limits. No focal lesion. Adrenals/Urinary Tract: Adrenal glands are unremarkable. No suspicious renal mass. There are several simple cysts in the right kidney with largest in the upper pole measuring up to 1.7 x 1.9 cm. No nephroureterolithiasis or obstructive uropathy on either side. Bilateral extrarenal pelvis noted. Evaluation of urinary bladder is limited due to extensive streak artifacts from left hip arthroplasty. However, in the  visualized portion urinary bladder is within normal limits. Stomach/Bowel: Postsurgical changes from prior right hemicolectomy noted. There is ileocolonic anastomosis in the right upper quadrant. No disproportionate dilation of the small or large bowel loops. No evidence of abnormal bowel wall thickening or inflammatory changes. There are multiple diverticula mainly in the sigmoid colon, without imaging signs of diverticulitis. Vascular/Lymphatic: No ascites or pneumoperitoneum. No abdominal or pelvic lymphadenopathy, by size criteria. No aneurysmal dilation of the major abdominal arteries. There are mild peripheral atherosclerotic vascular calcifications of the aorta and its major branches. Reproductive: The uterus is surgically absent. No large adnexal mass. Other: There is a tiny fat containing umbilical hernia. The soft tissues and abdominal  wall are otherwise unremarkable. Musculoskeletal: No suspicious osseous lesions. There are mild multilevel degenerative changes in the visualized spine. Note is made of left hip arthroplasty. IMPRESSION: 1. No acute inflammatory process identified within the chest, abdomen or pelvis. 2. No lung mass, consolidation, pleural effusion or pneumothorax. No bowel obstruction. 3. Multiple other nonacute observations, as described above. Aortic Atherosclerosis (ICD10-I70.0). Electronically Signed   By: Ree Molt M.D.   On: 03/09/2024 13:55     Procedures   Medications Ordered in the ED  ibuprofen  (ADVIL ) tablet 600 mg (600 mg Oral Patient Refused/Not Given 03/09/24 1430)  metoprolol  tartrate (LOPRESSOR ) tablet 25 mg (25 mg Oral Given 03/09/24 1204)  iohexol  (OMNIPAQUE ) 350 MG/ML injection 75 mL (75 mLs Intravenous Contrast Given 03/09/24 1319)                                    Medical Decision Making Amount and/or Complexity of Data Reviewed Labs: ordered. Radiology: ordered.  Risk Prescription drug management.   89 year old female evaluated here today for  left chest pain.  She was brought to ED secondary to calling her pmd and complaining of cp.  Pain is in the left axillary line and tender with palpation. Also complaining of heacache-patient has chronic headaches and denies that this is different than usual.  She is not on any blood thinners and has no recent trauma or lateralized deficits Patient also complaining of discomfort in the upper abdomen which has been present for some extended period of time.  She is complaining of discomfort with eating and difficulty swallowing. 1 chest pain patient evaluated here with EKG, cardiac enzymes, CT of chest, chest x-Mell Guia, CBC, CMET patient's hemoglobin is stable.  No leukocytosis.  EKG without acutely ischemic changes. Did frontal diagnosis includes but is not limited to coronary ischemia, infection, masses, rib fracture or another trauma Patient without evidence of acute coronary syndrome with normal EKG and troponins Pain is atypical for cardiac type pain. No evidence of fracture, effusion, or other acute trauma or intra pulmonary etiologies with CT without acute abnormalities 2 headache appears to be at patient's baseline she is given ibuprofen  here 3 patient with epigastric discomfort and decreased ability to take p.o. over several months.  She has been seen by GI.  Here today her lipase is slightly elevated.  There is no obvious abnormality on her CT.  However this may represent some low-level pancreatitis.  Patient is advised regarding avoiding alcohol  which she does not drink, and avoiding fatty foods.  She is advised to follow-up with her gastroenterologist.     Final diagnoses:  Chest pain, unspecified type  Elevated lipase    ED Discharge Orders     None          Levander Houston, MD 03/09/24 1459

## 2024-03-11 ENCOUNTER — Other Ambulatory Visit: Payer: Self-pay

## 2024-03-11 ENCOUNTER — Emergency Department (HOSPITAL_COMMUNITY)

## 2024-03-11 ENCOUNTER — Emergency Department (HOSPITAL_COMMUNITY)
Admission: EM | Admit: 2024-03-11 | Discharge: 2024-03-11 | Disposition: A | Discharge status: Home / Self Care | Attending: Emergency Medicine | Admitting: Emergency Medicine

## 2024-03-11 ENCOUNTER — Encounter (HOSPITAL_COMMUNITY): Payer: Self-pay | Admitting: Emergency Medicine

## 2024-03-11 DIAGNOSIS — R519 Headache, unspecified: Secondary | ICD-10-CM | POA: Insufficient documentation

## 2024-03-11 DIAGNOSIS — M542 Cervicalgia: Secondary | ICD-10-CM | POA: Diagnosis not present

## 2024-03-11 DIAGNOSIS — W19XXXA Unspecified fall, initial encounter: Secondary | ICD-10-CM | POA: Insufficient documentation

## 2024-03-11 NOTE — ED Notes (Signed)
 Pt discharged. Pt given discharge papers and papers explained. Pt in NAD at this time

## 2024-03-11 NOTE — ED Triage Notes (Signed)
 Pt had a fall tonight and last night. Pt has head and neck pain

## 2024-03-11 NOTE — ED Notes (Signed)
 Assisted pt with bedpan

## 2024-03-11 NOTE — ED Provider Notes (Signed)
 [place] EMERGENCY DEPARTMENT AT [facility] Provider Note   CSN: [id number] Arrival date & time: 03/11/24  9896     Patient presents with: Tina   Irvin Riley is a 89 y.o. female.   The history is provided by the patient.  Fall This is a new problem. The current episode started less than 1 hour ago. The problem occurs constantly. The problem has been resolved. Pertinent negatives include no chest pain, no abdominal pain and no shortness of breath. Nothing aggravates the symptoms. Nothing relieves the symptoms. She has tried nothing for the symptoms. The treatment provided no relief.       Prior to Admission medications   Medication Sig Start Date End Date Taking? Authorizing Provider  Acetaminophen  Extra Strength 500 MG TABS Take 1 tablet (500 mg total) by mouth daily as needed for headache or mild pain. 07/21/22     albuterol  (VENTOLIN  HFA) 108 (90 Base) MCG/ACT inhaler Inhale 2 puffs into the lungs every 6 (six) hours as needed for shortness of breath or wheezing. 02/13/24   Merlynn Niki FALCON, FNP  clonazePAM  (KLONOPIN ) 0.5 MG tablet Take 1 tablet (0.5 mg total) by mouth 2 (two) times daily as needed for anxiety. 01/24/24   [name], MD  dicyclomine  (BENTYL ) 10 MG capsule Take 1 capsule (10 mg total) by mouth 2 (two) times daily as needed for spasms. 03/17/23   [name], MD  erythromycin  ophthalmic ointment Apply a small amount to both eyes at bedtime. 02/22/24     Fluticasone -Umeclidin-Vilant (TRELEGY ELLIPTA ) 100-62.5-25 MCG/ACT AEPB Inhale 1 puff into the lungs daily. 02/13/24   Merlynn Niki FALCON, FNP  gabapentin  (NEURONTIN ) 300 MG capsule Take 1 capsule (300 mg total) by mouth at bedtime. 11/17/23   [name], MD  levocetirizine (XYZAL ) 5 MG tablet Take 1 tablet (5 mg total) by mouth daily. 11/17/23   [name], MD  linaclotide  (LINZESS ) 145 MCG CAPS capsule Take 1 capsule (145 mcg total) by mouth daily before breakfast.  02/27/24   [name], MD  loperamide  (IMODIUM  A-D) 2 MG tablet Take 1 tablet by mouth three times a day as needed for loose stools Patient not taking: Reported on 02/13/2024 07/21/22     melatonin 3 MG TABS tablet Take 1 tablet by mouth every night at bedtime as needed for insomnia Patient not taking: Reported on 02/13/2024 07/21/22     mupirocin  ointment (BACTROBAN ) 2 % Apply 1 Application topically 2 (two) times daily. 12/20/23   [name], MD  ondansetron  (ZOFRAN -ODT) 4 MG disintegrating tablet Dissolve 1 tablet under the tongue every 6 (six) hours as needed for nausea. 02/13/24   Merlynn Niki FALCON, FNP  pantoprazole  (PROTONIX ) 40 MG tablet Take 1 tablet (40 mg total) by mouth 2 (two) times daily. 06/20/23   [name], MD  Polyethyl Glycol-Propyl Glycol (SYSTANE OP) Place 1 drop into both eyes daily as needed (dry eyes).    [provider]  propranolol  ER (INDERAL  LA) 60 MG 24 hr capsule Take 1 capsule (60 mg total) by mouth daily. 01/24/24   [name], MD  psyllium (REGULOID) 0.52 g capsule Take 2 g by mouth at bedtime. 2 gummies (Metamucil) Patient not taking: Reported on 02/13/2024    [provider]  rosuvastatin  (CRESTOR ) 5 MG tablet Take 1 tablet (5 mg total) by mouth at bedtime. 11/17/23   [name], MD  triamcinolone  cream (KENALOG ) 0.1 % Apply 1 Application topically  2 (two) times daily. 10/03/23   [name], MD  valACYclovir  (VALTREX ) 1000 MG tablet Take 1 tablet (1,000 mg total) by mouth daily. 01/24/24   [name], MD    Allergies: Cephalexin, Ciprofloxacin, Other, Meperidine hcl, Sulfa antibiotics, Cefdinir , and Doxycycline    Review of Systems  Constitutional:  Negative for fever.  Respiratory:  Negative for shortness of breath.   Cardiovascular:  Negative for chest pain.  Gastrointestinal:  Negative for abdominal pain.  All other systems reviewed and are negative.   Updated Vital  Signs There were no vitals taken for this visit.  Physical Exam Vitals and nursing note reviewed.  Constitutional:      General: She is not in acute distress.    Appearance: Normal appearance. She is well-developed.  HENT:     Head: Normocephalic and atraumatic.     Nose: Nose normal.  Eyes:     Pupils: Pupils are equal, round, and reactive to light.  Cardiovascular:     Rate and Rhythm: Normal rate and regular rhythm.     Pulses: Normal pulses.     Heart sounds: Normal heart sounds.  Pulmonary:     Effort: Pulmonary effort is normal. No respiratory distress.     Breath sounds: Normal breath sounds.  Abdominal:     General: Bowel sounds are normal. There is no distension.     Palpations: Abdomen is soft.     Tenderness: There is no abdominal tenderness. There is no guarding or rebound.  Musculoskeletal:        General: Normal range of motion.     Cervical back: Normal range of motion and neck supple. No tenderness.  Skin:    General: Skin is warm and dry.     Capillary Refill: Capillary refill takes less than 2 seconds.     Findings: No erythema or rash.  Neurological:     General: No focal deficit present.     Mental Status: She is alert and oriented to person, place, and time.     Deep Tendon Reflexes: Reflexes normal.  Psychiatric:        Mood and Affect: Mood normal.     (all labs ordered are listed, but only abnormal results are displayed) Labs Reviewed - No data to display  EKG: None  Radiology: CT Cervical Spine Wo Contrast Result Date: 03/11/2024 CLINICAL DATA:  Status post trauma. EXAM: CT CERVICAL SPINE WITHOUT CONTRAST TECHNIQUE: Multidetector CT imaging of the cervical spine was performed without intravenous contrast. Multiplanar CT image reconstructions were also generated. RADIATION DOSE REDUCTION: This exam was performed according to the departmental dose-optimization program which includes automated exposure control, adjustment of the mA and/or kV  according to patient size and/or use of iterative reconstruction technique. COMPARISON:  Caillou Minus 19, 2023 FINDINGS: Alignment: There is exaggeration of the normal cervical spine lordosis. Skull base and vertebrae: No acute fracture. Numerous predominant subcentimeter lucencies are seen scattered throughout the vertebral bodies of the cervical spine. These are seen on the prior study and predominantly stable in size and appearance. Soft tissues and spinal canal: No prevertebral fluid or swelling. No visible canal hematoma. Disc levels: Mild multilevel endplate sclerosis is seen throughout the cervical spine. Mild anterior osteophyte formation is also seen at the level of C5-C6. There is marked severity narrowing of the anterior atlantoaxial articulation. Mild multilevel intervertebral disc space narrowing is present. Bilateral moderate to marked severity multilevel facet joint hypertrophy is noted. Upper chest: Negative. Other: None. IMPRESSION: 1. No  acute fracture or subluxation of the cervical spine. 2. Multilevel degenerative changes, as described above. 3. Numerous predominant subcentimeter lucencies scattered throughout the vertebral bodies of the cervical spine. These are seen on the prior study and predominantly stable in size and appearance. While these findings may be secondary to diffuse osteopenia, multiple myeloma or metastatic disease cannot be excluded. Electronically Signed   By: Suzen Dials M.D.   On: 03/11/2024 02:12   CT Head Wo Contrast Result Date: 03/11/2024 CLINICAL DATA:  Status post fall. EXAM: CT HEAD WITHOUT CONTRAST TECHNIQUE: Contiguous axial images were obtained from the base of the skull through the vertex without intravenous contrast. RADIATION DOSE REDUCTION: This exam was performed according to the departmental dose-optimization program which includes automated exposure control, adjustment of the mA and/or kV according to patient size and/or use of iterative reconstruction  technique. COMPARISON:  June 26, 2022 FINDINGS: Brain: There is generalized cerebral atrophy with widening of the extra-axial spaces and ventricular dilatation. There are areas of decreased attenuation within the white matter tracts of the supratentorial brain, consistent with microvascular disease changes. Vascular: No hyperdense vessel or unexpected calcification. Skull: Normal. Negative for fracture or focal lesion. Sinuses/Orbits: No acute finding. Other: None. IMPRESSION: 1. No acute intracranial abnormality. 2. Generalized cerebral atrophy and microvascular disease changes of the supratentorial brain. Electronically Signed   By: Suzen Dials M.D.   On: 03/11/2024 02:08   DG Chest Port 1 View Result Date: 03/09/2024 CLINICAL DATA:  Chest pain. EXAM: PORTABLE CHEST 1 VIEW COMPARISON:  11/03/2021. FINDINGS: Stable cardiomediastinal contours. Aortic atherosclerosis. No focal consolidation, sizeable pleural effusion, or pneumothorax. Diffuse osseous demineralization. No acute osseous abnormality. IMPRESSION: 1. No acute cardiopulmonary findings. 2.  Aortic Atherosclerosis (ICD10-I70.0). Electronically Signed   By: Harrietta Sherry M.D.   On: 03/09/2024 14:12   CT CHEST ABDOMEN PELVIS W CONTRAST Result Date: 03/09/2024 CLINICAL DATA:  pain chest and upper abdomen. EXAM: CT CHEST, ABDOMEN, AND PELVIS WITH CONTRAST TECHNIQUE: Multidetector CT imaging of the chest, abdomen and pelvis was performed following the standard protocol during bolus administration of intravenous contrast. RADIATION DOSE REDUCTION: This exam was performed according to the departmental dose-optimization program which includes automated exposure control, adjustment of the mA and/or kV according to patient size and/or use of iterative reconstruction technique. CONTRAST:  75mL OMNIPAQUE  IOHEXOL  350 MG/ML SOLN COMPARISON:  CT scan chest, abdomen and pelvis from 11/04/2021. FINDINGS: CT CHEST FINDINGS Cardiovascular: Normal cardiac  size. No pericardial effusion. No aortic aneurysm. There are coronary artery calcifications, in keeping with coronary artery disease. There are also moderate to severe peripheral atherosclerotic vascular calcifications of thoracic aorta and its major branches. Mediastinum/Nodes: Visualized thyroid  gland appears grossly unremarkable. No solid / cystic mediastinal masses. The esophagus is nondistended precluding optimal assessment. No axillary, mediastinal or hilar lymphadenopathy by size criteria. Lungs/Pleura: The central tracheo-bronchial tree is patent. There are dependent changes in bilateral lungs. Minimal bronchiectatic changes also noted throughout bilateral lungs mainly in the lower lobes. No mass or consolidation. No pleural effusion or pneumothorax. No suspicious lung nodules. There are multiple scattered 1-2 mm calcified granulomas throughout bilateral lungs. Musculoskeletal: The visualized soft tissues of the chest wall are grossly unremarkable. No suspicious osseous lesions. There are mild multilevel degenerative changes in the visualized spine. There is diffuse osteopenia of the visualized osseous structures. CT ABDOMEN PELVIS FINDINGS Hepatobiliary: The liver is normal in size. Non-cirrhotic configuration. There are 4-5 simple cysts with largest measuring up to 1.3 x 1.9 cm. There is probable mild  diffuse hepatic steatosis. No intrahepatic bile duct dilation. There is mild prominence of the extrahepatic bile duct, most likely due to post cholecystectomy status. Gallbladder is surgically absent. Pancreas: Unremarkable. No pancreatic ductal dilatation or surrounding inflammatory changes. Spleen: Within normal limits. No focal lesion. Adrenals/Urinary Tract: Adrenal glands are unremarkable. No suspicious renal mass. There are several simple cysts in the right kidney with largest in the upper pole measuring up to 1.7 x 1.9 cm. No nephroureterolithiasis or obstructive uropathy on either side. Bilateral  extrarenal pelvis noted. Evaluation of urinary bladder is limited due to extensive streak artifacts from left hip arthroplasty. However, in the visualized portion urinary bladder is within normal limits. Stomach/Bowel: Postsurgical changes from prior right hemicolectomy noted. There is ileocolonic anastomosis in the right upper quadrant. No disproportionate dilation of the small or large bowel loops. No evidence of abnormal bowel wall thickening or inflammatory changes. There are multiple diverticula mainly in the sigmoid colon, without imaging signs of diverticulitis. Vascular/Lymphatic: No ascites or pneumoperitoneum. No abdominal or pelvic lymphadenopathy, by size criteria. No aneurysmal dilation of the major abdominal arteries. There are mild peripheral atherosclerotic vascular calcifications of the aorta and its major branches. Reproductive: The uterus is surgically absent. No large adnexal mass. Other: There is a tiny fat containing umbilical hernia. The soft tissues and abdominal wall are otherwise unremarkable. Musculoskeletal: No suspicious osseous lesions. There are mild multilevel degenerative changes in the visualized spine. Note is made of left hip arthroplasty. IMPRESSION: 1. No acute inflammatory process identified within the chest, abdomen or pelvis. 2. No lung mass, consolidation, pleural effusion or pneumothorax. No bowel obstruction. 3. Multiple other nonacute observations, as described above. Aortic Atherosclerosis (ICD10-I70.0). Electronically Signed   By: Ree Molt M.D.   On: 03/09/2024 13:55     Procedures   Medications Ordered in the ED - No data to display                                  Medical Decision Making Patient has a fall at home striking her head   Amount and/or Complexity of Data Reviewed External Data Reviewed: notes.    Details: Previous notes and imaging reviewed  Radiology: ordered and independent interpretation performed.    Details: Negative head CT    Risk Risk Details: Fall and struck head.  No injuries on CT stable for discharge.       Final diagnoses:  None   No signs of systemic illness or infection. The patient is nontoxic-appearing on exam and vital signs are within normal limits.  I have reviewed the triage vital signs and the nursing notes. Pertinent labs & imaging results that were available during my care of the patient were reviewed by me and considered in my medical decision making (see chart for details). After history, exam, and medical workup I feel the patient has been appropriately medically screened and is safe for discharge home. Pertinent diagnoses were discussed with the patient. Patient was given return precautions.  ED Discharge Orders     None          Florence Yeung, MD 03/11/24 [id number]

## 2024-03-12 ENCOUNTER — Other Ambulatory Visit: Payer: Self-pay | Admitting: Internal Medicine

## 2024-03-12 ENCOUNTER — Other Ambulatory Visit (HOSPITAL_BASED_OUTPATIENT_CLINIC_OR_DEPARTMENT_OTHER): Payer: Self-pay

## 2024-03-12 MED ORDER — TRIAMCINOLONE ACETONIDE 0.1 % EX CREA
1.0000 | TOPICAL_CREAM | Freq: Two times a day (BID) | CUTANEOUS | 0 refills | Status: AC
  Filled 2024-03-12: qty 80, 40d supply, fill #0

## 2024-03-20 ENCOUNTER — Other Ambulatory Visit (HOSPITAL_BASED_OUTPATIENT_CLINIC_OR_DEPARTMENT_OTHER): Payer: Self-pay

## 2024-03-22 ENCOUNTER — Other Ambulatory Visit: Payer: Self-pay

## 2024-04-16 ENCOUNTER — Ambulatory Visit: Admitting: Internal Medicine

## 2024-04-16 ENCOUNTER — Encounter: Payer: Self-pay | Admitting: Internal Medicine

## 2024-04-16 VITALS — BP 122/80 | HR 73 | Temp 97.9°F | Ht 59.0 in | Wt 108.0 lb

## 2024-04-16 DIAGNOSIS — Z23 Encounter for immunization: Secondary | ICD-10-CM | POA: Diagnosis not present

## 2024-04-16 DIAGNOSIS — K5900 Constipation, unspecified: Secondary | ICD-10-CM

## 2024-04-16 DIAGNOSIS — R519 Headache, unspecified: Secondary | ICD-10-CM

## 2024-04-16 DIAGNOSIS — H538 Other visual disturbances: Secondary | ICD-10-CM

## 2024-04-16 DIAGNOSIS — M79672 Pain in left foot: Secondary | ICD-10-CM | POA: Diagnosis not present
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
## 2024-04-16 NOTE — Patient Instructions (Addendum)
 We will get you in with the foot doctor.

## 2024-04-18 ENCOUNTER — Other Ambulatory Visit (HOSPITAL_BASED_OUTPATIENT_CLINIC_OR_DEPARTMENT_OTHER): Payer: Self-pay

## 2024-04-18 MED ORDER — VALACYCLOVIR HCL 500 MG PO TABS
1000.0000 mg | ORAL_TABLET | Freq: Every day | ORAL | 2 refills | Status: AC
  Filled 2024-04-18: qty 180, 90d supply, fill #0
  Filled 2024-07-29: qty 180, 90d supply, fill #1

## 2024-04-19 DIAGNOSIS — M79672 Pain in left foot: Secondary | ICD-10-CM | POA: Insufficient documentation

## 2024-04-19 NOTE — Assessment & Plan Note (Signed)
 Overall stable and triggered by dietary trigger. We discussed continuing linzess .

## 2024-04-19 NOTE — Assessment & Plan Note (Signed)
 A painful corn or callus on her left foot likely results from a previous pedicure injury, causing discomfort and limiting footwear choices. Foot blood flow and sensation are good. The differential includes pressure or friction-induced corn or callus. Refer to a podiatrist for evaluation and treatment.

## 2024-04-25 ENCOUNTER — Ambulatory Visit (INDEPENDENT_AMBULATORY_CARE_PROVIDER_SITE_OTHER)

## 2024-04-25 ENCOUNTER — Ambulatory Visit: Admitting: Podiatry

## 2024-04-25 ENCOUNTER — Encounter: Payer: Self-pay | Admitting: Podiatry

## 2024-04-25 DIAGNOSIS — M779 Enthesopathy, unspecified: Secondary | ICD-10-CM

## 2024-04-25 DIAGNOSIS — D2372 Other benign neoplasm of skin of left lower limb, including hip: Secondary | ICD-10-CM

## 2024-04-25 DIAGNOSIS — M7752 Other enthesopathy of left foot: Secondary | ICD-10-CM

## 2024-04-25 NOTE — Progress Notes (Signed)
  Subjective:  Patient ID: Tina Riley, female    DOB: 03/13/1932,   MRN: [id number]  Chief Complaint  Patient presents with   Foot Pain    My foot hurts on the side from my little toe up to my ankle on my left foot.    89 y.o. female presents for concern of left foot pain that has been ongoing for a while. She relate pain on the outside of her foot. Does have a lesion that is the center of the tenderness. He has been trying foot soaks and not helping much.   . Denies any other pedal complaints. Denies n/v/f/c.   Past Medical History:  Diagnosis Date   Arthritis    Asthma    Chicken pox    Depression    Diverticulitis    Genital warts    GERD (gastroesophageal reflux disease)    Hay fever    History of frequent urinary tract infections    Hypertension    IBS (irritable bowel syndrome)    Lactose intolerance    Peripheral neuropathy    Urinary incontinence     Objective:  Physical Exam: Vascular: DP/PT pulses 2/4 bilateral. CFT <3 seconds. Normal hair growth on digits. No edema.  Skin. No lacerations or abrasions bilateral feet. Hyperkeratotic lesion noted to lateral fifth metatasal base on left with disruption of skin lines.  Musculoskeletal: MMT 5/5 bilateral lower extremities in DF, PF, Inversion and Eversion. Deceased ROM in DF of ankle joint.  Neurological: Sensation intact to light touch.   Assessment:   1. Benign neoplasm of skin of foot, left   2. Tendonitis      Plan:  Patient was evaluated and treated and all questions answered. X-rays reviewed and discussed with patient. No acute fractures or dislocations noted.  -Discussed benign skin lesions with patient and treatment options.  -Hyperkeratotic tissue was debrided with chisel without incident.  -Applied salycylic acid treatment to area with dressing. Advised to remove bandaging tomorrow.  -Encouraged daily moisturizing -Discussed use of pumice stone -Advised good supportive shoes and  inserts -Patient to return to office as needed or sooner if condition worsens.    Asberry Failing, DPM

## 2024-05-01 ENCOUNTER — Other Ambulatory Visit: Payer: Self-pay

## 2024-05-03 ENCOUNTER — Telehealth: Payer: Self-pay | Admitting: Lab

## 2024-05-03 NOTE — Telephone Encounter (Signed)
 Patient is calling states had bone removed and is having pain in surgical site still and would like advise on what to do.

## 2024-05-15 ENCOUNTER — Ambulatory Visit: Payer: Medicare Other

## 2024-05-15 VITALS — Ht 59.0 in | Wt 108.0 lb

## 2024-05-15 DIAGNOSIS — Z0000 Encounter for general adult medical examination without abnormal findings: Secondary | ICD-10-CM | POA: Diagnosis not present

## 2024-05-15 NOTE — Progress Notes (Addendum)
 Subjective:   Tina Riley is a 89 y.o. who presents for a Medicare Wellness preventive visit.  As a reminder, Annual Wellness Visits don't include a physical exam, and some assessments may be limited, especially if this visit is performed virtually. We may recommend an in-person follow-up visit with your provider if needed.  Visit Complete: Virtual I connected with  Romero Mort Kusch on 05/15/24 by a audio enabled telemedicine application and verified that I am speaking with the correct person using two identifiers.  Patient Location: Home  Provider Location: Office/Clinic  I discussed the limitations of evaluation and management by telemedicine. The patient expressed understanding and agreed to proceed.  Vital Signs: Because this visit was a virtual/telehealth visit, some criteria may be missing or patient reported. Any vitals not documented were not able to be obtained and vitals that have been documented are patient reported.  VideoDeclined- This patient declined Librarian, academic. Therefore the visit was completed with audio only.  Persons Participating in Visit: Patient.  AWV Questionnaire: No: Patient Medicare AWV questionnaire was not completed prior to this visit.  Cardiac Risk Factors include: advanced age (>22men, >45 women);dyslipidemia     Objective:    Today's Vitals   05/15/24 1314  Weight: 108 lb (49 kg)  Height: 4' 11 (1.499 m)   Body mass index is 21.81 kg/m.     05/15/2024    1:15 PM 03/11/2024    1:07 AM 05/12/2023    1:55 PM 12/22/2022    2:06 PM 06/25/2022    9:33 PM 05/14/2022    1:48 PM 11/03/2021   11:12 PM  Advanced Directives  Does Patient Have a Medical Advance Directive? Yes No Yes Yes No Yes No  Type of Estate Agent of [place];Living will  Healthcare Power of [place];Living will   Healthcare Power of [place];Living will   Does patient want to make changes to medical advance  directive? No - Patient declined  No - Patient declined      Copy of Healthcare Power of Attorney in Chart? Yes - validated most recent copy scanned in chart (See row information)  Yes - validated most recent copy scanned in chart (See row information)   No - copy requested   Would patient like information on creating a medical advance directive?       Yes (ED - Information included in AVS)    Current Medications (verified) Outpatient Encounter Medications as of 05/15/2024  Medication Sig   Acetaminophen  Extra Strength 500 MG TABS Take 1 tablet (500 mg total) by mouth daily as needed for headache or mild pain.   albuterol  (VENTOLIN  HFA) 108 (90 Base) MCG/ACT inhaler Inhale 2 puffs into the lungs every 6 (six) hours as needed for shortness of breath or wheezing.   clonazePAM  (KLONOPIN ) 0.5 MG tablet Take 1 tablet (0.5 mg total) by mouth 2 (two) times daily as needed for anxiety.   dicyclomine  (BENTYL ) 10 MG capsule Take 1 capsule (10 mg total) by mouth 2 (two) times daily as needed for spasms.   erythromycin  ophthalmic ointment Apply a small amount to both eyes at bedtime.   Fluticasone -Umeclidin-Vilant (TRELEGY ELLIPTA ) 100-62.5-25 MCG/ACT AEPB Inhale 1 puff into the lungs daily.   gabapentin  (NEURONTIN ) 300 MG capsule Take 1 capsule (300 mg total) by mouth at bedtime.   levocetirizine (XYZAL ) 5 MG tablet Take 1 tablet (5 mg total) by mouth daily.   linaclotide  (LINZESS ) 145 MCG CAPS capsule Take 1 capsule (145 mcg  total) by mouth daily before breakfast.   mupirocin  ointment (BACTROBAN ) 2 % Apply 1 Application topically 2 (two) times daily.   ondansetron  (ZOFRAN -ODT) 4 MG disintegrating tablet Dissolve 1 tablet under the tongue every 6 (six) hours as needed for nausea.   pantoprazole  (PROTONIX ) 40 MG tablet Take 1 tablet (40 mg total) by mouth 2 (two) times daily.   Polyethyl Glycol-Propyl Glycol (SYSTANE OP) Place 1 drop into both eyes daily as needed (dry eyes).   propranolol  ER (INDERAL  LA)  60 MG 24 hr capsule Take 1 capsule (60 mg total) by mouth daily.   rosuvastatin  (CRESTOR ) 5 MG tablet Take 1 tablet (5 mg total) by mouth at bedtime.   triamcinolone  cream (KENALOG ) 0.1 % Apply 1 Application topically 2 (two) times daily.   valACYclovir  (VALTREX ) 500 MG tablet Take 2 tablets (1,000 mg total) by mouth daily.   loperamide  (IMODIUM  A-D) 2 MG tablet Take 1 tablet by mouth three times a day as needed for loose stools (Patient not taking: Reported on 05/15/2024)   melatonin 3 MG TABS tablet Take 1 tablet by mouth every night at bedtime as needed for insomnia (Patient not taking: Reported on 05/15/2024)   psyllium (REGULOID) 0.52 g capsule Take 2 g by mouth at bedtime. 2 gummies (Metamucil) (Patient not taking: Reported on 05/15/2024)   No facility-administered encounter medications on file as of 05/15/2024.    Allergies (verified) Cephalexin, Ciprofloxacin, Other, Meperidine hcl, Sulfa antibiotics, Cefdinir , and Doxycycline   History: Past Medical History:  Diagnosis Date   Arthritis    Asthma    Chicken pox    Depression    Diverticulitis    Genital warts    GERD (gastroesophageal reflux disease)    Hay fever    History of frequent urinary tract infections    Hypertension    IBS (irritable bowel syndrome)    Lactose intolerance    Peripheral neuropathy    Urinary incontinence    Past Surgical History:  Procedure Laterality Date   ABDOMINAL HYSTERECTOMY     Hip Replacement  2020   PARTIAL COLECTOMY  1980s   TONSILLECTOMY AND ADENOIDECTOMY  childhood   vocal cord tumor removal  2012   Family History  Problem Relation Age of Onset   Arthritis Mother    COPD Mother    Heart attack Father    Heart disease Father    Hypertension Father    Hyperlipidemia Father    Stroke Father    Stroke Sister    Hypertension Sister    Hyperlipidemia Sister    Hearing loss Sister    Cancer Sister    Arthritis Sister    Alcohol  abuse Son    Diabetes Son    Drug abuse Son     Heart attack Son    Hypertension Son    Social History   Socioeconomic History   Marital status: Widowed    Spouse name: Not on file   Number of children: Not on file   Years of education: Not on file   Highest education level: Not on file  Occupational History   Occupation: Retired  Tobacco Use   Smoking status: Never   Smokeless tobacco: Never  Vaping Use   Vaping status: Never Used  Substance and Sexual Activity   Alcohol  use: Never   Drug use: Never   Sexual activity: Not Currently  Other Topics Concern   Not on file  Social History Narrative   She is widowed and reports having 1  son   Her nephew Thomasina helps   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   Never smoker no alcohol  1 caffeinated beverage daily no drug use   She lives independently in [employer] [address] apartment   Social Drivers of [employer]: Low Risk  (05/15/2024)   Overall Financial Resource Strain (CARDIA)    Difficulty of Paying Living Expenses: Not hard at all  Food Insecurity: No Food Insecurity (05/15/2024)   Hunger Vital Sign    Worried About Running Out of Food in the Last Year: Never true    Ran Out of Food in the Last Year: Never true  Transportation Needs: No Transportation Needs (05/15/2024)   PRAPARE - Administrator, Civil Service (Medical): No    Lack of Transportation (Non-Medical): No  Physical Activity: Inactive (05/15/2024)   Exercise Vital Sign    Days of Exercise per Week: 0 days    Minutes of Exercise per Session: 0 min  Stress: No Stress Concern Present (05/15/2024)   [employer] of Occupational Health - Occupational Stress Questionnaire    Feeling of Stress: Only a little  Social Connections: Socially Isolated (05/15/2024)   Social Connection and Isolation Panel    Frequency of Communication with Friends and Family: More than three times a week    Frequency of Social Gatherings with Friends and Family: Never     Attends Religious Services: Never    Database Administrator or Organizations: No    Attends Banker Meetings: Never    Marital Status: Widowed    Tobacco Counseling Counseling given: Not Answered    Clinical Intake:  Pre-visit preparation completed: Yes  Pain : No/denies pain     BMI - recorded: 21.81 Nutritional Status: BMI of 19-24  Normal Nutritional Risks: None Diabetes: No  No results found for: HGBA1C   How often do you need to have someone help you when you read instructions, pamphlets, or other written materials from your doctor or pharmacy?: 1 - Never  Interpreter Needed?: No  Information entered by :: Tina Riley, Tina Riley   Activities of Daily Living     05/15/2024    1:19 PM  In your present state of health, do you have any difficulty performing the following activities:  Hearing? 0  Vision? 1  Difficulty concentrating or making decisions? 0  Walking or climbing stairs? 1  Comment uses a walker  Dressing or bathing? 1  Comment Caregiver helps  Doing errands, shopping? 1  Comment Nephew helps  Preparing Food and eating ? N  Using the Toilet? N  In the past six months, have you accidently leaked urine? Y  Comment wears a depend  Do you have problems with loss of bowel control? N  Managing your Medications? N  Managing your Finances? Y  Comment Nephew helps  Housekeeping or managing your Housekeeping? Y  Comment Caregiver helps    Patient Care Team: [name], MD as PCP - General (Internal Medicine) Robinson Mayo, OD as Referring Physician (Optometry)  I have updated your Care Teams any recent Medical Services you may have received from other providers in the past year.     Assessment:   This is a routine wellness examination for Tina Riley.  Hearing/Vision screen Hearing Screening - Comments:: Denies hearing difficulties   Vision Screening - Comments:: Wears rx glasses - up to date with routine eye exams with Mayo Robinson   Goals Addressed  This Visit's Progress     Patient Stated (pt-stated)        Patient stated she plans to keep living       Depression Screen     05/15/2024    1:22 PM 04/16/2024    3:19 PM 04/16/2024    3:18 PM 02/13/2024    2:07 PM 10/03/2023    3:29 PM 05/12/2023    2:17 PM 03/07/2023    3:04 PM  PHQ 2/9 Scores  PHQ - 2 Score 3 0 0 0 0 0 0  PHQ- 9 Score 6 12   0 0 0    Fall Risk     05/15/2024    1:21 PM 04/16/2024    3:17 PM 02/13/2024    2:07 PM 10/03/2023    3:28 PM 06/07/2023    2:07 PM  Fall Risk   Falls in the past year? 1 0 0 0 0  Number falls in past yr: 1 0 0 0 0  Comment 2      Injury with Fall? 0 0 0 0 0  Risk for fall due to : No Fall Risks;Impaired balance/gait;Mental status change  No Fall Risks    Follow up Falls evaluation completed;Falls prevention discussed Falls evaluation completed Falls evaluation completed Falls evaluation completed Falls evaluation completed    MEDICARE RISK AT HOME:  Medicare Risk at Home Any stairs in or around the home?: No If so, are there any without handrails?: No Home free of loose throw rugs in walkways, pet beds, electrical cords, etc?: Yes Adequate lighting in your home to reduce risk of falls?: Yes Life alert?: Yes (necklace) Use of a cane, walker or w/c?: Yes (walker) Grab bars in the bathroom?: Yes Shower chair or bench in shower?: Yes Elevated toilet seat or a handicapped toilet?: Yes (handicapped toilet)  TIMED UP AND GO:  Was the test performed?  No  Cognitive Function: 6CIT completed        05/15/2024    1:25 PM 05/12/2023    2:16 PM 05/14/2022    2:00 PM  6CIT Screen  What Year? 0 points 0 points 0 points  What month? 0 points 0 points 0 points  What time? 0 points 0 points 0 points  Count back from 20 0 points 0 points 2 points  Months in reverse 0 points 2 points 4 points  Repeat phrase 0 points 2 points 4 points  Total Score 0 points 4 points 10 points     Immunizations Immunization History  Administered Date(s) Administered   Fluad Quad(high Dose 65+) 04/09/2020, 05/04/2021, 05/14/2022   Fluad Trivalent(High Dose 65+) 06/07/2023   INFLUENZA, HIGH DOSE SEASONAL PF 05/12/2017, 04/18/2018, 04/03/2019, 04/09/2020, 04/16/2024   Influenza Split 05/13/2011, 04/15/2012   Influenza, Seasonal, Injecte, Preservative Fre 04/18/2014, 05/07/2015   Influenza-Unspecified 05/03/2018   PFIZER(Purple Top)SARS-COV-2 Vaccination 09/13/2019, 10/04/2019, 06/11/2020   Pfizer Covid-19 Vaccine Bivalent Booster 56yrs & up 07/08/2021   Pneumococcal Conjugate-13 12/30/2016   Pneumococcal Polysaccharide-23 07/19/2009   Tdap 10/10/2018    Screening Tests Health Maintenance  Topic Date Due   COVID-19 Vaccine (5 - 2025-26 season) 03/19/2024   Zoster Vaccines- Shingrix (1 of 2) 08/15/2024 (Originally 08/02/1950)   Mammogram  05/15/2025 (Originally 03/23/2018)   DEXA SCAN  05/15/2025 (Originally 08/02/1998)   Medicare Annual Wellness (AWV)  05/15/2025   DTaP/Tdap/Td (2 - Td or Tdap) 10/09/2028   Pneumococcal Vaccine: 50+ Years  Completed   Influenza Vaccine  Completed   Meningococcal B Vaccine  Aged  Out    Health Maintenance Items Addressed:  I have recommended that this patient have a DEXA Scan, Shingles, and Mammogram but she declines at this time. I have discussed the risks and benefits of this procedure with her. The patient verbalizes understanding.   Additional Screening:  Vision Screening: Recommended annual ophthalmology exams for early detection of glaucoma and other disorders of the eye. Is the patient up to date with their annual eye exam?  Yes  Who is the provider or what is the name of the office in which the patient attends annual eye exams? [facility]  Dental Screening: Recommended annual dental exams for proper oral hygiene  Community Resource Referral / Chronic Care Management: CRR required this visit?  No   CCM required this visit?   No   Plan:    I have personally reviewed and noted the following in the patient's chart:   Medical and social history Use of alcohol , tobacco or illicit drugs  Current medications and supplements including opioid prescriptions. Patient is not currently taking opioid prescriptions. Functional ability and status Nutritional status Physical activity Advanced directives List of other physicians Hospitalizations, surgeries, and ER visits in previous 12 months Vitals Screenings to include cognitive, depression, and falls Referrals and appointments  In addition, I have reviewed and discussed with patient certain preventive protocols, quality metrics, and best practice recommendations. A written personalized care plan for preventive services as well as general preventive health recommendations were provided to patient.   Tina CHRISTELLA Riley, Tina Riley   05/15/2024   After Visit Summary: (MyChart) Due to this being a telephonic visit, the after visit summary with patients personalized plan was offered to patient via MyChart   Notes:  Scheduled a 6-mth f/u in 09/2024 w/PCP

## 2024-05-15 NOTE — Patient Instructions (Addendum)
 Ms. Tillis,  Thank you for taking the time for your Medicare Wellness Visit. I appreciate your continued commitment to your health goals. Please review the care plan we discussed, and feel free to reach out if I can assist you further.  Medicare recommends these wellness visits once per year to help you and your care team stay ahead of potential health issues. These visits are designed to focus on prevention, allowing your provider to concentrate on managing your acute and chronic conditions during your regular appointments.  Please note that Annual Wellness Visits do not include a physical exam. Some assessments may be limited, especially if the visit was conducted virtually. If needed, we may recommend a separate in-person follow-up with your provider.  Ongoing Care Seeing your primary care provider every 3 to 6 months helps us  monitor your health and provide consistent, personalized care.   Referrals If a referral was made during today's visit and you haven't received any updates within two weeks, please contact the referred provider directly to check on the status.  Recommended Screenings:  Health Maintenance  Topic Date Due   COVID-19 Vaccine (5 - 2025-26 season) 03/19/2024   Zoster (Shingles) Vaccine (1 of 2) 08/15/2024*   Breast Cancer Screening  05/15/2025*   DEXA scan (bone density measurement)  05/15/2025*   Medicare Annual Wellness Visit  05/15/2025   DTaP/Tdap/Td vaccine (2 - Td or Tdap) 10/09/2028   Pneumococcal Vaccine for age over 20  Completed   Flu Shot  Completed   Meningitis B Vaccine  Aged Out  *Topic was postponed. The date shown is not the original due date.       05/15/2024    1:15 PM  Advanced Directives  Does Patient Have a Medical Advance Directive? Yes  Type of Estate Agent of [place];Living will  Does patient want to make changes to medical advance directive? No - Patient declined  Copy of Healthcare Power of Attorney in Chart? Yes  - validated most recent copy scanned in chart (See row information)   Advance Care Planning is important because it: Ensures you receive medical care that aligns with your values, goals, and preferences. Provides guidance to your family and loved ones, reducing the emotional burden of decision-making during critical moments.  Vision: Annual vision screenings are recommended for early detection of glaucoma, cataracts, and diabetic retinopathy. These exams can also reveal signs of chronic conditions such as diabetes and high blood pressure.  Dental: Annual dental screenings help detect early signs of oral cancer, gum disease, and other conditions linked to overall health, including heart disease and diabetes.

## 2024-05-26 ENCOUNTER — Other Ambulatory Visit: Payer: Self-pay | Admitting: Internal Medicine

## 2024-05-27 ENCOUNTER — Other Ambulatory Visit: Payer: Self-pay | Admitting: Internal Medicine

## 2024-05-28 ENCOUNTER — Other Ambulatory Visit: Payer: Self-pay

## 2024-05-28 ENCOUNTER — Other Ambulatory Visit (HOSPITAL_BASED_OUTPATIENT_CLINIC_OR_DEPARTMENT_OTHER): Payer: Self-pay

## 2024-05-28 MED ORDER — DICYCLOMINE HCL 10 MG PO CAPS
10.0000 mg | ORAL_CAPSULE | Freq: Two times a day (BID) | ORAL | 3 refills | Status: AC | PRN
  Filled 2024-05-28: qty 180, 90d supply, fill #0

## 2024-05-28 MED ORDER — GABAPENTIN 300 MG PO CAPS
300.0000 mg | ORAL_CAPSULE | Freq: Every day | ORAL | 1 refills | Status: AC
  Filled 2024-05-28: qty 90, 90d supply, fill #0
  Filled 2024-08-23: qty 90, 90d supply, fill #1

## 2024-05-28 MED ORDER — LEVOCETIRIZINE DIHYDROCHLORIDE 5 MG PO TABS
5.0000 mg | ORAL_TABLET | Freq: Every day | ORAL | 5 refills | Status: AC
  Filled 2024-05-28: qty 30, 30d supply, fill #0
  Filled 2024-07-03: qty 30, 30d supply, fill #1
  Filled 2024-07-19: qty 30, 30d supply, fill #2

## 2024-06-08 ENCOUNTER — Other Ambulatory Visit: Payer: Self-pay | Admitting: Internal Medicine

## 2024-06-08 ENCOUNTER — Other Ambulatory Visit (HOSPITAL_BASED_OUTPATIENT_CLINIC_OR_DEPARTMENT_OTHER): Payer: Self-pay

## 2024-06-08 MED ORDER — ROSUVASTATIN CALCIUM 5 MG PO TABS
5.0000 mg | ORAL_TABLET | Freq: Every day | ORAL | 5 refills | Status: AC
  Filled 2024-06-08: qty 30, 30d supply, fill #0
  Filled 2024-07-03: qty 30, 30d supply, fill #1
  Filled 2024-08-23: qty 30, 30d supply, fill #2

## 2024-06-11 ENCOUNTER — Other Ambulatory Visit (HOSPITAL_BASED_OUTPATIENT_CLINIC_OR_DEPARTMENT_OTHER): Payer: Self-pay

## 2024-06-12 ENCOUNTER — Ambulatory Visit: Admitting: Podiatry

## 2024-06-12 ENCOUNTER — Encounter: Payer: Self-pay | Admitting: Internal Medicine

## 2024-06-12 ENCOUNTER — Encounter: Payer: Self-pay | Admitting: Podiatry

## 2024-06-12 DIAGNOSIS — D2372 Other benign neoplasm of skin of left lower limb, including hip: Secondary | ICD-10-CM

## 2024-06-12 NOTE — Progress Notes (Deleted)
 Subjective:    Patient ID: Tina Riley, female    DOB: 1931/08/01, 89 y.o.   MRN: [id number]      HPI Tina Riley is here for No chief complaint on file.    Given Macrobid  x 5 days in March Last urine culture-06/2022-E. coli, pansensitive    Medications and allergies reviewed with patient and updated if appropriate.  Current Outpatient Medications on File Prior to Visit  Medication Sig Dispense Refill   Acetaminophen  Extra Strength 500 MG TABS Take 1 tablet (500 mg total) by mouth daily as needed for headache or mild pain. 30 tablet 0   albuterol  (VENTOLIN  HFA) 108 (90 Base) MCG/ACT inhaler Inhale 2 puffs into the lungs every 6 (six) hours as needed for shortness of breath or wheezing. 6.7 g 0   clonazePAM  (KLONOPIN ) 0.5 MG tablet Take 1 tablet (0.5 mg total) by mouth 2 (two) times daily as needed for anxiety. 60 tablet 5   dicyclomine  (BENTYL ) 10 MG capsule Take 1 capsule (10 mg total) by mouth 2 (two) times daily as needed for spasms. 180 capsule 3   erythromycin  ophthalmic ointment Apply a small amount to both eyes at bedtime. 3.5 g 6   Fluticasone -Umeclidin-Vilant (TRELEGY ELLIPTA ) 100-62.5-25 MCG/ACT AEPB Inhale 1 puff into the lungs daily. 60 each 2   gabapentin  (NEURONTIN ) 300 MG capsule Take 1 capsule (300 mg total) by mouth at bedtime. 90 capsule 1   levocetirizine (XYZAL ) 5 MG tablet Take 1 tablet (5 mg total) by mouth daily. 30 tablet 5   linaclotide  (LINZESS ) 145 MCG CAPS capsule Take 1 capsule (145 mcg total) by mouth daily before breakfast. 90 capsule 3   loperamide  (IMODIUM  A-D) 2 MG tablet Take 1 tablet by mouth three times a day as needed for loose stools (Patient not taking: Reported on 06/12/2024) 90 tablet 0   melatonin 3 MG TABS tablet Take 1 tablet by mouth every night at bedtime as needed for insomnia (Patient not taking: Reported on 06/12/2024) 30 tablet 0   mupirocin  ointment (BACTROBAN ) 2 % Apply 1 Application topically 2 (two) times daily. 22 g 0    ondansetron  (ZOFRAN -ODT) 4 MG disintegrating tablet Dissolve 1 tablet under the tongue every 6 (six) hours as needed for nausea. 120 tablet 0   pantoprazole  (PROTONIX ) 40 MG tablet Take 1 tablet (40 mg total) by mouth 2 (two) times daily. 180 tablet 3   Polyethyl Glycol-Propyl Glycol (SYSTANE OP) Place 1 drop into both eyes daily as needed (dry eyes).     propranolol  ER (INDERAL  LA) 60 MG 24 hr capsule Take 1 capsule (60 mg total) by mouth daily. 90 capsule 3   psyllium (REGULOID) 0.52 g capsule Take 2 g by mouth at bedtime. 2 gummies (Metamucil) (Patient not taking: Reported on 06/12/2024)     rosuvastatin  (CRESTOR ) 5 MG tablet Take 1 tablet (5 mg total) by mouth at bedtime. 30 tablet 5   triamcinolone  cream (KENALOG ) 0.1 % Apply 1 Application topically 2 (two) times daily. 80 g 0   valACYclovir  (VALTREX ) 500 MG tablet Take 2 tablets (1,000 mg total) by mouth daily. 180 tablet 2   No current facility-administered medications on file prior to visit.    Review of Systems     Objective:  There were no vitals filed for this visit. BP Readings from Last 3 Encounters:  04/16/24 122/80  03/11/24 (!) 134/59  03/09/24 (!) 171/70   Wt Readings from Last 3 Encounters:  05/15/24 108 lb (49 kg)  04/16/24 108 lb (49 kg)  03/09/24 106 lb (48.1 kg)   There is no height or weight on file to calculate BMI.    Physical Exam         Assessment & Plan:    See Problem List for Assessment and Plan of chronic medical problems.

## 2024-06-12 NOTE — Progress Notes (Signed)
  Subjective:  Patient ID: Tina Riley, female    DOB: 1932/06/29,   MRN: [id number]  Chief Complaint  Patient presents with   Tendonitis    It's still sore.   Nail Problem    I need my nails trimmed.    89 y.o. female presents for concern of left foot pain that has been ongoing for a while. She relate pain on the outside of her foot. Does have a lesion that is the center of the tenderness. He has been trying foot soaks and not helping much.   . Denies any other pedal complaints. Denies n/v/f/c.   Past Medical History:  Diagnosis Date   Arthritis    Asthma    Chicken pox    Depression    Diverticulitis    Genital warts    GERD (gastroesophageal reflux disease)    Hay fever    History of frequent urinary tract infections    Hypertension    IBS (irritable bowel syndrome)    Lactose intolerance    Peripheral neuropathy    Urinary incontinence     Objective:  Physical Exam: Vascular: DP/PT pulses 2/4 bilateral. CFT <3 seconds. Normal hair growth on digits. No edema.  Skin. No lacerations or abrasions bilateral feet. Hyperkeratotic lesion noted to lateral fifth metatasal base on left with disruption of skin lines.  Musculoskeletal: MMT 5/5 bilateral lower extremities in DF, PF, Inversion and Eversion. Deceased ROM in DF of ankle joint.  Neurological: Sensation intact to light touch.   Assessment:   1. Benign neoplasm of skin of foot, left      Plan:  Patient was evaluated and treated and all questions answered. X-rays reviewed and discussed with patient. No acute fractures or dislocations noted.  -Discussed benign skin lesions with patient and treatment options.  -Hyperkeratotic tissue was debrided with chisel without incident.  -Nails 1-5 bilateral trimmed as courtesy today.  -Applied salycylic acid treatment to area with dressing. Advised to remove bandaging tomorrow.  -Encouraged daily moisturizing -Discussed use of pumice stone -Advised good supportive  shoes and inserts -Patient to return to office as needed or sooner if condition worsens.    Asberry Failing, DPM

## 2024-06-13 ENCOUNTER — Ambulatory Visit: Admitting: Internal Medicine

## 2024-06-26 ENCOUNTER — Other Ambulatory Visit (HOSPITAL_COMMUNITY): Payer: Self-pay

## 2024-06-26 ENCOUNTER — Other Ambulatory Visit: Payer: Self-pay | Admitting: Internal Medicine

## 2024-07-02 ENCOUNTER — Other Ambulatory Visit (HOSPITAL_COMMUNITY): Payer: Self-pay

## 2024-07-03 ENCOUNTER — Ambulatory Visit: Payer: Self-pay

## 2024-07-03 ENCOUNTER — Observation Stay (HOSPITAL_COMMUNITY)
Admission: EM | Admit: 2024-07-03 | Discharge: 2024-07-05 | Disposition: A | Attending: Internal Medicine | Admitting: Internal Medicine

## 2024-07-03 ENCOUNTER — Other Ambulatory Visit (HOSPITAL_BASED_OUTPATIENT_CLINIC_OR_DEPARTMENT_OTHER): Payer: Self-pay

## 2024-07-03 ENCOUNTER — Other Ambulatory Visit: Payer: Self-pay

## 2024-07-03 ENCOUNTER — Observation Stay (HOSPITAL_COMMUNITY)

## 2024-07-03 DIAGNOSIS — R54 Age-related physical debility: Secondary | ICD-10-CM | POA: Diagnosis not present

## 2024-07-03 DIAGNOSIS — I5031 Acute diastolic (congestive) heart failure: Secondary | ICD-10-CM | POA: Insufficient documentation

## 2024-07-03 DIAGNOSIS — N3001 Acute cystitis with hematuria: Secondary | ICD-10-CM | POA: Diagnosis not present

## 2024-07-03 DIAGNOSIS — J45909 Unspecified asthma, uncomplicated: Secondary | ICD-10-CM | POA: Insufficient documentation

## 2024-07-03 DIAGNOSIS — K219 Gastro-esophageal reflux disease without esophagitis: Secondary | ICD-10-CM | POA: Insufficient documentation

## 2024-07-03 DIAGNOSIS — I110 Hypertensive heart disease with heart failure: Secondary | ICD-10-CM | POA: Diagnosis not present

## 2024-07-03 DIAGNOSIS — E119 Type 2 diabetes mellitus without complications: Secondary | ICD-10-CM | POA: Insufficient documentation

## 2024-07-03 DIAGNOSIS — Z9049 Acquired absence of other specified parts of digestive tract: Secondary | ICD-10-CM | POA: Insufficient documentation

## 2024-07-03 DIAGNOSIS — R339 Retention of urine, unspecified: Principal | ICD-10-CM

## 2024-07-03 DIAGNOSIS — N390 Urinary tract infection, site not specified: Secondary | ICD-10-CM | POA: Diagnosis present

## 2024-07-03 DIAGNOSIS — R531 Weakness: Secondary | ICD-10-CM | POA: Insufficient documentation

## 2024-07-03 DIAGNOSIS — Z79899 Other long term (current) drug therapy: Secondary | ICD-10-CM | POA: Insufficient documentation

## 2024-07-03 DIAGNOSIS — E46 Unspecified protein-calorie malnutrition: Secondary | ICD-10-CM | POA: Diagnosis not present

## 2024-07-03 DIAGNOSIS — Z96649 Presence of unspecified artificial hip joint: Secondary | ICD-10-CM | POA: Insufficient documentation

## 2024-07-03 LAB — RESPIRATORY PANEL BY PCR

## 2024-07-03 LAB — URINALYSIS, ROUTINE W REFLEX MICROSCOPIC
Bilirubin Urine: NEGATIVE
Glucose, UA: 0 mg/dL — AB
Ketones, ur: NEGATIVE mg/dL
Nitrite: NEGATIVE
Protein, ur: NEGATIVE mg/dL
Specific Gravity, Urine: 1.005 (ref 1.005–1.030)
pH: 6.5 (ref 5.0–8.0)

## 2024-07-03 LAB — CBC WITH DIFFERENTIAL/PLATELET
Abs Immature Granulocytes: 0.03 K/uL (ref 0.00–0.07)
Basophils Absolute: 0.1 K/uL (ref 0.0–0.1)
Basophils Relative: 1 %
Eosinophils Absolute: 0.1 K/uL (ref 0.0–0.5)
Eosinophils Relative: 1 %
HCT: 39.6 % (ref 36.0–46.0)
Hemoglobin: 13.1 g/dL (ref 12.0–15.0)
Immature Granulocytes: 0 %
Lymphocytes Relative: 18 %
Lymphs Abs: 1.9 K/uL (ref 0.7–4.0)
MCH: 36.7 pg — ABNORMAL HIGH (ref 26.0–34.0)
MCHC: 33.1 g/dL (ref 30.0–36.0)
MCV: 110.9 fL — ABNORMAL HIGH (ref 80.0–100.0)
Monocytes Absolute: 0.6 K/uL (ref 0.1–1.0)
Monocytes Relative: 6 %
Neutro Abs: 7.5 K/uL (ref 1.7–7.7)
Neutrophils Relative %: 74 %
Platelets: 235 K/uL (ref 150–400)
RBC: 3.57 MIL/uL — ABNORMAL LOW (ref 3.87–5.11)
RDW: 12.7 % (ref 11.5–15.5)
Smear Review: NORMAL
WBC: 10.3 K/uL (ref 4.0–10.5)
nRBC: 0 % (ref 0.0–0.2)

## 2024-07-03 LAB — I-STAT CHEM 8, ED
BUN: 18 mg/dL (ref 8–23)
Calcium, Ion: 1.11 mmol/L — ABNORMAL LOW (ref 1.15–1.40)
Chloride: 106 mmol/L (ref 98–111)
Creatinine, Ser: 1 mg/dL (ref 0.44–1.00)
Glucose, Bld: 103 mg/dL — ABNORMAL HIGH (ref 70–99)
HCT: 41 % (ref 36.0–46.0)
Hemoglobin: 13.9 g/dL (ref 12.0–15.0)
Potassium: 4.4 mmol/L (ref 3.5–5.1)
Sodium: 142 mmol/L (ref 135–145)
TCO2: 24 mmol/L (ref 22–32)

## 2024-07-03 LAB — COMPREHENSIVE METABOLIC PANEL WITH GFR
ALT: 7 U/L (ref 0–44)
AST: 18 U/L (ref 15–41)
Albumin: 4.1 g/dL (ref 3.5–5.0)
Alkaline Phosphatase: 119 U/L (ref 38–126)
Anion gap: 12 (ref 5–15)
BUN: 16 mg/dL (ref 8–23)
CO2: 23 mmol/L (ref 22–32)
Calcium: 9.4 mg/dL (ref 8.9–10.3)
Chloride: 106 mmol/L (ref 98–111)
Creatinine, Ser: 0.93 mg/dL (ref 0.44–1.00)
GFR, Estimated: 58 mL/min — ABNORMAL LOW (ref >60)
Glucose, Bld: 97 mg/dL (ref 70–99)
Potassium: 4.6 mmol/L (ref 3.5–5.1)
Sodium: 141 mmol/L (ref 135–145)
Total Bilirubin: 0.3 mg/dL (ref 0.0–1.2)
Total Protein: 6.8 g/dL (ref 6.5–8.1)

## 2024-07-03 LAB — URINALYSIS, MICROSCOPIC (REFLEX): WBC, UA: >50 WBC/hpf (ref 0–5)

## 2024-07-03 LAB — D-DIMER, QUANTITATIVE: D-Dimer, Quant: 0.62 ug{FEU}/mL — ABNORMAL HIGH (ref 0.00–0.50)

## 2024-07-03 MED ORDER — ONDANSETRON HCL 4 MG PO TABS
4.0000 mg | ORAL_TABLET | Freq: Four times a day (QID) | ORAL | Status: DC | PRN
  Administered 2024-07-04 (×2): 4 mg via ORAL
  Filled 2024-07-03 (×2): qty 1

## 2024-07-03 MED ORDER — ALBUTEROL SULFATE (2.5 MG/3ML) 0.083% IN NEBU: 2.5000 mg | INHALATION_SOLUTION | Freq: Four times a day (QID) | RESPIRATORY_TRACT | Status: DC | PRN

## 2024-07-03 MED ORDER — ONDANSETRON HCL 4 MG/2ML IJ SOLN
4.0000 mg | Freq: Four times a day (QID) | INTRAMUSCULAR | Status: DC | PRN
  Filled 2024-07-03: qty 2

## 2024-07-03 MED ORDER — MORPHINE SULFATE (PF) 2 MG/ML IV SOLN: 2.0000 mg | Freq: Four times a day (QID) | INTRAVENOUS | Status: DC | PRN

## 2024-07-03 MED ORDER — ACETAMINOPHEN 325 MG PO TABS
650.0000 mg | ORAL_TABLET | Freq: Once | ORAL | Status: AC
  Administered 2024-07-03: 15:00:00 650 mg via ORAL
  Filled 2024-07-03: qty 2

## 2024-07-03 MED ORDER — VALACYCLOVIR HCL 500 MG PO TABS
1000.0000 mg | ORAL_TABLET | Freq: Every day | ORAL | Status: AC
  Administered 2024-07-04 – 2024-07-05 (×2): 1000 mg via ORAL
  Filled 2024-07-03 (×2): qty 2

## 2024-07-03 MED ORDER — HYDRALAZINE HCL 20 MG/ML IJ SOLN
5.0000 mg | INTRAMUSCULAR | Status: DC | PRN
  Administered 2024-07-04: 01:00:00 5 mg via INTRAVENOUS
  Filled 2024-07-03: qty 1

## 2024-07-03 MED ORDER — SODIUM CHLORIDE 0.9 % IV SOLN
1.0000 g | Freq: Once | INTRAVENOUS | Status: AC
  Administered 2024-07-03: 16:00:00 1 g via INTRAVENOUS
  Filled 2024-07-03: qty 10

## 2024-07-03 MED ORDER — CETIRIZINE HCL 10 MG PO TABS
10.0000 mg | ORAL_TABLET | Freq: Every day | ORAL | Status: DC
  Administered 2024-07-04 – 2024-07-05 (×2): 10 mg via ORAL
  Filled 2024-07-03 (×2): qty 1

## 2024-07-03 MED ORDER — SODIUM CHLORIDE 0.9 % IV SOLN
1.0000 g | INTRAVENOUS | Status: DC
  Administered 2024-07-04: 14:00:00 1 g via INTRAVENOUS
  Filled 2024-07-03 (×2): qty 10

## 2024-07-03 MED ORDER — CLONAZEPAM 0.5 MG PO TABS
0.5000 mg | ORAL_TABLET | Freq: Two times a day (BID) | ORAL | Status: DC | PRN
  Administered 2024-07-04 (×2): 0.5 mg via ORAL
  Filled 2024-07-03 (×2): qty 1

## 2024-07-03 MED ORDER — POLYVINYL ALCOHOL 1.4 % OP SOLN: Freq: Every day | OPHTHALMIC | Status: DC | PRN

## 2024-07-03 MED ORDER — ROSUVASTATIN CALCIUM 5 MG PO TABS
5.0000 mg | ORAL_TABLET | Freq: Every day | ORAL | Status: DC
  Administered 2024-07-04: 21:00:00 5 mg via ORAL
  Filled 2024-07-03: qty 1

## 2024-07-03 MED ORDER — BISACODYL 5 MG PO TBEC: 5.0000 mg | DELAYED_RELEASE_TABLET | Freq: Every day | ORAL | Status: DC | PRN

## 2024-07-03 MED ORDER — PANTOPRAZOLE SODIUM 40 MG PO TBEC
40.0000 mg | DELAYED_RELEASE_TABLET | Freq: Two times a day (BID) | ORAL | Status: DC
  Administered 2024-07-04 (×2): 40 mg via ORAL
  Filled 2024-07-03 (×2): qty 1

## 2024-07-03 MED ORDER — ACETAMINOPHEN 325 MG PO TABS
650.0000 mg | ORAL_TABLET | Freq: Four times a day (QID) | ORAL | Status: DC | PRN
  Administered 2024-07-04 – 2024-07-05 (×2): 650 mg via ORAL
  Filled 2024-07-03 (×2): qty 2

## 2024-07-03 MED ORDER — GABAPENTIN 300 MG PO CAPS
300.0000 mg | ORAL_CAPSULE | Freq: Every day | ORAL | Status: DC
  Administered 2024-07-04: 21:00:00 300 mg via ORAL
  Filled 2024-07-03: qty 1

## 2024-07-03 MED ORDER — BUDESON-GLYCOPYRROL-FORMOTEROL 160-9-4.8 MCG/ACT IN AERO
2.0000 | INHALATION_SPRAY | Freq: Two times a day (BID) | RESPIRATORY_TRACT | Status: DC
  Administered 2024-07-03 – 2024-07-05 (×3): 2 via RESPIRATORY_TRACT
  Filled 2024-07-03: qty 5.9

## 2024-07-03 MED ORDER — ACETAMINOPHEN 650 MG RE SUPP: 650.0000 mg | Freq: Four times a day (QID) | RECTAL | Status: DC | PRN

## 2024-07-03 MED ORDER — PROPRANOLOL HCL ER 60 MG PO CP24
60.0000 mg | ORAL_CAPSULE | Freq: Every day | ORAL | Status: DC
  Administered 2024-07-04 – 2024-07-05 (×2): 60 mg via ORAL
  Filled 2024-07-03 (×2): qty 1

## 2024-07-03 MED ORDER — HEPARIN SODIUM (PORCINE) 5000 UNIT/ML IJ SOLN
5000.0000 [IU] | Freq: Three times a day (TID) | INTRAMUSCULAR | Status: DC
  Administered 2024-07-04 – 2024-07-05 (×4): 5000 [IU] via SUBCUTANEOUS
  Filled 2024-07-03 (×5): qty 1

## 2024-07-03 MED ORDER — CEFPODOXIME PROXETIL 100 MG PO TABS
100.0000 mg | ORAL_TABLET | Freq: Every day | ORAL | 0 refills | Status: AC
  Filled 2024-07-03: qty 5, 5d supply, fill #0

## 2024-07-03 MED ORDER — POLYETHYLENE GLYCOL 3350 17 G PO PACK
17.0000 g | PACK | Freq: Every day | ORAL | Status: DC
  Administered 2024-07-03 – 2024-07-05 (×3): 17 g via ORAL
  Filled 2024-07-03 (×3): qty 1

## 2024-07-03 NOTE — Telephone Encounter (Signed)
 FYI Only or Action Required?: FYI only for provider: ED advised.  Patient was last seen in primary care on 04/16/2024 by [name], MD.  Called Nurse Triage reporting Abdominal Pain and Urinary Retention.  Symptoms began yesterday.  Interventions attempted: Rest, hydration, or home remedies.  Symptoms are: gradually worsening.  Triage Disposition: Go to ED Now (Notify PCP)  Patient/caregiver understands and will follow disposition?: Yes                                  1. SYMPTOM: What's the main symptom you're concerned about? (e.g., frequency, incontinence)     Abdominal pressure across bottom of abdomin and urinary retention  2. ONSET: When did the symptoms start?     Last night through this morning 3. PAIN: Is there any pain? If Yes, ask: How bad is it? (Scale: 1-10; mild, moderate, severe)     Describes pain as pressure and states it feels as if something is going to fall out, rates pain 8-9 4. CAUSE: What do you think is causing the symptoms?     UTI, per patient 5. OTHER SYMPTOMS: Do you have any other symptoms? (e.g., blood in urine, fever, flank pain, pain with urination)     Mild abdominal distention, states she last emptied her bladder last night and was only able to dribble this morning  This RN advised ED. Patient stated her niece and nephew are unavailable and she does not have another means of transportation to the ED. This RN called EMS for patient.   Copied from CRM #[id number]. Topic: Clinical - Red Word Triage >> Jul 03, 2024  8:31 AM Amy B wrote: Red Word that prompted transfer to Nurse Triage: Tina Riley abdominal pain, unable to urinate  Reason for Disposition  [1] Unable to urinate (or only a few drops) > 4 hours AND [2] bladder feels very full (e.g., palpable bladder or strong urge to urinate)  Protocols used: Urinary Symptoms-A-AH

## 2024-07-03 NOTE — ED Notes (Signed)
 Pts nephew came to this nurse and stated that the PA came and spoke with them about pt going home with foley in place until she follows up with urology. Pts nephew said he did not think it was a good idea and was concerned about this. Nurse made PA aware of the concern.

## 2024-07-03 NOTE — ED Triage Notes (Signed)
 Pt brought in by [facility] for abdominal pain. Reports not being able to urinate normal since last night. Tenderness and 8/10 pain.  Vitals with PTAR BP 158/114 R 14 O2 95 RA P 72

## 2024-07-03 NOTE — H&P (Signed)
 History and Physical    Patient: Tina Riley FMW:[id number] DOB: 27-Aug-1931 DOA: 07/03/2024 DOS: the patient was seen and examined on 07/03/2024 . PCP: [name], MD  Patient coming from: Home Chief complaint: Chief Complaint  Patient presents with   Urinary Retention   HPI:  Tina Riley is a 89 y.o. female with past medical history  of HSV, allergic rhinitis, asthma, GERD, dysphagia, pulmonary nodule, protein calorie malnutrition, history of recurrent UTI, urinary retention,, HTN,DM meds include albuterol , Klonopin , Bentyl , Trelegy Ellipta , Linzess , Loperamide , propranolol , rosuvastatin , Valtrex  coming for anuria not being able to void since last night along with abdominal discomfort.  Patient is independent and lives at home.  Uses a walker at baseline.  No recent falls or any other complaints.  ED Course:  Vital signs in the ED were notable for the following:  Vitals:   07/03/24 1134 07/03/24 1444 07/03/24 1530 07/03/24 1942  BP: (!) 149/75 (!) 181/82 (!) 172/67   Pulse: 78 72 68   Temp: (!) 97.5 F (36.4 C) 98 F (36.7 C) 97.7 F (36.5 C) 97.6 F (36.4 C)  Resp: 20 (!) 22    Height:      Weight:      SpO2: 96% 100% 100%   TempSrc: Oral Oral Oral Oral  BMI (Calculated):      >>ED evaluation thus far shows: - CMP is within normal limits.  Ionized calcium  1.11. - CBC shows white count of 10.3 hemoglobin of 13.1 MCV of 110.9 and platelets of 235. -Abnormal urinalysis with more than 50 WBCs large leukocytes, many bacteria, yellow hazy urine.  >>While in the ED patient received the following: Medications  acetaminophen  (TYLENOL ) tablet 650 mg (650 mg Oral Given 07/03/24 1503)  cefTRIAXone  (ROCEPHIN ) 1 g in sodium chloride  0.9 % 100 mL IVPB (0 g Intravenous Stopped 07/03/24 1604)   Review of Systems  Gastrointestinal:  Positive for abdominal pain.  Genitourinary:  Positive for frequency.   Past Medical History:  Diagnosis Date   Arthritis     Asthma    Chicken pox    Depression    Diverticulitis    Genital warts    GERD (gastroesophageal reflux disease)    Hay fever    History of frequent urinary tract infections    Hypertension    IBS (irritable bowel syndrome)    Lactose intolerance    Peripheral neuropathy    Urinary incontinence    Past Surgical History:  Procedure Laterality Date   ABDOMINAL HYSTERECTOMY     Hip Replacement  2020   PARTIAL COLECTOMY  1980s   TONSILLECTOMY AND ADENOIDECTOMY  childhood   vocal cord tumor removal  2012    reports that she has never smoked. She has never used smokeless tobacco. She reports that she does not drink alcohol  and does not use drugs. Allergies[1] Family History  Problem Relation Age of Onset   Arthritis Mother    COPD Mother    Heart attack Father    Heart disease Father    Hypertension Father    Hyperlipidemia Father    Stroke Father    Stroke Sister    Hypertension Sister    Hyperlipidemia Sister    Hearing loss Sister    Cancer Sister    Arthritis Sister    Alcohol  abuse Son    Diabetes Son    Drug abuse Son    Heart attack Son    Hypertension Son    Prior to Admission medications  Medication Sig Start Date End Date Taking? Authorizing Provider  cefpodoxime  (VANTIN ) 100 MG tablet Take 1 tablet (100 mg total) by mouth daily for 5 days. 07/03/24 07/08/24 Yes Smoot, Lauraine LABOR, PA-C  Acetaminophen  Extra Strength 500 MG TABS Take 1 tablet (500 mg total) by mouth daily as needed for headache or mild pain. 07/21/22     albuterol  (VENTOLIN  HFA) 108 (90 Base) MCG/ACT inhaler Inhale 2 puffs into the lungs every 6 (six) hours as needed for shortness of breath or wheezing. 02/13/24   Merlynn Niki FALCON, FNP  clonazePAM  (KLONOPIN ) 0.5 MG tablet Take 1 tablet (0.5 mg total) by mouth 2 (two) times daily as needed for anxiety. 01/24/24   [name], MD  dicyclomine  (BENTYL ) 10 MG capsule Take 1 capsule (10 mg total) by mouth 2 (two) times daily as needed for spasms.  05/28/24   [name], MD  erythromycin  ophthalmic ointment Apply a small amount to both eyes at bedtime. 02/22/24     Fluticasone -Umeclidin-Vilant (TRELEGY ELLIPTA ) 100-62.5-25 MCG/ACT AEPB Inhale 1 puff into the lungs daily. 02/13/24   Merlynn Niki FALCON, FNP  gabapentin  (NEURONTIN ) 300 MG capsule Take 1 capsule (300 mg total) by mouth at bedtime. 05/28/24   [name], MD  levocetirizine (XYZAL ) 5 MG tablet Take 1 tablet (5 mg total) by mouth daily. 05/28/24   [name], MD  linaclotide  (LINZESS ) 145 MCG CAPS capsule Take 1 capsule (145 mcg total) by mouth daily before breakfast. 02/27/24   [name], MD  loperamide  (IMODIUM  A-D) 2 MG tablet Take 1 tablet by mouth three times a day as needed for loose stools Patient not taking: Reported on 06/12/2024 07/21/22     melatonin 3 MG TABS tablet Take 1 tablet by mouth every night at bedtime as needed for insomnia Patient not taking: Reported on 06/12/2024 07/21/22     mupirocin  ointment (BACTROBAN ) 2 % Apply 1 Application topically 2 (two) times daily. 12/20/23   [name], MD  ondansetron  (ZOFRAN -ODT) 4 MG disintegrating tablet Dissolve 1 tablet under the tongue every 6 (six) hours as needed for nausea. 02/13/24   Merlynn Niki FALCON, FNP  pantoprazole  (PROTONIX ) 40 MG tablet Take 1 tablet (40 mg total) by mouth 2 (two) times daily. 06/20/23   [name], MD  Polyethyl Glycol-Propyl Glycol (SYSTANE OP) Place 1 drop into both eyes daily as needed (dry eyes).    [provider]  propranolol  ER (INDERAL  LA) 60 MG 24 hr capsule Take 1 capsule (60 mg total) by mouth daily. 01/24/24   [name], MD  psyllium (REGULOID) 0.52 g capsule Take 2 g by mouth at bedtime. 2 gummies (Metamucil) Patient not taking: Reported on 06/12/2024    [provider]  rosuvastatin  (CRESTOR ) 5 MG tablet Take 1 tablet (5 mg total) by mouth at bedtime. 06/08/24   [name], MD   triamcinolone  cream (KENALOG ) 0.1 % Apply 1 Application topically 2 (two) times daily. 03/12/24   [name], MD  valACYclovir  (VALTREX ) 500 MG tablet Take 2 tablets (1,000 mg total) by mouth daily. 01/24/24   [name], MD  Vitals:   07/03/24 1134 07/03/24 1444 07/03/24 1530 07/03/24 1942  BP: (!) 149/75 (!) 181/82 (!) 172/67   Pulse: 78 72 68   Resp: 20 (!) 22    Temp: (!) 97.5 F (36.4 C) 98 F (36.7 C) 97.7 F (36.5 C) 97.6 F (36.4 C)  TempSrc: Oral Oral Oral Oral  SpO2: 96% 100% 100%   Weight:      Height:       Physical Exam Vitals reviewed.  Constitutional:      General: She is not in acute distress.    Appearance: She is not ill-appearing.  HENT:     Head: Normocephalic and atraumatic.  Eyes:     Extraocular Movements: Extraocular movements intact.  Cardiovascular:     Rate and Rhythm: Normal rate and regular rhythm.     Pulses: Normal pulses.     Heart sounds: Normal heart sounds.  Pulmonary:     Breath sounds: Normal breath sounds.     Comments: BL Basilar Crackles .  Abdominal:     General: Bowel sounds are normal. There is no distension.     Palpations: Abdomen is soft. There is no mass.     Tenderness: There is abdominal tenderness.  Musculoskeletal:     Right lower leg: No edema.     Left lower leg: No edema.  Neurological:     General: No focal deficit present.     Mental Status: She is alert and oriented to person, place, and time.  Psychiatric:        Mood and Affect: Mood normal.        Behavior: Behavior normal.     Labs on Admission: I have personally reviewed following labs and imaging studies CBC: Recent Labs  Lab 07/03/24 1105 07/03/24 1115  WBC 10.3  --   NEUTROABS 7.5  --   HGB 13.1 13.9  HCT 39.6 41.0  MCV 110.9*  --   PLT 235  --    Basic Metabolic Panel: Recent Labs  Lab 07/03/24 1105 07/03/24 1115  NA 141 142  K 4.6 4.4   CL 106 106  CO2 23  --   GLUCOSE 97 103*  BUN 16 18  CREATININE 0.93 1.00  CALCIUM  9.4  --    GFR: Estimated Creatinine Clearance: 25.5 mL/min (by C-G formula based on SCr of 1 mg/dL). Liver Function Tests: Recent Labs  Lab 07/03/24 1105  AST 18  ALT 7  ALKPHOS 119  BILITOT 0.3  PROT 6.8  ALBUMIN 4.1   No results for input(s): LIPASE, AMYLASE in the last 168 hours. No results for input(s): AMMONIA in the last 168 hours. Recent Labs    03/09/24 1114 07/03/24 1105 07/03/24 1115  BUN 14 16 18   CREATININE 1.00 0.93 1.00   Cardiac Enzymes: No results for input(s): CKTOTAL, CKMB, CKMBINDEX, TROPONINI in the last 168 hours. BNP (last 3 results) No results for input(s): PROBNP in the last 8760 hours. HbA1C: No results for input(s): HGBA1C in the last 72 hours. CBG: No results for input(s): GLUCAP in the last 168 hours. Lipid Profile: No results for input(s): CHOL, HDL, LDLCALC, TRIG, CHOLHDL, LDLDIRECT in the last 72 hours. Thyroid  Function Tests: No results for input(s): TSH, T4TOTAL, FREET4, T3FREE, THYROIDAB in the last 72 hours. Anemia Panel: No results for input(s): VITAMINB12, FOLATE, FERRITIN, TIBC, IRON, RETICCTPCT in the last 72 hours. Urine analysis:    Component Value Date/Time   COLORURINE YELLOW (A) 07/03/2024 1215   APPEARANCEUR HAZY (  A) 07/03/2024 1215   LABSPEC 1.005 07/03/2024 1215   PHURINE 6.5 07/03/2024 1215   GLUCOSEU 0 (A) 07/03/2024 1215   HGBUR SMALL (A) 07/03/2024 1215   BILIRUBINUR NEGATIVE 07/03/2024 1215   KETONESUR NEGATIVE 07/03/2024 1215   PROTEINUR NEGATIVE 07/03/2024 1215   NITRITE NEGATIVE 07/03/2024 1215   LEUKOCYTESUR LARGE (A) 07/03/2024 1215   Radiological Exams on Admission: DG Chest 2 View Result Date: 07/03/2024 EXAM: 2 VIEW(S) XRAY OF THE CHEST 07/03/2024 06:19:00 PM COMPARISON: 03/09/2024 CLINICAL HISTORY: cough/ crackles posterioir bases. FINDINGS: LINES, TUBES AND  DEVICES: Electronic device projecting over the left chest. LUNGS AND PLEURA: Shallow inspiration. No airspace disease or consolidation in the lungs. Atelectasis in the lung bases. No pleural effusion or pneumothorax. HEART AND MEDIASTINUM: Heart size and pulmonary vascularity are normal. Mediastinal contours appear intact. Calcification of the aorta. BONES AND SOFT TISSUES: Degenerative changes in the spine with mid-thoracic vertebral compression deformities resulting in kyphosis. Calcification of the aorta. IMPRESSION: 1. Atelectasis in the lung bases. 2. No acute process. Electronically signed by: Elsie Gravely MD 07/03/2024 06:29 PM EST RP Workstation: [id number]    Data Reviewed: Relevant notes from primary care and specialist visits, past discharge summaries as available in EHR, including Care Everywhere . Prior diagnostic testing as pertinent to current admission diagnoses, Updated medications and problem lists for reconciliation .ED course, including vitals, labs, imaging, treatment and response to treatment,Triage notes, nursing and pharmacy notes and ED provider's notes.Notable results as noted in HPI.Discussed case with EDMD/ ED APP/ or Specialty MD on call and as needed.  Assessment & Plan  >> Abdominal pain: Most likely to her UTI and also possible underlying constipation. Pt has had Ct Abd and Pelvis that don't identify any stones or abnormalities and we will repeat ct today.   >>Crackles : We will also obtain a dimer and CTA chest.    >>HTN: Vitals:   07/03/24 1014 07/03/24 1134 07/03/24 1444 07/03/24 1530  BP: (!) 191/77 (!) 149/75 (!) 181/82 (!) 172/67  PRN hydralazine . Cont propranolol .  DVT prophylaxis:  Heparin .  Consults:  None.  Advance Care Planning:    Code Status: Limited: Do not attempt resuscitation (DNR) -DNR-LIMITED -Do Not Intubate/DNI    Family Communication:  None.  Disposition Plan:  To be determined.  Severity of Illness: The appropriate patient  status for this patient is OBSERVATION. Observation status is judged to be reasonable and necessary in order to provide the required intensity of service to ensure the patient's safety. The patient's presenting symptoms, physical exam findings, and initial radiographic and laboratory data in the context of their medical condition is felt to place them at decreased risk for further clinical deterioration. Furthermore, it is anticipated that the patient will be medically stable for discharge from the hospital within 2 midnights of admission.   Unresulted Labs (From admission, onward)     Start     Ordered   07/04/24 0500  Comprehensive metabolic panel  Tomorrow morning,   R        07/03/24 1745   07/04/24 0500  CBC  Tomorrow morning,   R        07/03/24 1745   07/04/24 0500  Magnesium   Tomorrow morning,   R        07/03/24 1745   07/03/24 1955  Respiratory (~20 pathogens) panel by PCR  (Respiratory panel by PCR (~20 pathogens, ~24 hr TAT)  w precautions)  Once,   R        07/03/24  1954   07/03/24 1955  D-dimer, quantitative  Add-on,   AD        07/03/24 1954   07/03/24 1744  Magnesium   Add-on,   AD        07/03/24 1745   07/03/24 1723  Vitamin B12  Add-on,   AD        07/03/24 1722   07/03/24 1723  Folate  Add-on,   AD        07/03/24 1722   07/03/24 1723  TSH  Add-on,   AD        07/03/24 1722           Meds ordered this encounter  Medications   acetaminophen  (TYLENOL ) tablet 650 mg   cefTRIAXone  (ROCEPHIN ) 1 g in sodium chloride  0.9 % 100 mL IVPB    Antibiotic Indication::   UTI   cefpodoxime  (VANTIN ) 100 MG tablet    Sig: Take 1 tablet (100 mg total) by mouth daily for 5 days.    Dispense:  5 tablet    Refill:  0    Supervising Provider:   CLEOTILDE ROGUE [[id number]]   cefTRIAXone  (ROCEPHIN ) 1 g in sodium chloride  0.9 % 100 mL IVPB    Antibiotic Indication::   UTI   albuterol  (PROVENTIL ) (2.5 MG/3ML) 0.083% nebulizer solution 2.5 mg   clonazePAM  (KLONOPIN ) tablet 0.5 mg    budesonide -glycopyrrolate -formoterol  (BREZTRI ) 160-9-4.8 MCG/ACT inhaler 2 puff   gabapentin  (NEURONTIN ) capsule 300 mg   propranolol  ER (INDERAL  LA) 24 hr capsule 60 mg   rosuvastatin  (CRESTOR ) tablet 5 mg   cetirizine  (ZYRTEC ) tablet 10 mg   pantoprazole  (PROTONIX ) EC tablet 40 mg   heparin  injection 5,000 Units   OR Linked Order Group    acetaminophen  (TYLENOL ) tablet 650 mg    acetaminophen  (TYLENOL ) suppository 650 mg   morphine  (PF) 2 MG/ML injection 2 mg   bisacodyl  (DULCOLAX) EC tablet 5 mg   OR Linked Order Group    ondansetron  (ZOFRAN ) tablet 4 mg    ondansetron  (ZOFRAN ) injection 4 mg   hydrALAZINE  (APRESOLINE ) injection 5 mg   artificial tears ophthalmic solution   polyethylene glycol (MIRALAX  / GLYCOLAX ) packet 17 g   valACYclovir  (VALTREX ) tablet 1,000 mg     Orders Placed This Encounter  Procedures   Respiratory (~20 pathogens) panel by PCR   DG Chest 2 View   CBC with Differential   Comprehensive metabolic panel   Urinalysis, Routine w reflex microscopic -Urine, Clean Catch   Urinalysis, Microscopic (reflex)   Vitamin B12   Folate   TSH   Comprehensive metabolic panel   CBC   Magnesium    Magnesium    D-dimer, quantitative   DIET DYS 3 Room service appropriate? Yes with Assist; Fluid consistency: Thin   Bladder scan   Insert foley catheter   Ambulate with assistance   Strict intake and output   Bladder scan   Cardiac Monitoring Continuous x 24 hours Indications for use: Other; Other indications for use: crackles   Vital signs   Notify physician (specify)   Refer to Sidebar Report Refer to ICU, Med-Surg, Progressive, and Step-Down Mobility Protocol Sidebars   Initiate Adult Central Line Maintenance and Catheter Clearance Protocol for patients with central line (CVC, PICC, Port, Hemodialysis, Trialysis)   Daily weights   Intake and Output   Initiate CHG Protocol for patients in ICU/SD or any patient with a central line or foley catheter   Do not place  and if present remove PureWick  Initiate Oral Care Protocol   Initiate Carrier Fluid Protocol   RN may order General Admission PRN Orders utilizing General Admission PRN medications (through manage orders) for the following patient needs: allergy symptoms (Claritin ), cold sores (Carmex), cough (Robitussin DM), eye irritation (Liquifilm Tears), hemorrhoids (Tucks), indigestion (Maalox), minor skin irritation (Hydrocortisone Cream), muscle pain Lucienne Gay), nose irritation (saline nasal spray) and sore throat (Chloraseptic spray).   Do not attempt resuscitation (DNR)- Limited -Do Not Intubate (DNI)   Consult to hospitalist   Droplet precaution   Patient needs PT consult as soon as possible.  Discharge is pending PT consult.  Please call  PT eval and treat   Pulse oximetry check with vital signs   Oxygen therapy Mode or (Route): Nasal cannula; Liters Per Minute: 2; Keep O2 saturation between: greater than 92 %   I-stat chem 8, ED (not at [facility], [facility] or [facility])   EKG 12-Lead   Place in observation (patient's expected length of stay will be less than 2 midnights)   Aspiration precautions   Fall precautions    Author: Mario LULLA Blanch, MD 12 pm- 8 pm. [facility]. 07/03/2024 7:55 PM Please note for any communication after hours contact [facility] Assigned provider on call on Amion.       [1]  Allergies Allergen Reactions   Cephalexin Palpitations   Ciprofloxacin Palpitations   Other Itching   Meperidine Hcl     Other reaction(s): Other Doesn't work   Sulfa Antibiotics Nausea And Vomiting   Cefdinir  Nausea Only   Doxycycline Nausea Only

## 2024-07-03 NOTE — ED Provider Notes (Signed)
 Initial plan patient was to be discharged.  Patient's family member here with significant concerns.  She has been very weak, typically ambulates with a walker however concern due to weakness, new leg bag due to UTI.  Patient lives by herself.  Will plan on discussing admission with hospitalist for weakness in setting of UTI.  Physical Exam  BP (!) 172/67 (BP Location: Left Arm)   Pulse 68   Temp 97.7 F (36.5 C) (Oral)   Resp (!) 22   Ht 4' 11 (1.499 m)   Wt 50 kg   SpO2 100%   BMI 22.26 kg/m   Physical Exam Vitals and nursing note reviewed.  Constitutional:      General: She is not in acute distress.    Appearance: She is well-developed. She is not ill-appearing or diaphoretic.  HENT:     Head: Atraumatic.  Eyes:     Pupils: Pupils are equal, round, and reactive to light.  Cardiovascular:     Rate and Rhythm: Normal rate and regular rhythm.  Pulmonary:     Effort: Pulmonary effort is normal. No respiratory distress.  Abdominal:     General: There is no distension.     Palpations: Abdomen is soft.  Musculoskeletal:        General: Normal range of motion.     Cervical back: Normal range of motion and neck supple.  Skin:    General: Skin is warm and dry.  Neurological:     Mental Status: She is alert.     Procedures  Procedures Labs Reviewed  CBC WITH DIFFERENTIAL/PLATELET - Abnormal; Notable for the following components:      Result Value   RBC 3.57 (*)    MCV 110.9 (*)    MCH 36.7 (*)    All other components within normal limits  COMPREHENSIVE METABOLIC PANEL WITH GFR - Abnormal; Notable for the following components:   GFR, Estimated 58 (*)    All other components within normal limits  URINALYSIS, ROUTINE W REFLEX MICROSCOPIC - Abnormal; Notable for the following components:   Color, Urine YELLOW (*)    APPearance HAZY (*)    Glucose, UA 0 (*)    Hgb urine dipstick SMALL (*)    Leukocytes,Ua LARGE (*)    All other components within normal limits   URINALYSIS, MICROSCOPIC (REFLEX) - Abnormal; Notable for the following components:   Bacteria, UA MANY (*)    All other components within normal limits  I-STAT CHEM 8, ED - Abnormal; Notable for the following components:   Glucose, Bld 103 (*)    Calcium , Ion 1.11 (*)    All other components within normal limits  VITAMIN B12  FOLATE  TSH  COMPREHENSIVE METABOLIC PANEL WITH GFR  CBC  MAGNESIUM   MAGNESIUM    No results found.  ED Course / MDM  Patient seen at bedside.  Initial plan to DC home however family concerned due to weakness, UTI.  She already walks with a walker at home and is very unstable.  Has occasional help however no one that can help her persistently with her new Foley catheter for urinary retention likely due to UTI.  Given this we will plan on admission to medicine team for weakness and UTI  Clinical Course as of 07/03/24 1752  Tue Jul 03, 2024  1714 Dr. Tobie with medicine for admission [BH]    Clinical Course User Index [BH] Alesandra Smart A, PA-C   Updated patient's family at bedside.  Both agreeable for  admission.  [facility] to admit  The patient appears reasonably stabilized for admission considering the current resources, flow, and capabilities available in the ED at this time, and I doubt any other [facility] requiring further screening and/or treatment in the ED prior to admission.    Medical Decision Making Amount and/or Complexity of Data Reviewed Independent Historian:     Details: Family at bedside External Data Reviewed: labs, radiology and notes. Labs: ordered. Decision-making details documented in ED Course.  Risk OTC drugs. Prescription drug management. Decision regarding hospitalization. Diagnosis or treatment significantly limited by social determinants of health.          Tenesha Garza A, PA-C 07/03/24 1753    Freddi Hamilton, MD 07/03/24 1901

## 2024-07-03 NOTE — Discharge Instructions (Addendum)
 As we discussed, it is like you have a urinary tract infection that is likely causing your inability to urinate.  We have given you a dose of antibiotics and prescribed antibiotics to go home with as well.  Please fill and take this as prescribed in its entirety.  I did hear back from the pharmacist at your preferred pharmacy, they will not have the antibiotic until tomorrow which is okay because we are giving you the first dose here before you leave.  You are also going home with the Foley catheter in place.  You will need to call urology to schedule follow-up appointment to have this removed.  Please call at your earliest convenience.  Return if development of any new or worsening symptoms

## 2024-07-03 NOTE — ED Provider Notes (Signed)
 Agency EMERGENCY DEPARTMENT AT [facility] Provider Note   CSN: [id number] Arrival date & time: 07/03/24  1002     Patient presents with: Urinary Retention   Tina Riley is a 89 y.o. female.   Patient with history of asthma, GERD, frequent UTIs, hypertension presents today with complaints of urinary retention. Reports that the last time she urinated was last night. Reports that she has had this previously and reports she was placed on macrobid  last time with improvement. Reports feeling of need to urinate, however nothing comes out. Denies nausea, vomiting, or diarrhea. No fevers or chills. No back pain. Does have abdominal pain.   The history is provided by the patient. No language interpreter was used.       Prior to Admission medications  Medication Sig Start Date End Date Taking? Authorizing Provider  Acetaminophen  Extra Strength 500 MG TABS Take 1 tablet (500 mg total) by mouth daily as needed for headache or mild pain. 07/21/22     albuterol  (VENTOLIN  HFA) 108 (90 Base) MCG/ACT inhaler Inhale 2 puffs into the lungs every 6 (six) hours as needed for shortness of breath or wheezing. 02/13/24   Merlynn Niki FALCON, FNP  clonazePAM  (KLONOPIN ) 0.5 MG tablet Take 1 tablet (0.5 mg total) by mouth 2 (two) times daily as needed for anxiety. 01/24/24   [name], MD  dicyclomine  (BENTYL ) 10 MG capsule Take 1 capsule (10 mg total) by mouth 2 (two) times daily as needed for spasms. 05/28/24   [name], MD  erythromycin  ophthalmic ointment Apply a small amount to both eyes at bedtime. 02/22/24     Fluticasone -Umeclidin-Vilant (TRELEGY ELLIPTA ) 100-62.5-25 MCG/ACT AEPB Inhale 1 puff into the lungs daily. 02/13/24   Merlynn Niki FALCON, FNP  gabapentin  (NEURONTIN ) 300 MG capsule Take 1 capsule (300 mg total) by mouth at bedtime. 05/28/24   [name], MD  levocetirizine (XYZAL ) 5 MG tablet Take 1 tablet (5 mg total) by mouth daily. 05/28/24    [name], MD  linaclotide  (LINZESS ) 145 MCG CAPS capsule Take 1 capsule (145 mcg total) by mouth daily before breakfast. 02/27/24   [name], MD  loperamide  (IMODIUM  A-D) 2 MG tablet Take 1 tablet by mouth three times a day as needed for loose stools Patient not taking: Reported on 06/12/2024 07/21/22     melatonin 3 MG TABS tablet Take 1 tablet by mouth every night at bedtime as needed for insomnia Patient not taking: Reported on 06/12/2024 07/21/22     mupirocin  ointment (BACTROBAN ) 2 % Apply 1 Application topically 2 (two) times daily. 12/20/23   [name], MD  ondansetron  (ZOFRAN -ODT) 4 MG disintegrating tablet Dissolve 1 tablet under the tongue every 6 (six) hours as needed for nausea. 02/13/24   Merlynn Niki FALCON, FNP  pantoprazole  (PROTONIX ) 40 MG tablet Take 1 tablet (40 mg total) by mouth 2 (two) times daily. 06/20/23   [name], MD  Polyethyl Glycol-Propyl Glycol (SYSTANE OP) Place 1 drop into both eyes daily as needed (dry eyes).    [provider]  propranolol  ER (INDERAL  LA) 60 MG 24 hr capsule Take 1 capsule (60 mg total) by mouth daily. 01/24/24   [name], MD  psyllium (REGULOID) 0.52 g capsule Take 2 g by mouth at bedtime. 2 gummies (Metamucil) Patient not taking: Reported on 06/12/2024    [provider]  rosuvastatin  (CRESTOR ) 5 MG tablet Take 1 tablet (5 mg total) by mouth at  bedtime. 06/08/24   [name], MD  triamcinolone  cream (KENALOG ) 0.1 % Apply 1 Application topically 2 (two) times daily. 03/12/24   [name], MD  valACYclovir  (VALTREX ) 500 MG tablet Take 2 tablets (1,000 mg total) by mouth daily. 01/24/24   [name], MD    Allergies: Cephalexin, Ciprofloxacin, Other, Meperidine hcl, Sulfa antibiotics, Cefdinir , and Doxycycline    Review of Systems  Gastrointestinal:  Positive for abdominal distention and abdominal pain.  All other systems reviewed and are  negative.   Updated Vital Signs BP (!) 191/77 (BP Location: Left Arm)   Pulse 79   Temp (!) 97.5 F (36.4 C) (Oral)   Resp 16   Ht 4' 11 (1.499 m)   Wt 50 kg   SpO2 95%   BMI 22.26 kg/m   Physical Exam Vitals and nursing note reviewed.  Constitutional:      General: She is not in acute distress.    Appearance: Normal appearance. She is normal weight. She is not ill-appearing, toxic-appearing or diaphoretic.  HENT:     Head: Normocephalic and atraumatic.  Cardiovascular:     Rate and Rhythm: Normal rate.  Pulmonary:     Effort: Pulmonary effort is normal. No respiratory distress.  Musculoskeletal:        General: Normal range of motion.     Cervical back: Normal range of motion.  Skin:    General: Skin is warm and dry.  Neurological:     General: No focal deficit present.     Mental Status: She is alert.  Psychiatric:        Mood and Affect: Mood normal.        Behavior: Behavior normal.     (all labs ordered are listed, but only abnormal results are displayed) Labs Reviewed  CBC WITH DIFFERENTIAL/PLATELET - Abnormal; Notable for the following components:      Result Value   RBC 3.57 (*)    MCV 110.9 (*)    MCH 36.7 (*)    All other components within normal limits  COMPREHENSIVE METABOLIC PANEL WITH GFR - Abnormal; Notable for the following components:   GFR, Estimated 58 (*)    All other components within normal limits  URINALYSIS, ROUTINE W REFLEX MICROSCOPIC - Abnormal; Notable for the following components:   Color, Urine YELLOW (*)    APPearance HAZY (*)    Glucose, UA 0 (*)    Hgb urine dipstick SMALL (*)    Leukocytes,Ua LARGE (*)    All other components within normal limits  URINALYSIS, MICROSCOPIC (REFLEX) - Abnormal; Notable for the following components:   Bacteria, UA MANY (*)    All other components within normal limits  I-STAT CHEM 8, ED - Abnormal; Notable for the following components:   Glucose, Bld 103 (*)    Calcium , Ion 1.11 (*)    All  other components within normal limits    EKG: None  Radiology: No results found.   Procedures   Medications Ordered in the ED  cefTRIAXone  (ROCEPHIN ) 1 g in sodium chloride  0.9 % 100 mL IVPB (has no administration in time range)  acetaminophen  (TYLENOL ) tablet 650 mg (650 mg Oral Given 07/03/24 1503)                                    Medical Decision Making Amount and/or Complexity of Data Reviewed Labs: ordered.   This patient  is a 89 y.o. female who presents to the ED for concern of urinary retention  Past Medical History / Co-morbidities / Social History:  has a past medical history of Arthritis, Asthma, Chicken pox, Depression, Diverticulitis, Genital warts, GERD (gastroesophageal reflux disease), Hay fever, History of frequent urinary tract infections, Hypertension, IBS (irritable bowel syndrome), Lactose intolerance, Peripheral neuropathy, and Urinary incontinence.  Additional history: Chart reviewed. Pertinent results include: last urinalysis in 2023, got IV rocephin , culture grew pan sensitive e coli  Physical Exam: Physical exam performed. The pertinent findings include: generalized abdominal tenderness and distention, resolved after foley placement  Lab Tests: I ordered, and personally interpreted labs.  The pertinent results include:  no leukocytosis, UA with leukocytes, >50 WBC, many bacteria concerning for infection   Imaging Studies: Considered CT imaging, however pain resolved after foley placement, urine infectious likely etiology, no signs of sepsis or symptoms to warrant imaging   Medications: I ordered medication including Tylenol , Rocephin   for pain, UTI. Reevaluation of the patient after these medicines showed that the patient improved. I have reviewed the patients home medicines and have made adjustments as needed.  Consultations Obtained: I requested consultation with the ED pharmacy,  and discussed lab and imaging findings as well as pertinent  plan - they recommend: IV rocephin  here and transition to Vantin  100 mg daily x 5 days. Does have listed allergy to some cephalosporins, however looks like she has gotten these medications in the past and not had issues. No listed airway complications from antibiotics.    Disposition: After consideration of the diagnostic results and the patients response to treatment, I feel that emergency department workup does not suggest an emergent condition requiring admission or immediate intervention beyond what has been performed at this time. The plan is: discharge with antibiotics, urology follow-up, return precautions. Evaluation and diagnostic testing in the emergency department does not suggest an emergent condition requiring admission or immediate intervention beyond what has been performed at this time.  Plan for discharge with close PCP follow-up.  Patient is understanding and amenable with plan, educated on red flag symptoms that would prompt immediate return.  Patient discharged in stable condition.  Final diagnoses:  Urinary retention  Acute cystitis with hematuria    ED Discharge Orders          Ordered    cefpodoxime  (VANTIN ) 100 MG tablet  Daily        07/03/24 1516          An After Visit Summary was printed and given to the patient.      Kile Kabler A, PA-C 07/03/24 1518    Tegeler, Lonni PARAS, MD 07/03/24 [phone]

## 2024-07-03 NOTE — ED Notes (Signed)
 Nurse went over discharge instructions with pts responsible party (nephew) and demonstrated the use of the catheter bags. Pts responsible party expressed concern about this being too much for his aunt to manage at home on her own, since she lives alone. Oncoming provider made aware of his concerns and came and spoke with pt and nephew about next steps.

## 2024-07-04 ENCOUNTER — Observation Stay (HOSPITAL_BASED_OUTPATIENT_CLINIC_OR_DEPARTMENT_OTHER)

## 2024-07-04 ENCOUNTER — Observation Stay (HOSPITAL_COMMUNITY)

## 2024-07-04 DIAGNOSIS — N3001 Acute cystitis with hematuria: Secondary | ICD-10-CM

## 2024-07-04 DIAGNOSIS — N3000 Acute cystitis without hematuria: Secondary | ICD-10-CM | POA: Diagnosis not present

## 2024-07-04 DIAGNOSIS — R5381 Other malaise: Secondary | ICD-10-CM | POA: Diagnosis not present

## 2024-07-04 DIAGNOSIS — I5031 Acute diastolic (congestive) heart failure: Secondary | ICD-10-CM | POA: Diagnosis not present

## 2024-07-04 DIAGNOSIS — R339 Retention of urine, unspecified: Principal | ICD-10-CM

## 2024-07-04 LAB — COMPREHENSIVE METABOLIC PANEL WITH GFR
ALT: 6 U/L (ref 0–44)
AST: 14 U/L — ABNORMAL LOW (ref 15–41)
Albumin: 3.9 g/dL (ref 3.5–5.0)
Alkaline Phosphatase: 111 U/L (ref 38–126)
Anion gap: 13 (ref 5–15)
BUN: 16 mg/dL (ref 8–23)
CO2: 23 mmol/L (ref 22–32)
Calcium: 9.1 mg/dL (ref 8.9–10.3)
Chloride: 102 mmol/L (ref 98–111)
Creatinine, Ser: 0.77 mg/dL (ref 0.44–1.00)
GFR, Estimated: >60 mL/min (ref >60)
Glucose, Bld: 127 mg/dL — ABNORMAL HIGH (ref 70–99)
Potassium: 3.9 mmol/L (ref 3.5–5.1)
Sodium: 137 mmol/L (ref 135–145)
Total Bilirubin: 0.3 mg/dL (ref 0.0–1.2)
Total Protein: 6.5 g/dL (ref 6.5–8.1)

## 2024-07-04 LAB — ECHOCARDIOGRAM COMPLETE
Calc EF: 56.9 %
Height: 59 in
S' Lateral: 2 cm
Single Plane A2C EF: 59.2 %
Single Plane A4C EF: 56.2 %
Weight: 1724.88 [oz_av]

## 2024-07-04 LAB — PRO BRAIN NATRIURETIC PEPTIDE: Pro Brain Natriuretic Peptide: 1721 pg/mL — ABNORMAL HIGH (ref <300.0)

## 2024-07-04 LAB — TSH: TSH: 1.99 u[IU]/mL (ref 0.350–4.500)

## 2024-07-04 LAB — CBC
HCT: 38.8 % (ref 36.0–46.0)
Hemoglobin: 13.4 g/dL (ref 12.0–15.0)
MCH: 37 pg — ABNORMAL HIGH (ref 26.0–34.0)
MCHC: 34.5 g/dL (ref 30.0–36.0)
MCV: 107.2 fL — ABNORMAL HIGH (ref 80.0–100.0)
Platelets: 208 K/uL (ref 150–400)
RBC: 3.62 MIL/uL — ABNORMAL LOW (ref 3.87–5.11)
RDW: 12.6 % (ref 11.5–15.5)
WBC: 8.8 K/uL (ref 4.0–10.5)
nRBC: 0 % (ref 0.0–0.2)

## 2024-07-04 LAB — FOLATE: Folate: 11.7 ng/mL (ref >5.9)

## 2024-07-04 LAB — MAGNESIUM
Magnesium: 1.9 mg/dL (ref 1.7–2.4)
Magnesium: 2 mg/dL (ref 1.7–2.4)

## 2024-07-04 LAB — VITAMIN B12: Vitamin B-12: 225 pg/mL (ref 180–914)

## 2024-07-04 MED ORDER — IOHEXOL 350 MG/ML SOLN
50.0000 mL | Freq: Once | INTRAVENOUS | Status: AC | PRN
  Administered 2024-07-04: 02:00:00 50 mL via INTRAVENOUS

## 2024-07-04 MED ORDER — CHLORHEXIDINE GLUCONATE CLOTH 2 % EX PADS
6.0000 | MEDICATED_PAD | Freq: Every day | CUTANEOUS | Status: DC
  Administered 2024-07-04 – 2024-07-05 (×2): 6 via TOPICAL

## 2024-07-04 MED ORDER — FUROSEMIDE 10 MG/ML IJ SOLN
20.0000 mg | Freq: Once | INTRAMUSCULAR | Status: AC
  Administered 2024-07-04: 12:00:00 20 mg via INTRAVENOUS
  Filled 2024-07-04: qty 2

## 2024-07-04 MED ORDER — TAMSULOSIN HCL 0.4 MG PO CAPS
0.4000 mg | ORAL_CAPSULE | Freq: Every day | ORAL | Status: DC
  Administered 2024-07-04 – 2024-07-05 (×2): 0.4 mg via ORAL
  Filled 2024-07-04 (×2): qty 1

## 2024-07-04 NOTE — Evaluation (Signed)
 Occupational Therapy Evaluation Patient Details Name: Tina Riley MRN: [id number] DOB: [birth year] Today's Date: 07/04/2024   History of Present Illness   89 y.o. female presents 12/16 with abdominal discomfort and inability to void since previous night. Suspected UTI and possible underlying constipation. PMH: HSV, allergic rhinitis, asthma, GERD, dysphagia, pulmonary nodule, protein calorie malnutrition, history of recurrent UTI, urinary retention, HTN     Clinical Impressions PTA Pt required Min assistance from aide to manage ADL and IADL tasks and Mod I functional mobility with RW. Pt currently Mod I functional mobility with RW and is requiring up to Min A for ADL tasks, functioning near baseline abilities. Pt is primarily limited by generalized weakness and decreased knowledge of new foley catheter management. OT to continue to follow Pt acutely to facilitate progress towards goals. Main barrier to d/c is ability to manage foley catheter. Pt would benefit from increased [facility] aide services and increased coverage upon return home. OT to recommend HHOT services to increase independence with ADL tasks in home environment.      If plan is discharge home, recommend the following:   A little help with walking and/or transfers;A little help with bathing/dressing/bathroom;Assistance with cooking/housework;Direct supervision/assist for medications management;Direct supervision/assist for financial management;Assist for transportation;Help with stairs or ramp for entrance     Functional Status Assessment   Patient has had a recent decline in their functional status and demonstrates the ability to make significant improvements in function in a reasonable and predictable amount of time.     Equipment Recommendations   BSC/3in1     Recommendations for Other Services         Precautions/Restrictions   Precautions Precautions: Fall Recall of Precautions/Restrictions:  Intact Restrictions Weight Bearing Restrictions Per Provider Order: No     Mobility Bed Mobility               General bed mobility comments: Pt greeted in recliner and returned to recliner    Transfers Overall transfer level: Modified independent Equipment used: Rolling walker (2 wheels)                      Balance Overall balance assessment: Mild deficits observed, not formally tested                                         ADL either performed or assessed with clinical judgement   ADL Overall ADL's : Needs assistance/impaired Eating/Feeding: Set up Eating/Feeding Details (indicate cue type and reason): Set up to manage small containers and open packets Grooming: Set up;Sitting   Upper Body Bathing: Minimal assistance   Lower Body Bathing: Minimal assistance   Upper Body Dressing : Set up   Lower Body Dressing: Minimal assistance   Toilet Transfer: Supervision/safety   Toileting- Clothing Manipulation and Hygiene: Total assistance Toileting - Clothing Manipulation Details (indicate cue type and reason): Pt with new foley catheter             Vision Baseline Vision/History: 1 Wears glasses;6 Macular Degeneration Patient Visual Report: No change from baseline       Perception         Praxis         Pertinent Vitals/Pain Pain Assessment Pain Assessment: 0-10 Pain Score: 6  Pain Location: lower back Pain Descriptors / Indicators: Discomfort, Aching Pain Intervention(s): Limited activity within patient's tolerance, Monitored during session, Repositioned  Extremity/Trunk Assessment Upper Extremity Assessment Upper Extremity Assessment: Generalized weakness   Lower Extremity Assessment Lower Extremity Assessment: Defer to PT evaluation   Cervical / Trunk Assessment Cervical / Trunk Assessment: Kyphotic   Communication Communication Communication: No apparent difficulties   Cognition Arousal: Alert Behavior  During Therapy: WFL for tasks assessed/performed Cognition: No apparent impairments             OT - Cognition Comments: Pt pleasant and agreeable to session. Enjoys communication.                 Following commands: Intact       Cueing  General Comments   Cueing Techniques: Verbal cues  VSS on RA.   Exercises     Shoulder Instructions      Home Living Family/patient expects to be discharged to:: Private residence Living Arrangements: Alone Available Help at Discharge: Family;Personal care attendant;Available PRN/intermittently Type of Home: Apartment Home Access: Ramped entrance     Home Layout: One level     Bathroom Shower/Tub: Walk-in shower         Home Equipment: Agricultural Consultant (2 wheels);Grab bars - toilet;Grab bars - tub/shower          Prior Functioning/Environment Prior Level of Function : Independent/Modified Independent             Mobility Comments: Pt using RW, reports one assisted fall within past 3 months. ADLs Comments: Independent with medication management using pill box. Pt nephew manages her finances. Pt has an aide who comes M-Th,2 hours/day to assist with ADLs and IADL's. Does not drive. [employer] on Wheels    OT Problem List: Decreased strength;Decreased activity tolerance;Impaired balance (sitting and/or standing);Pain;Decreased safety awareness   OT Treatment/Interventions: Self-care/ADL training;Therapeutic exercise;Energy conservation;DME and/or AE instruction;Patient/family education;Balance training      OT Goals(Current goals can be found in the care plan section)   Acute Rehab OT Goals Patient Stated Goal: to go home OT Goal Formulation: With patient Time For Goal Achievement: 07/18/24 Potential to Achieve Goals: Good ADL Goals Pt Will Perform Eating: with modified independence;sitting Pt Will Perform Upper Body Dressing: Independently;sitting Pt Will Perform Lower Body Dressing: with modified  independence;sitting/lateral leans;with adaptive equipment Pt/caregiver will Perform Home Exercise Program: Both right and left upper extremity;With theraputty;With written HEP provided (Fine motor)   OT Frequency:  Min 1X/week    Co-evaluation              AM-PAC OT 6 Clicks Daily Activity     Outcome Measure Help from another person eating meals?: A Little Help from another person taking care of personal grooming?: A Little Help from another person toileting, which includes using toliet, bedpan, or urinal?: A Little Help from another person bathing (including washing, rinsing, drying)?: A Little Help from another person to put on and taking off regular upper body clothing?: A Little Help from another person to put on and taking off regular lower body clothing?: A Little 6 Click Score: 18   End of Session Equipment Utilized During Treatment: Rolling walker (2 wheels)  Activity Tolerance: Patient tolerated treatment well Patient left: in chair;with call bell/phone within reach;with chair alarm set  OT Visit Diagnosis: Unsteadiness on feet (R26.81);Muscle weakness (generalized) (M62.81);History of falling (Z91.81)                Time: 9080-9060 OT Time Calculation (min): 20 min Charges:  OT General Charges $OT Visit: 1 Visit OT Evaluation $OT Eval Low Complexity: 1 Low  Maurilio  L, OTR/L.  [facility] Acute Rehabilitation  Office: [phone]   Maurilio PARAS Enrique Manganaro 07/04/2024, 9:48 AM

## 2024-07-04 NOTE — Progress Notes (Signed)
°  Echocardiogram 2D Echocardiogram has been performed.  LAMON MAXWELL 07/04/2024, 3:05 PM

## 2024-07-04 NOTE — Plan of Care (Signed)
   Problem: Education: Goal: Knowledge of General Education information will improve Description: Including pain rating scale, medication(s)/side effects and non-pharmacologic comfort measures Outcome: Progressing   Problem: Safety: Goal: Ability to remain free from injury will improve Outcome: Progressing   Problem: Skin Integrity: Goal: Risk for impaired skin integrity will decrease Outcome: Progressing

## 2024-07-04 NOTE — Progress Notes (Addendum)
 [facility]                                                                              Tina Riley, is a 89 y.o. 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  Lives alone at home. At baseline, uses a walker, no recent falls. UA positive for UTI  Assessment & Plan    UTI with acute urinary retention - Follow urine culture and sensitivities, continue IV Rocephin  - Continue Foley catheter, placed Flomax  7 days   Generalized debility - PT recommending home health PT however nephew at admission in ED was uncomfortable with patient at home due to weakness, UTI, Foley catheter     Asthma -Currently no acute wheezing - Continue albuterol  nebs as needed, Breztri   Acute diastolic CHF - On exam was noted to have bibasilar crackles, CTA chest showed no PE, no infiltrates.  Trace interstitial pulmonary edema, moderate multivessel coronary artery calcification - Follow 2D echo, no prior echo - TSH 1.9 - BNP 1721, will give 1 dose of Lasix  20 mg x 1     GERD (gastroesophageal reflux disease) -Continue PPI    Protein-calorie malnutrition, weakness, FTT, FALL RISK - PT evaluation recommending HHPT.  I discussed at length with patient's nephew on the phone.  He states that patient lives at home alone, is very weak, has a rolling walker.  The person assisting her 2 hours a day Monday-Thursday is not a certified home health aide.  He checked with them yesterday and they are not able to provide her care with Foley catheter or voiding trial.  He feels uncomfortable  with her being at home alone with fall risk.  He prefers SNF for at least couple of weeks. Estimated body mass index is 21.77 kg/m as calculated from the following:   Height as of this encounter: 4' 11 (1.499 m).   Weight as of this encounter: 48.9 kg.  Code Status: DNR DVT Prophylaxis:  heparin  injection 5,000 Units Start: 07/03/24 2200   Level of Care: Level of care: Telemetry Family Communication: Updated patient's nephew on phone  Disposition Plan:      Remains inpatient appropriate:   TBD   Procedures:  CTA chest  Consultants:     Antimicrobials:   Anti-infectives (From admission, onward)    Start     Dose/Rate Route Frequency Ordered Stop   07/04/24 1530  cefTRIAXone  (ROCEPHIN ) 1 g in sodium chloride  0.9 % 100 mL IVPB        1 g 200 mL/hr over 30 Minutes Intravenous Every 24 hours 07/03/24 1720     07/04/24 1000  valACYclovir  (VALTREX ) tablet 1,000 mg        1,000 mg Oral Daily 07/03/24 1952  07/03/24 1515  cefTRIAXone  (ROCEPHIN ) 1 g in sodium chloride  0.9 % 100 mL IVPB        1 g 200 mL/hr over 30 Minutes Intravenous  Once 07/03/24 1505 07/03/24 1604   07/03/24 0000  cefpodoxime  (VANTIN ) 100 MG tablet        100 mg Oral Daily 07/03/24 1516 07/08/24 2359          Medications  budesonide -glycopyrrolate -formoterol   2 puff Inhalation BID   cetirizine   10 mg Oral Daily   Chlorhexidine  Gluconate Cloth  6 each Topical Q0600   gabapentin   300 mg Oral QHS   heparin   5,000 Units Subcutaneous Q8H   pantoprazole   40 mg Oral BID   polyethylene glycol  17 g Oral Daily   propranolol  ER  60 mg Oral Daily   rosuvastatin   5 mg Oral QHS   valACYclovir   1,000 mg Oral Daily      Subjective:   Tina Riley was seen and examined today.  No acute complaints.  No acute chest pain, feels very weak.  No acute SOB.  Abdominal discomfort improving.  No acute nausea or vomiting or diarrhea.  No acute events overnight.    Objective:   Vitals:   07/03/24 2147 07/04/24 0102  07/04/24 0437 07/04/24 0817  BP: (!) 158/67 (!) 183/80  (!) 141/64  Pulse: (!) 59 68  72  Resp: 14   16  Temp: (!) 97.4 F (36.3 C)   97.8 F (36.6 C)  TempSrc: Oral     SpO2: 98%   97%  Weight:   48.9 kg   Height:       No intake or output data in the 24 hours ending 07/04/24 0930   Wt Readings from Last 3 Encounters:  07/04/24 48.9 kg  05/15/24 49 kg  04/16/24 49 kg     Exam General: Alert and oriented x 3, ill-appearing, generalized debility Cardiovascular: S1 S2 auscultated,  RRR Respiratory: Bibasilar crackles Gastrointestinal: Soft, mild diffuse TTP, ND, NBS Ext: no pedal edema bilaterally Neuro: No new deficits Psych: Normal affect     Data Reviewed:  I have personally reviewed following labs    CBC Lab Results  Component Value Date   WBC 8.8 07/04/2024   RBC 3.62 (L) 07/04/2024   HGB 13.4 07/04/2024   HCT 38.8 07/04/2024   MCV 107.2 (H) 07/04/2024   MCH 37.0 (H) 07/04/2024   PLT 208 07/04/2024   MCHC 34.5 07/04/2024   RDW 12.6 07/04/2024   LYMPHSABS 1.9 07/03/2024   MONOABS 0.6 07/03/2024   EOSABS 0.1 07/03/2024   BASOSABS 0.1 07/03/2024     Last metabolic panel Lab Results  Component Value Date   NA 137 07/04/2024   K 3.9 07/04/2024   CL 102 07/04/2024   CO2 23 07/04/2024   BUN 16 07/04/2024   CREATININE 0.77 07/04/2024   GLUCOSE 127 (H) 07/04/2024   GFRNONAA >60 07/04/2024   GFRAA 70 04/09/2020   CALCIUM  9.1 07/04/2024   PROT 6.5 07/04/2024   ALBUMIN 3.9 07/04/2024   BILITOT 0.3 07/04/2024   ALKPHOS 111 07/04/2024   AST 14 (L) 07/04/2024   ALT 6 07/04/2024   ANIONGAP 13 07/04/2024    CBG (last 3)  No results for input(s): GLUCAP in the last 72 hours.    Coagulation Profile: No results for input(s): INR, PROTIME in the last 168 hours.   Radiology Studies: I have personally reviewed the imaging studies  CT Angio Chest PE W and/or Wo Contrast  Result Date: 07/04/2024 EXAM: CTA CHEST PE WITHOUT AND WITH CONTRAST CT  ABDOMEN AND PELVIS WITHOUT AND WITH CONTRAST 07/04/2024 02:28:11 AM TECHNIQUE: CTA of the chest was performed after the administration of 50 mL of iohexol  (OMNIPAQUE ) 350 MG/ML injection. Multiplanar reformatted images are provided for review. MIP images are provided for review. CT of the abdomen and pelvis was performed without and with the administration of 50 mL of iohexol  (OMNIPAQUE ) 350 MG/ML injection. Automated exposure control, iterative reconstruction, and/or weight based adjustment of the mA/kV was utilized to reduce the radiation dose to as low as reasonably achievable. COMPARISON: 03/09/2024 CLINICAL HISTORY: Pulmonary embolism, acute non-localized abdominal pain. FINDINGS: CHEST: PULMONARY ARTERIES: Pulmonary arteries are adequately opacified for evaluation. No intraluminal filling defect to suggest pulmonary embolism. Main pulmonary artery is normal in caliber. MEDIASTINUM: Moderate multivessel coronary artery calcification. Global cardiac size within normal limits. No pericardial effusion. Mild atherosclerotic calcification within the thoracic aorta. No aortic aneurysm. No pathologic thoracic adenopathy. The esophagus is unremarkable. Small hiatal hernia. Visualized thyroid  is unremarkable. LUNGS AND PLEURA: Trace interstitial pulmonary edema. Milk of fluid over the infiltrate. No pneumothorax or pleural effusion. SOFT TISSUES AND BONES: Osseous structures are diffusely osteopenic. Accentuated thoracic kyphosis. No acute fracture. ABDOMEN AND PELVIS: LIVER: Stable scattered cysts throughout the liver. GALLBLADDER AND BILE DUCTS: Status post cholecystectomy. Mild dilation of the biliary tree likely reflects post cholecystectomy change. Correlation with liver enzymes may be helpful to exclude an obstructive or inflammatory process. SPLEEN: Spleen demonstrates no acute abnormality. PANCREAS: Pancreas demonstrates no acute abnormality. ADRENAL GLANDS: Adrenal glands demonstrate no acute abnormality.  KIDNEYS, URETERS AND BLADDER: Simple cyst within the kidneys bilaterally for which no follow up imaging is recommended. No stones in the kidneys or ureters. No hydronephrosis. No perinephric or periureteral stranding. Foley catheter balloon seen within a largely decompressed lateral lumen. GI AND BOWEL: Severe sigmoid diverticulosis without superimposed focal inflammatory change. Status post right hemicolectomy. No obstruction. The stomach, small bowel, and large bowel are otherwise unremarkable. REPRODUCTIVE: Uterus absent. No adnexal masses. PERITONEUM AND RETROPERITONEUM: No ascites or free air. LYMPH NODES: No lymphadenopathy. BONES AND SOFT TISSUES: Status post left hip bipolar hemi arthroplasty. Osseous structures are diffusely osteopenic. Osseous structures are age appropriate. No acute bone abnormality. No lytic or blastic bone lesion. Moderate aortoiliac atherosclerotic calcification. No focal soft tissue abnormality. IMPRESSION: 1. No pulmonary embolism. 2. Severe sigmoid diverticulosis without superimposed focal inflammatory change. No obstruction. 3. Mild dilation of the biliary tree, likely post cholecystectomy change. Correlation with liver enzymes may be helpful to exclude an obstructive or inflammatory process. 4. Trace interstitial pulmonary edema. 5. Moderate multivessel coronary artery calcification 6. Small hiatal hernia. 7. Status post cholecystectomy, right hemicolectomy, hysterectomy, and left hip bipolar hemiarthroplasty. Electronically signed by: Dorethia Molt MD 07/04/2024 02:43 AM EST RP Workstation: [id number]   CT ABDOMEN PELVIS W CONTRAST Result Date: 07/04/2024 EXAM: CTA CHEST PE WITHOUT AND WITH CONTRAST CT ABDOMEN AND PELVIS WITHOUT AND WITH CONTRAST 07/04/2024 02:28:11 AM TECHNIQUE: CTA of the chest was performed after the administration of 50 mL of iohexol  (OMNIPAQUE ) 350 MG/ML injection. Multiplanar reformatted images are provided for review. MIP images are provided for  review. CT of the abdomen and pelvis was performed without and with the administration of 50 mL of iohexol  (OMNIPAQUE ) 350 MG/ML injection. Automated exposure control, iterative reconstruction, and/or weight based adjustment of the mA/kV was utilized to reduce the radiation dose to as low as reasonably achievable. COMPARISON: 03/09/2024 CLINICAL HISTORY: Pulmonary embolism, acute non-localized abdominal  pain. FINDINGS: CHEST: PULMONARY ARTERIES: Pulmonary arteries are adequately opacified for evaluation. No intraluminal filling defect to suggest pulmonary embolism. Main pulmonary artery is normal in caliber. MEDIASTINUM: Moderate multivessel coronary artery calcification. Global cardiac size within normal limits. No pericardial effusion. Mild atherosclerotic calcification within the thoracic aorta. No aortic aneurysm. No pathologic thoracic adenopathy. The esophagus is unremarkable. Small hiatal hernia. Visualized thyroid  is unremarkable. LUNGS AND PLEURA: Trace interstitial pulmonary edema. Milk of fluid over the infiltrate. No pneumothorax or pleural effusion. SOFT TISSUES AND BONES: Osseous structures are diffusely osteopenic. Accentuated thoracic kyphosis. No acute fracture. ABDOMEN AND PELVIS: LIVER: Stable scattered cysts throughout the liver. GALLBLADDER AND BILE DUCTS: Status post cholecystectomy. Mild dilation of the biliary tree likely reflects post cholecystectomy change. Correlation with liver enzymes may be helpful to exclude an obstructive or inflammatory process. SPLEEN: Spleen demonstrates no acute abnormality. PANCREAS: Pancreas demonstrates no acute abnormality. ADRENAL GLANDS: Adrenal glands demonstrate no acute abnormality. KIDNEYS, URETERS AND BLADDER: Simple cyst within the kidneys bilaterally for which no follow up imaging is recommended. No stones in the kidneys or ureters. No hydronephrosis. No perinephric or periureteral stranding. Foley catheter balloon seen within a largely decompressed  lateral lumen. GI AND BOWEL: Severe sigmoid diverticulosis without superimposed focal inflammatory change. Status post right hemicolectomy. No obstruction. The stomach, small bowel, and large bowel are otherwise unremarkable. REPRODUCTIVE: Uterus absent. No adnexal masses. PERITONEUM AND RETROPERITONEUM: No ascites or free air. LYMPH NODES: No lymphadenopathy. BONES AND SOFT TISSUES: Status post left hip bipolar hemi arthroplasty. Osseous structures are diffusely osteopenic. Osseous structures are age appropriate. No acute bone abnormality. No lytic or blastic bone lesion. Moderate aortoiliac atherosclerotic calcification. No focal soft tissue abnormality. IMPRESSION: 1. No pulmonary embolism. 2. Severe sigmoid diverticulosis without superimposed focal inflammatory change. No obstruction. 3. Mild dilation of the biliary tree, likely post cholecystectomy change. Correlation with liver enzymes may be helpful to exclude an obstructive or inflammatory process. 4. Trace interstitial pulmonary edema. 5. Moderate multivessel coronary artery calcification 6. Small hiatal hernia. 7. Status post cholecystectomy, right hemicolectomy, hysterectomy, and left hip bipolar hemiarthroplasty. Electronically signed by: Dorethia Molt MD 07/04/2024 02:43 AM EST RP Workstation: [id number]   DG Chest 2 View Result Date: 07/03/2024 EXAM: 2 VIEW(S) XRAY OF THE CHEST 07/03/2024 06:19:00 PM COMPARISON: 03/09/2024 CLINICAL HISTORY: cough/ crackles posterioir bases. FINDINGS: LINES, TUBES AND DEVICES: Electronic device projecting over the left chest. LUNGS AND PLEURA: Shallow inspiration. No airspace disease or consolidation in the lungs. Atelectasis in the lung bases. No pleural effusion or pneumothorax. HEART AND MEDIASTINUM: Heart size and pulmonary vascularity are normal. Mediastinal contours appear intact. Calcification of the aorta. BONES AND SOFT TISSUES: Degenerative changes in the spine with mid-thoracic vertebral compression  deformities resulting in kyphosis. Calcification of the aorta. IMPRESSION: 1. Atelectasis in the lung bases. 2. No acute process. Electronically signed by: Elsie Gravely MD 07/03/2024 06:29 PM EST RP Workstation: [id number]       Nydia Distance M.D. [facility] 07/04/2024, 9:30 AM  Available via Epic secure chat 7am-7pm After 7 pm, please refer to night coverage provider listed on amion.

## 2024-07-04 NOTE — Evaluation (Signed)
 Physical Therapy Evaluation Patient Details Name: Tina Riley MRN: [id number] DOB: [name] 09, 1932 Today's Date: 07/04/2024  History of Present Illness  89 y.o. female presents 12/16 with abdominal discomfort and inability to void since previous night. Suspected UTI and possible underlying constipation. PMH: HSV, allergic rhinitis, asthma, GERD, dysphagia, pulmonary nodule, protein calorie malnutrition, history of recurrent UTI, urinary retention, HTN  Clinical Impression  PTA, pt lives alone, uses a RW for ambulation and is independent with ADL's. Pt has an aide 4 days/wk, 2 hours/day to assist with IADL's. Pt appears to be close to her functional baseline. She demonstrates ability to perform bed mobility with HOB flat, transfers and ambulate a household distance with a RW without physical assist. HR 90's. It appears that pt main barrier to d/c is her ability to independently manage her Foley bag. May benefit from increased [facility] aide coverage upon d/c home. Recommend HHPT to address balance, strengthening and functional mobility.       If plan is discharge home, recommend the following: Assistance with cooking/housework;Assist for transportation   Can travel by private vehicle        Equipment Recommendations None recommended by PT  Recommendations for Other Services       Functional Status Assessment Patient has had a recent decline in their functional status and demonstrates the ability to make significant improvements in function in a reasonable and predictable amount of time.     Precautions / Restrictions Precautions Precautions: Fall Restrictions Weight Bearing Restrictions Per Provider Order: No      Mobility  Bed Mobility Overal bed mobility: Modified Independent             General bed mobility comments: HOB flat, no rails, increased time    Transfers Overall transfer level: Modified independent Equipment used: Rolling walker (2 wheels)                     Ambulation/Gait Ambulation/Gait assistance: Supervision Gait Distance (Feet): 120 Feet Assistive device: Rolling walker (2 wheels) Gait Pattern/deviations: Step-through pattern, Decreased stride length Gait velocity: decreased Gait velocity interpretation: <1.8 ft/sec, indicate of risk for recurrent falls   General Gait Details: Cues for walker proximity  Stairs            Wheelchair Mobility     Tilt Bed    Modified Rankin (Stroke Patients Only)       Balance Overall balance assessment: Mild deficits observed, not formally tested                                           Pertinent Vitals/Pain Pain Assessment Pain Assessment: Faces Faces Pain Scale: Hurts little more Pain Location: mid to low back Pain Descriptors / Indicators: Throbbing, Aching Pain Intervention(s): Monitored during session    Home Living Family/patient expects to be discharged to:: Private residence Living Arrangements: Alone Available Help at Discharge: Family;Personal care attendant;Available PRN/intermittently Type of Home: Apartment Home Access: Ramped entrance       Home Layout: One level Home Equipment: Rolling Walker (2 wheels);Grab bars - toilet;Grab bars - tub/shower      Prior Function Prior Level of Function : Independent/Modified Independent             Mobility Comments: Pt using RW, reports one assisted fall within past 3 months. ADLs Comments: Independent ADL's and medication management using pill box. Pt nephew manages her  finances. Pt has an aide who comes M-Th,2 hours/day to assist with IADL's. Does not drive     Extremity/Trunk Assessment   Upper Extremity Assessment Upper Extremity Assessment: Defer to OT evaluation    Lower Extremity Assessment Lower Extremity Assessment: Overall WFL for tasks assessed    Cervical / Trunk Assessment Cervical / Trunk Assessment: Kyphotic  Communication   Communication Communication: No  apparent difficulties    Cognition Arousal: Alert Behavior During Therapy: WFL for tasks assessed/performed   PT - Cognitive impairments: No family/caregiver present to determine baseline                       PT - Cognition Comments: Pt reports feeling disoriented this AM; as she has had little sleep and was woken up at 3 AM for CT scan. She initially thought she was at a mall at 3 AM, but then realized she was at the hospital. Pt A&O x4 currently, not oriented to day of week. Following commands: Intact       Cueing Cueing Techniques: Verbal cues     General Comments      Exercises     Assessment/Plan    PT Assessment Patient needs continued PT services  PT Problem List Decreased balance;Decreased mobility       PT Treatment Interventions DME instruction;Gait training;Functional mobility training;Therapeutic exercise;Therapeutic activities;Balance training;Patient/family education    PT Goals (Current goals can be found in the Care Plan section)  Acute Rehab PT Goals Patient Stated Goal: to have more help at home PT Goal Formulation: With patient Time For Goal Achievement: 07/18/24 Potential to Achieve Goals: Good    Frequency Min 1X/week     Co-evaluation               AM-PAC PT 6 Clicks Mobility  Outcome Measure Help needed turning from your back to your side while in a flat bed without using bedrails?: None Help needed moving from lying on your back to sitting on the side of a flat bed without using bedrails?: None Help needed moving to and from a bed to a chair (including a wheelchair)?: None Help needed standing up from a chair using your arms (e.g., wheelchair or bedside chair)?: None Help needed to walk in hospital room?: A Little Help needed climbing 3-5 steps with a railing? : A Little 6 Click Score: 22    End of Session Equipment Utilized During Treatment: Gait belt Activity Tolerance: Patient tolerated treatment well Patient left:  in chair;with call bell/phone within reach;with chair alarm set   PT Visit Diagnosis: Unsteadiness on feet (R26.81)    Time: 9186-9158 PT Time Calculation (min) (ACUTE ONLY): 28 min   Charges:   PT Evaluation $PT Eval Low Complexity: 1 Low PT Treatments $Therapeutic Activity: 8-22 mins PT General Charges $$ ACUTE PT VISIT: 1 Visit         Aleck Daring, PT, DPT Acute Rehabilitation Services Office [phone]   Aleck ONEIDA Daring 07/04/2024, 8:49 AM

## 2024-07-05 ENCOUNTER — Other Ambulatory Visit (HOSPITAL_COMMUNITY): Payer: Self-pay

## 2024-07-05 ENCOUNTER — Other Ambulatory Visit: Payer: Self-pay | Admitting: Internal Medicine

## 2024-07-05 ENCOUNTER — Other Ambulatory Visit (HOSPITAL_BASED_OUTPATIENT_CLINIC_OR_DEPARTMENT_OTHER): Payer: Self-pay

## 2024-07-05 DIAGNOSIS — N3001 Acute cystitis with hematuria: Secondary | ICD-10-CM | POA: Diagnosis not present

## 2024-07-05 DIAGNOSIS — R5381 Other malaise: Secondary | ICD-10-CM | POA: Diagnosis not present

## 2024-07-05 DIAGNOSIS — R339 Retention of urine, unspecified: Secondary | ICD-10-CM | POA: Diagnosis not present

## 2024-07-05 DIAGNOSIS — J4540 Moderate persistent asthma, uncomplicated: Secondary | ICD-10-CM

## 2024-07-05 LAB — URINALYSIS, W/ REFLEX TO CULTURE (INFECTION SUSPECTED)
Bacteria, UA: NONE SEEN
Bilirubin Urine: NEGATIVE
Glucose, UA: NEGATIVE mg/dL
Hgb urine dipstick: NEGATIVE
Ketones, ur: 5 mg/dL — AB
Nitrite: NEGATIVE
Protein, ur: NEGATIVE mg/dL
Specific Gravity, Urine: 1.023 (ref 1.005–1.030)
pH: 5 (ref 5.0–8.0)

## 2024-07-05 MED ORDER — TAMSULOSIN HCL 0.4 MG PO CAPS
0.4000 mg | ORAL_CAPSULE | Freq: Every day | ORAL | 0 refills | Status: DC
  Filled 2024-07-05: qty 7, 7d supply, fill #0

## 2024-07-05 MED ORDER — CEFUROXIME AXETIL 250 MG PO TABS
250.0000 mg | ORAL_TABLET | Freq: Once | ORAL | Status: AC
  Administered 2024-07-05: 15:00:00 250 mg via ORAL
  Filled 2024-07-05: qty 1

## 2024-07-05 MED ORDER — CEFPODOXIME PROXETIL 100 MG/5ML PO SUSR
100.0000 mg | Freq: Once | ORAL | Status: DC
  Filled 2024-07-05: qty 5

## 2024-07-05 MED ORDER — FUROSEMIDE 20 MG PO TABS
20.0000 mg | ORAL_TABLET | Freq: Every day | ORAL | Status: DC
  Administered 2024-07-05: 12:00:00 20 mg via ORAL
  Filled 2024-07-05: qty 1

## 2024-07-05 MED ORDER — CEFUROXIME AXETIL 250 MG PO TABS
250.0000 mg | ORAL_TABLET | Freq: Two times a day (BID) | ORAL | 0 refills | Status: AC
  Filled 2024-07-05: qty 4, 2d supply, fill #0

## 2024-07-05 MED ORDER — PANTOPRAZOLE SODIUM 40 MG PO TBEC
40.0000 mg | DELAYED_RELEASE_TABLET | Freq: Two times a day (BID) | ORAL | Status: DC
  Administered 2024-07-05 (×2): 40 mg via ORAL
  Filled 2024-07-05 (×2): qty 1

## 2024-07-05 NOTE — TOC Transition Note (Addendum)
 Transition of Care [facility]) - Discharge Note   Patient Details  Name: Tina Riley MRN: [id number] Date of Birth: [birth year]  Transition of Care [facility]) CM/SW Contact:  Rosalva Jon Bloch, RN Phone Number: 07/05/2024, 1:40 PM   Clinical Narrative:    Patient will DC to: home Anticipated DC date: 07/05/2024 Family notified: yes Transport by: car  Per MD patient ready for DC today with foley catheter (urinary retention/ urology f/u inplace).. RN, patient, and patient's nephew. Pt with caregiver to assist with care once home, M-F. Pt with noted DME and home health needs. Pt agreeable to home health services. Preference : [facility]. [facility] made aware and accepted.   Referral made with [facility] for bsc. Equipment will be delivered to bedside prior to d/c. Post hospital f/u noted on AVS. Pt without RX med concerns. Nephew to provide transportation to home.  RNCM will sign off for now as intervention is no longer needed. Please consult us  again if new needs arise.    Final next level of care: Home w Home Health Services Barriers to Discharge: No Barriers Identified   Patient Goals and CMS Choice     Choice offered to / list presented to : Adult Children      Discharge Placement                       Discharge Plan and Services Additional resources added to the After Visit Summary for                  DME Arranged: Bedside commode DME Agency: [facility] Date DME Agency Contacted: 07/05/24 Time DME Agency Contacted: 1339 Representative spoke with at DME Agency: Darlyn HH Arranged: PT, OT, RN, Nurse Aide [facility] Agency: [facility] Date [facility] Agency Contacted: 07/05/24 Time HH Agency Contacted: [phone] Representative spoke with at [facility] Agency: Darleene  Social Drivers of Health (SDOH) Interventions SDOH Screenings   Food Insecurity: No Food Insecurity (07/04/2024)  Housing: Low Risk (07/04/2024)  Transportation Needs: No Transportation Needs  (07/04/2024)  Utilities: Not At Risk (07/04/2024)  Alcohol  Screen: Low Risk (05/15/2024)  Depression (PHQ2-9): Medium Risk (05/15/2024)  Financial Resource Strain: Low Risk (05/15/2024)  Physical Activity: Inactive (05/15/2024)  Social Connections: Socially Isolated (07/04/2024)  Stress: No Stress Concern Present (05/15/2024)  Tobacco Use: Low Risk (06/12/2024)  Health Literacy: Adequate Health Literacy (05/15/2024)     Readmission Risk Interventions     No data to display

## 2024-07-05 NOTE — Plan of Care (Signed)

## 2024-07-05 NOTE — Care Management Obs Status (Signed)
 MEDICARE OBSERVATION STATUS NOTIFICATION   Patient Details  Name: Tina Riley MRN: [id number] Date of Birth: 26-[name]-1932   Medicare Observation Status Notification Given:  Yes    Jennie Laneta Dragon 07/05/2024, 12:06 PM

## 2024-07-05 NOTE — Progress Notes (Signed)
 VAST consult received to obtain IV access for last dose of abx prior to discharge. Upon arrival at patient's bedside, she verbalized she does not want to be stuck again. VAST RN contacted physician who changed order to PO abx with observation by unit RN.

## 2024-07-05 NOTE — Progress Notes (Signed)
°  °  Durable Medical Equipment  (From admission, onward)           Start     Ordered   07/05/24 1237  For home use only DME Bedside commode  Once       Comments: Confine to one room  Question:  Patient needs a bedside commode to treat with the following condition  Answer:  Gait instability   07/05/24 1236

## 2024-07-05 NOTE — Discharge Summary (Addendum)
 Physician Discharge Summary   Patient: Tina Riley MRN: [id number] DOB: May 09, 1932  Admit date:     07/03/2024  Discharge date: 07/05/2024  Discharge Physician: Nydia Distance, MD     PCP: [name], MD   Recommendations at discharge:   Continue Foley catheter.  Placed on Flomax  0.4 mg daily for 7 days Outpatient voiding trial and follow-up with urology office next week Ceftin  250 mg twice daily for 2 more days to complete course for UTI  Discharge Diagnoses:  Urinary retention Generalized debility   Asthma   GERD (gastroesophageal reflux disease)   Protein-calorie malnutrition   UTI (urinary tract infection) Mild acute diastolic CHF    Hospital Course:  Patient is a 89 year old female with HSV, allergic rhinitis, asthma, GERD, dysphagia, pulmonary nodule, protein calorie malnutrition, history of recurrent UTI, urinary retention, HTN,DM,  presented with abdominal discomfort.  Lives alone at home. At baseline, uses a walker, no recent falls. UA positive for UTI   Assessment and Plan:  UTI with acute urinary retention - Patient was placed on IV Rocephin , day #3 today.  Urine culture still pending.  Patient now feels back to baseline. -Will continue to follow urine culture if any antibiotic change needed -  DC home on ceftin  for 2 more days to complete full course.  - Continue Foley catheter, placed on Flomax  7 days.  Outpatient follow-up with urology for voiding trial and further management.     Generalized debility - PT recommending home health PT however nephew at admission in ED was uncomfortable with patient at home due to weakness, UTI, Foley catheter -Discussed with patient this morning, she preferred to go home and has caregiver.       Asthma -Currently no acute wheezing - Continue albuterol  nebs as needed, Breztri    Mild acute diastolic CHF - On admission exam was noted to have bibasilar crackles, CTA chest showed no PE, no infiltrates.   Trace interstitial pulmonary edema, moderate multivessel coronary artery calcification - TSH 1.9 - BNP 1721, received Lasix  20 mg IV.  Negative balance of 1.1 L.  Weight down from 110.23 on admission-> 107 today. - 2D echo showed EF of 77 5% with G1 DD, normal right ventricular systolic function, no evidence of mitral stenosis.  No aortic stenosis. -Does not need daily Lasix .  Outpatient follow-up with PCP, if needed can be placed on as needed Lasix .       GERD (gastroesophageal reflux disease) -Continue PPI     Protein-calorie malnutrition, weakness, - PT evaluation recommended home health PT OT -Discussed in detail with patient's nephew on the phone, patient prefers to go home - Home health, DME arranged   Estimated body mass index is 21.77 kg/m as calculated from the following:   Height as of this encounter: 4' 11 (1.499 m).   Weight as of this encounter: 48.9 kg.      Pain control - [place]  Controlled Substance Reporting System database was reviewed. and patient was instructed, not to drive, operate heavy machinery, perform activities at heights, swimming or participation in water activities or provide baby-sitting services while on Pain, Sleep and Anxiety Medications; until their outpatient Physician has advised to do so again. Also recommended to not to take more than prescribed Pain, Sleep and Anxiety Medications.  Consultants: None Procedures performed: None Disposition: Home health Diet recommendation: Soft diet  DISCHARGE MEDICATION: Allergies as of 07/05/2024       Reactions   Cephalexin Palpitations   Ciprofloxacin Palpitations  Meperidine Hcl Other (See Comments)   Doesn't work   Sulfa Antibiotics Nausea And Vomiting   Cefdinir  Nausea Only   Doxycycline Nausea Only        Medication List     TAKE these medications    Acetaminophen  Extra Strength 500 MG Tabs Take 1 tablet (500 mg total) by mouth daily as needed for headache or mild pain. What  changed:  when to take this reasons to take this   albuterol  108 (90 Base) MCG/ACT inhaler Commonly known as: VENTOLIN  HFA Inhale 2 puffs into the lungs every 6 (six) hours as needed for shortness of breath or wheezing.   cefUROXime  250 MG tablet Commonly known as: CEFTIN  Take 1 tablet (250 mg total) by mouth 2 (two) times daily for 2 days. Start taking on: July 06, 2024   clonazePAM  0.5 MG tablet Commonly known as: KLONOPIN  Take 1 tablet (0.5 mg total) by mouth 2 (two) times daily as needed for anxiety. What changed: when to take this   dicyclomine  10 MG capsule Commonly known as: BENTYL  Take 1 capsule (10 mg total) by mouth 2 (two) times daily as needed for spasms.   erythromycin  ophthalmic ointment Apply a small amount to both eyes at bedtime.   gabapentin  300 MG capsule Commonly known as: NEURONTIN  Take 1 capsule (300 mg total) by mouth at bedtime.   levocetirizine 5 MG tablet Commonly known as: XYZAL  Take 1 tablet (5 mg total) by mouth daily. What changed: when to take this   Linzess  145 MCG Caps capsule Generic drug: linaclotide  Take 1 capsule (145 mcg total) by mouth daily before breakfast.   ondansetron  4 MG disintegrating tablet Commonly known as: ZOFRAN -ODT Dissolve 1 tablet under the tongue every 6 (six) hours as needed for nausea. What changed: reasons to take this   pantoprazole  40 MG tablet Commonly known as: PROTONIX  Take 1 tablet (40 mg total) by mouth 2 (two) times daily.   propranolol  ER 60 MG 24 hr capsule Commonly known as: INDERAL  LA Take 1 capsule (60 mg total) by mouth daily.   rosuvastatin  5 MG tablet Commonly known as: CRESTOR  Take 1 tablet (5 mg total) by mouth at bedtime.   SYSTANE OP Place 1 drop into both eyes daily as needed (dry eyes).   tamsulosin  0.4 MG Caps capsule Commonly known as: FLOMAX  Take 1 capsule (0.4 mg total) by mouth daily for 7 days. Start taking on: July 06, 2024   Trelegy Ellipta  100-62.5-25  MCG/ACT Aepb Generic drug: Fluticasone -Umeclidin-Vilant Inhale 1 puff into the lungs daily.   triamcinolone  cream 0.1 % Commonly known as: KENALOG  Apply 1 Application topically 2 (two) times daily.   valACYclovir  500 MG tablet Commonly known as: VALTREX  Take 2 tablets (1,000 mg total) by mouth daily. What changed: when to take this               Durable Medical Equipment  (From admission, onward)           Start     Ordered   07/05/24 1237  For home use only DME Bedside commode  Once       Comments: Confine to one room  Question:  Patient needs a bedside commode to treat with the following condition  Answer:  Gait instability   07/05/24 1236            Contact information for follow-up providers     Carolee Sherwood JONETTA DOUGLAS, MD. Schedule an appointment as soon as possible for a visit  in 1 week(s).   Specialty: Urology Why: for hospital follow-up, Foley and voiding trial. Contact information: [address] [place] [address] [phone]         [name], MD. Schedule an appointment as soon as possible for a visit .   Specialty: Internal Medicine Contact information: [address] [place] [address] [phone]              Contact information for after-discharge care     Home Medical Care     [facility]) .   Service: Home Health Services Why: home health services will be provided by [facility] information: [address] [place]  [address] [phone]                    Discharge Exam: Tina Riley   07/03/24 1016 07/04/24 0437 07/05/24 0357  Weight: 50 kg 48.9 kg 48.8 kg   S: Feels a lot better today, no acute nausea vomiting, chest pain, shortness breath or any fevers.  BP 122/60   Pulse 76   Temp 97.7 F (36.5 C)   Resp 18   Ht 4' 11 (1.499 m)   Wt 48.8 kg   SpO2 99%   BMI 21.73 kg/m   Physical Exam General: Alert and  oriented x 3, NAD Cardiovascular: S1 S2 clear, RRR.  Respiratory: CTAB, no wheezing, rales or rhonchi Gastrointestinal: Soft, nontender, nondistended, NBS Ext: no pedal edema bilaterally Neuro: no new deficits Psych: Normal affect    Condition at discharge: fair  The results of significant diagnostics from this hospitalization (including imaging, microbiology, ancillary and laboratory) are listed below for reference.   Imaging Studies: ECHOCARDIOGRAM COMPLETE Result Date: 07/04/2024    ECHOCARDIOGRAM REPORT   Patient Name:   Tina Riley Date of Exam: 07/04/2024 Medical Rec #:  [id number]            Height:       59.0 in Accession #:    [id number]           Weight:       107.8 lb Date of Birth:  07/23/1931            BSA:          1.418 m Patient Age:    89 years             BP:           141/64 mmHg Patient Gender: F                    HR:           79 bpm. Exam Location:  Inpatient Procedure: 2D Echo (Both Spectral and Color Flow Doppler were utilized during            procedure). Indications:    acute diastolic chf  History:        Patient has no prior history of Echocardiogram examinations.                 Risk Factors:Dyslipidemia and Hypertension.  Sonographer:    Tinnie Barefoot RDCS Referring Phys: [id number] Gaelen Brager IMPRESSIONS  1. Left ventricular ejection fraction, by estimation, is 70 to 75%. The left ventricle has hyperdynamic function. The left ventricle has no regional wall motion abnormalities. Left ventricular diastolic parameters are consistent with Grade I diastolic dysfunction (impaired relaxation).  2. Right ventricular systolic function is  normal. The right ventricular size is mildly enlarged. There is normal pulmonary artery systolic pressure.  3. The mitral valve is normal in structure. Trivial mitral valve regurgitation. No evidence of mitral stenosis.  4. The aortic valve is tricuspid. There is mild calcification of the aortic valve. Aortic valve regurgitation is  not visualized. Aortic valve sclerosis/calcification is present, without any evidence of aortic stenosis.  5. The inferior vena cava is normal in size with greater than 50% respiratory variability, suggesting right atrial pressure of 3 mmHg. FINDINGS  Left Ventricle: Left ventricular ejection fraction, by estimation, is 70 to 75%. The left ventricle has hyperdynamic function. The left ventricle has no regional wall motion abnormalities. The left ventricular internal cavity size was normal in size. There is no left ventricular hypertrophy. Left ventricular diastolic parameters are consistent with Grade I diastolic dysfunction (impaired relaxation). Right Ventricle: The right ventricular size is mildly enlarged. No increase in right ventricular wall thickness. Right ventricular systolic function is normal. There is normal pulmonary artery systolic pressure. The tricuspid regurgitant velocity is 2.45  m/s, and with an assumed right atrial pressure of 3 mmHg, the estimated right ventricular systolic pressure is 27.0 mmHg. Left Atrium: Left atrial size was normal in size. Right Atrium: Right atrial size was normal in size. Pericardium: There is no evidence of pericardial effusion. Mitral Valve: The mitral valve is normal in structure. Mild mitral annular calcification. Trivial mitral valve regurgitation. No evidence of mitral valve stenosis. Tricuspid Valve: The tricuspid valve is normal in structure. Tricuspid valve regurgitation is trivial. No evidence of tricuspid stenosis. Aortic Valve: The aortic valve is tricuspid. There is mild calcification of the aortic valve. Aortic valve regurgitation is not visualized. Aortic valve sclerosis/calcification is present, without any evidence of aortic stenosis. Pulmonic Valve: The pulmonic valve was normal in structure. Pulmonic valve regurgitation is mild. No evidence of pulmonic stenosis. Aorta: The aortic root is normal in size and structure. Venous: The inferior vena cava is  normal in size with greater than 50% respiratory variability, suggesting right atrial pressure of 3 mmHg. IAS/Shunts: No atrial level shunt detected by color flow Doppler.  LEFT VENTRICLE PLAX 2D LVIDd:         3.30 cm     Diastology LVIDs:         2.00 cm     LV e' medial:  4.66 cm/s LV PW:         0.80 cm     LV e' lateral: 10.40 cm/s LV IVS:        0.90 cm LVOT diam:     1.60 cm LV SV:         41 LV SV Index:   29 LVOT Area:     2.01 cm LV IVRT:       183 msec  LV Volumes (MOD) LV vol d, MOD A2C: 17.1 ml LV vol d, MOD A4C: 25.1 ml LV vol s, MOD A2C: 7.0 ml LV vol s, MOD A4C: 11.0 ml LV SV MOD A2C:     10.1 ml LV SV MOD A4C:     25.1 ml LV SV MOD BP:      11.8 ml RIGHT VENTRICLE             IVC RV Basal diam:  2.20 cm     IVC diam: 1.40 cm RV S prime:     17.90 cm/s TAPSE (M-mode): 2.3 cm LEFT ATRIUM  Index        RIGHT ATRIUM          Index LA diam:        2.90 cm 2.04 cm/m   RA Area:     9.31 cm LA Vol (A2C):   23.5 ml 16.57 ml/m  RA Volume:   20.60 ml 14.52 ml/m LA Vol (A4C):   33.3 ml 23.48 ml/m LA Biplane Vol: 28.8 ml 20.31 ml/m  AORTIC VALVE             PULMONIC VALVE LVOT Vmax:   113.00 cm/s PR End Diast Vel: 5.57 msec LVOT Vmean:  70.300 cm/s LVOT VTI:    0.202 m  AORTA Ao Root diam: 2.90 cm Ao Asc diam:  2.90 cm TRICUSPID VALVE TR Peak grad:   24.0 mmHg TR Vmax:        245.00 cm/s  SHUNTS Systemic VTI:  0.20 m Systemic Diam: 1.60 cm Toribio Fuel MD Electronically signed by Toribio Fuel MD Signature Date/Time: 07/04/2024/8:33:12 PM    Final    CT Angio Chest PE W and/or Wo Contrast Result Date: 07/04/2024 EXAM: CTA CHEST PE WITHOUT AND WITH CONTRAST CT ABDOMEN AND PELVIS WITHOUT AND WITH CONTRAST 07/04/2024 02:28:11 AM TECHNIQUE: CTA of the chest was performed after the administration of 50 mL of iohexol  (OMNIPAQUE ) 350 MG/ML injection. Multiplanar reformatted images are provided for review. MIP images are provided for review. CT of the abdomen and pelvis was performed without  and with the administration of 50 mL of iohexol  (OMNIPAQUE ) 350 MG/ML injection. Automated exposure control, iterative reconstruction, and/or weight based adjustment of the mA/kV was utilized to reduce the radiation dose to as low as reasonably achievable. COMPARISON: 03/09/2024 CLINICAL HISTORY: Pulmonary embolism, acute non-localized abdominal pain. FINDINGS: CHEST: PULMONARY ARTERIES: Pulmonary arteries are adequately opacified for evaluation. No intraluminal filling defect to suggest pulmonary embolism. Main pulmonary artery is normal in caliber. MEDIASTINUM: Moderate multivessel coronary artery calcification. Global cardiac size within normal limits. No pericardial effusion. Mild atherosclerotic calcification within the thoracic aorta. No aortic aneurysm. No pathologic thoracic adenopathy. The esophagus is unremarkable. Small hiatal hernia. Visualized thyroid  is unremarkable. LUNGS AND PLEURA: Trace interstitial pulmonary edema. Milk of fluid over the infiltrate. No pneumothorax or pleural effusion. SOFT TISSUES AND BONES: Osseous structures are diffusely osteopenic. Accentuated thoracic kyphosis. No acute fracture. ABDOMEN AND PELVIS: LIVER: Stable scattered cysts throughout the liver. GALLBLADDER AND BILE DUCTS: Status post cholecystectomy. Mild dilation of the biliary tree likely reflects post cholecystectomy change. Correlation with liver enzymes may be helpful to exclude an obstructive or inflammatory process. SPLEEN: Spleen demonstrates no acute abnormality. PANCREAS: Pancreas demonstrates no acute abnormality. ADRENAL GLANDS: Adrenal glands demonstrate no acute abnormality. KIDNEYS, URETERS AND BLADDER: Simple cyst within the kidneys bilaterally for which no follow up imaging is recommended. No stones in the kidneys or ureters. No hydronephrosis. No perinephric or periureteral stranding. Foley catheter balloon seen within a largely decompressed lateral lumen. GI AND BOWEL: Severe sigmoid diverticulosis  without superimposed focal inflammatory change. Status post right hemicolectomy. No obstruction. The stomach, small bowel, and large bowel are otherwise unremarkable. REPRODUCTIVE: Uterus absent. No adnexal masses. PERITONEUM AND RETROPERITONEUM: No ascites or free air. LYMPH NODES: No lymphadenopathy. BONES AND SOFT TISSUES: Status post left hip bipolar hemi arthroplasty. Osseous structures are diffusely osteopenic. Osseous structures are age appropriate. No acute bone abnormality. No lytic or blastic bone lesion. Moderate aortoiliac atherosclerotic calcification. No focal soft tissue abnormality. IMPRESSION: 1. No pulmonary embolism. 2. Severe sigmoid diverticulosis  without superimposed focal inflammatory change. No obstruction. 3. Mild dilation of the biliary tree, likely post cholecystectomy change. Correlation with liver enzymes may be helpful to exclude an obstructive or inflammatory process. 4. Trace interstitial pulmonary edema. 5. Moderate multivessel coronary artery calcification 6. Small hiatal hernia. 7. Status post cholecystectomy, right hemicolectomy, hysterectomy, and left hip bipolar hemiarthroplasty. Electronically signed by: Dorethia Molt MD 07/04/2024 02:43 AM EST RP Workstation: [id number]   CT ABDOMEN PELVIS W CONTRAST Result Date: 07/04/2024 EXAM: CTA CHEST PE WITHOUT AND WITH CONTRAST CT ABDOMEN AND PELVIS WITHOUT AND WITH CONTRAST 07/04/2024 02:28:11 AM TECHNIQUE: CTA of the chest was performed after the administration of 50 mL of iohexol  (OMNIPAQUE ) 350 MG/ML injection. Multiplanar reformatted images are provided for review. MIP images are provided for review. CT of the abdomen and pelvis was performed without and with the administration of 50 mL of iohexol  (OMNIPAQUE ) 350 MG/ML injection. Automated exposure control, iterative reconstruction, and/or weight based adjustment of the mA/kV was utilized to reduce the radiation dose to as low as reasonably achievable. COMPARISON: 03/09/2024  CLINICAL HISTORY: Pulmonary embolism, acute non-localized abdominal pain. FINDINGS: CHEST: PULMONARY ARTERIES: Pulmonary arteries are adequately opacified for evaluation. No intraluminal filling defect to suggest pulmonary embolism. Main pulmonary artery is normal in caliber. MEDIASTINUM: Moderate multivessel coronary artery calcification. Global cardiac size within normal limits. No pericardial effusion. Mild atherosclerotic calcification within the thoracic aorta. No aortic aneurysm. No pathologic thoracic adenopathy. The esophagus is unremarkable. Small hiatal hernia. Visualized thyroid  is unremarkable. LUNGS AND PLEURA: Trace interstitial pulmonary edema. Milk of fluid over the infiltrate. No pneumothorax or pleural effusion. SOFT TISSUES AND BONES: Osseous structures are diffusely osteopenic. Accentuated thoracic kyphosis. No acute fracture. ABDOMEN AND PELVIS: LIVER: Stable scattered cysts throughout the liver. GALLBLADDER AND BILE DUCTS: Status post cholecystectomy. Mild dilation of the biliary tree likely reflects post cholecystectomy change. Correlation with liver enzymes may be helpful to exclude an obstructive or inflammatory process. SPLEEN: Spleen demonstrates no acute abnormality. PANCREAS: Pancreas demonstrates no acute abnormality. ADRENAL GLANDS: Adrenal glands demonstrate no acute abnormality. KIDNEYS, URETERS AND BLADDER: Simple cyst within the kidneys bilaterally for which no follow up imaging is recommended. No stones in the kidneys or ureters. No hydronephrosis. No perinephric or periureteral stranding. Foley catheter balloon seen within a largely decompressed lateral lumen. GI AND BOWEL: Severe sigmoid diverticulosis without superimposed focal inflammatory change. Status post right hemicolectomy. No obstruction. The stomach, small bowel, and large bowel are otherwise unremarkable. REPRODUCTIVE: Uterus absent. No adnexal masses. PERITONEUM AND RETROPERITONEUM: No ascites or free air. LYMPH  NODES: No lymphadenopathy. BONES AND SOFT TISSUES: Status post left hip bipolar hemi arthroplasty. Osseous structures are diffusely osteopenic. Osseous structures are age appropriate. No acute bone abnormality. No lytic or blastic bone lesion. Moderate aortoiliac atherosclerotic calcification. No focal soft tissue abnormality. IMPRESSION: 1. No pulmonary embolism. 2. Severe sigmoid diverticulosis without superimposed focal inflammatory change. No obstruction. 3. Mild dilation of the biliary tree, likely post cholecystectomy change. Correlation with liver enzymes may be helpful to exclude an obstructive or inflammatory process. 4. Trace interstitial pulmonary edema. 5. Moderate multivessel coronary artery calcification 6. Small hiatal hernia. 7. Status post cholecystectomy, right hemicolectomy, hysterectomy, and left hip bipolar hemiarthroplasty. Electronically signed by: Dorethia Molt MD 07/04/2024 02:43 AM EST RP Workstation: [id number]   DG Chest 2 View Result Date: 07/03/2024 EXAM: 2 VIEW(S) XRAY OF THE CHEST 07/03/2024 06:19:00 PM COMPARISON: 03/09/2024 CLINICAL HISTORY: cough/ crackles posterioir bases. FINDINGS: LINES, TUBES AND DEVICES: Electronic device projecting over the left chest.  LUNGS AND PLEURA: Shallow inspiration. No airspace disease or consolidation in the lungs. Atelectasis in the lung bases. No pleural effusion or pneumothorax. HEART AND MEDIASTINUM: Heart size and pulmonary vascularity are normal. Mediastinal contours appear intact. Calcification of the aorta. BONES AND SOFT TISSUES: Degenerative changes in the spine with mid-thoracic vertebral compression deformities resulting in kyphosis. Calcification of the aorta. IMPRESSION: 1. Atelectasis in the lung bases. 2. No acute process. Electronically signed by: Elsie Gravely MD 07/03/2024 06:29 PM EST RP Workstation: [id number]    Microbiology: Results for orders placed or performed during the hospital encounter of 07/03/24  Respiratory  (~20 pathogens) panel by PCR     Status: None   Collection Time: 07/03/24  7:55 PM   Specimen: Nasopharyngeal Swab; Respiratory  Result Value Ref Range Status   Adenovirus NOT DETECTED NOT DETECTED Final   Coronavirus 229E NOT DETECTED NOT DETECTED Final    Comment: (NOTE) The Coronavirus on the Respiratory Panel, DOES NOT test for the novel  Coronavirus (2019 nCoV)    Coronavirus HKU1 NOT DETECTED NOT DETECTED Final   Coronavirus NL63 NOT DETECTED NOT DETECTED Final   Coronavirus OC43 NOT DETECTED NOT DETECTED Final   Metapneumovirus NOT DETECTED NOT DETECTED Final   Rhinovirus / Enterovirus NOT DETECTED NOT DETECTED Final   Influenza A NOT DETECTED NOT DETECTED Final   Influenza B NOT DETECTED NOT DETECTED Final   Parainfluenza Virus 1 NOT DETECTED NOT DETECTED Final   Parainfluenza Virus 2 NOT DETECTED NOT DETECTED Final   Parainfluenza Virus 3 NOT DETECTED NOT DETECTED Final   Parainfluenza Virus 4 NOT DETECTED NOT DETECTED Final   Respiratory Syncytial Virus NOT DETECTED NOT DETECTED Final   Bordetella pertussis NOT DETECTED NOT DETECTED Final   Bordetella Parapertussis NOT DETECTED NOT DETECTED Final   Chlamydophila pneumoniae NOT DETECTED NOT DETECTED Final   Mycoplasma pneumoniae NOT DETECTED NOT DETECTED Final    Comment: Performed at [facility], [address], [place], [place] [address]    Labs: CBC: Recent Labs  Lab 07/03/24 1105 07/03/24 1115 07/04/24 0514  WBC 10.3  --  8.8  NEUTROABS 7.5  --   --   HGB 13.1 13.9 13.4  HCT 39.6 41.0 38.8  MCV 110.9*  --  107.2*  PLT 235  --  208   Basic Metabolic Panel: Recent Labs  Lab 07/03/24 1105 07/03/24 1115 07/04/24 0514 07/04/24 0515  NA 141 142 137  --   K 4.6 4.4 3.9  --   CL 106 106 102  --   CO2 23  --  23  --   GLUCOSE 97 103* 127*  --   BUN 16 18 16   --   CREATININE 0.93 1.00 0.77  --   CALCIUM  9.4  --  9.1  --   MG  --   --  2.0 1.9   Liver Function Tests: Recent Labs  Lab  07/03/24 1105 07/04/24 0514  AST 18 14*  ALT 7 6  ALKPHOS 119 111  BILITOT 0.3 0.3  PROT 6.8 6.5  ALBUMIN 4.1 3.9   CBG: No results for input(s): GLUCAP in the last 168 hours.  Discharge time spent: greater than 30 minutes.  Signed: Nydia Distance, MD [facility] 07/05/2024

## 2024-07-05 NOTE — Discharge Planning (Signed)
 Patient alert. IV access removed. Discharge teaching given to patient and her family. Patient and family verbalized understanding of teaching including medications. All questions were answered.  Discharge summary placed in discharge packet and placed with patient belongings. Prescriptions from [facility] pharmacy also placed with patient belongings. Patient will be transported home via family.

## 2024-07-05 NOTE — Progress Notes (Signed)
.  Heart Failure Navigator Progress Note  Assessed for Heart & Vascular TOC clinic readiness.  Patient does not meet criteria due to EF 70-75%, has a scheduled [facility] appointment on 10/15/2024. No HF TOC. .   Navigator will sign off at this time.   Stephane Haddock, BSN, Scientist, Clinical (histocompatibility And Immunogenetics) Only

## 2024-07-05 NOTE — Progress Notes (Signed)
 Mobility Specialist Progress Note:    07/05/24 1115  Mobility  Activity Ambulated with assistance (In hallway)  Level of Assistance Contact guard assist, steadying assist  Assistive Device Front wheel walker  Distance Ambulated (ft) 100 ft  Activity Response Tolerated well  Mobility Referral Yes  Mobility visit 1 Mobility  Mobility Specialist Start Time (ACUTE ONLY) 1054  Mobility Specialist Stop Time (ACUTE ONLY) 1115  Mobility Specialist Time Calculation (min) (ACUTE ONLY) 21 min   Received pt in bed and agreeable to mobility. Pt required MinG for safety. Pt c/o BLE fatigue, otherwise tolerated well. Returned to room without fault. Left pt in bed with alarm on. Personal belongings and call light within reach. All needs met.  Lavanda Pollack Mobility Specialist  Please contact via [employer] or  Rehab Office [phone]

## 2024-07-05 NOTE — Plan of Care (Signed)
  Problem: Education: Goal: Knowledge of General Education information will improve Description: Including pain rating scale, medication(s)/side effects and non-pharmacologic comfort measures Outcome: Progressing   Problem: Health Behavior/Discharge Planning: Goal: Ability to manage health-related needs will improve Outcome: Progressing   Problem: Clinical Measurements: Goal: Ability to maintain clinical measurements within normal limits will improve Outcome: Progressing   Problem: Nutrition: Goal: Adequate nutrition will be maintained Outcome: Progressing   Problem: Coping: Goal: Level of anxiety will decrease Outcome: Progressing   Problem: Safety: Goal: Ability to remain free from injury will improve Outcome: Progressing   Problem: Skin Integrity: Goal: Risk for impaired skin integrity will decrease Outcome: Progressing

## 2024-07-06 ENCOUNTER — Ambulatory Visit: Payer: Self-pay

## 2024-07-06 ENCOUNTER — Other Ambulatory Visit (HOSPITAL_BASED_OUTPATIENT_CLINIC_OR_DEPARTMENT_OTHER): Payer: Self-pay

## 2024-07-06 ENCOUNTER — Telehealth: Payer: Self-pay

## 2024-07-06 LAB — URINE CULTURE: Culture: NO GROWTH

## 2024-07-06 MED ORDER — ALBUTEROL SULFATE HFA 108 (90 BASE) MCG/ACT IN AERS
2.0000 | INHALATION_SPRAY | Freq: Four times a day (QID) | RESPIRATORY_TRACT | 0 refills | Status: AC | PRN
  Filled 2024-07-06: qty 6.7, 25d supply, fill #0

## 2024-07-06 NOTE — Telephone Encounter (Signed)
 Copied from CRM #[id number]. Topic: Clinical - Home Health Verbal Orders >> Jul 06, 2024  2:57 PM Rosina BIRCH wrote: Caller/Agency: [facility] Callback Number: [phone] Service Requested: Skilled Nursing Frequency: one time for five weeks Any new concerns about the patient? Yes, patient came home yesterday and has her IV in and need an order to get it removed

## 2024-07-06 NOTE — Transitions of Care (Post Inpatient/ED Visit) (Signed)
 "  07/06/2024  Name: Tina Riley MRN: [id number] DOB: Jul 31, 1931  Today's TOC FU Call Status: Today's TOC FU Call Status:: Successful TOC FU Call Completed TOC FU Call Complete Date: 07/06/24  Patient's Name and Date of Birth confirmed. Name, DOB  Transition Care Management Follow-up Telephone Call Date of Discharge: 07/05/24 Discharge Facility: [facility]) Type of Discharge: Inpatient Admission Primary Inpatient Discharge Diagnosis:: UTI How have you been since you were released from the hospital?: Better Any questions or concerns?: No  Items Reviewed: Did you receive and understand the discharge instructions provided?: Yes Medications obtained,verified, and reconciled?: Yes (Medications Reviewed) Any new allergies since your discharge?: No Dietary orders reviewed?: Yes Do you have support at home?: Yes People in Home [RPT]: other relative(s)  Medications Reviewed Today: Medications Reviewed Today     Reviewed by Emmitt Pan, LPN (Licensed Practical Nurse) on 07/06/24 at 1133  Med List Status: <None>   Medication Order Taking? Sig Documenting Provider Last Dose Status Informant  Acetaminophen  Extra Strength 500 MG TABS [id number] Yes Take 1 tablet (500 mg total) by mouth daily as needed for headache or mild pain.  Patient taking differently: Take 500 mg by mouth every 4 (four) hours as needed (pain).     Active Self, Pharmacy Records  albuterol  (VENTOLIN  HFA) 108 (90 Base) MCG/ACT inhaler [id number] Yes Inhale 2 puffs into the lungs every 6 (six) hours as needed for shortness of breath or wheezing. Merlynn Niki FALCON, FNP  Active Self, Pharmacy Records  cefUROXime  (CEFTIN ) 250 MG tablet [id number] Yes Take 1 tablet (250 mg total) by mouth 2 (two) times daily for 2 days. Rai, Nydia POUR, MD  Active   clonazePAM  (KLONOPIN ) 0.5 MG tablet [id number] Yes Take 1 tablet (0.5 mg total) by mouth 2 (two) times daily as needed for anxiety.  Patient taking differently: Take  0.5 mg by mouth at bedtime.   [name], MD  Active Self, Pharmacy Records  dicyclomine  (BENTYL ) 10 MG capsule [id number] Yes Take 1 capsule (10 mg total) by mouth 2 (two) times daily as needed for spasms. [name], MD  Active Self, Pharmacy Records  erythromycin  ophthalmic ointment [id number] Yes Apply a small amount to both eyes at bedtime.   Active Self, Pharmacy Records           Med Note (Tina Riley   Thu Jul 05, 2024 11:30 AM) Pt is adamant she is still using this medication every night   Fluticasone -Umeclidin-Vilant (TRELEGY ELLIPTA ) 100-62.5-25 MCG/ACT AEPB [id number] Yes Inhale 1 puff into the lungs daily. Merlynn Niki FALCON, FNP  Active Self, Pharmacy Records           Med Note (Tina Riley   Thu Jul 05, 2024 11:31 AM) Pt is adamant she is still using this medication daily. Dispense report does not support this claim.   gabapentin  (NEURONTIN ) 300 MG capsule [id number] Yes Take 1 capsule (300 mg total) by mouth at bedtime. [name], MD  Active Self, Pharmacy Records  levocetirizine (XYZAL ) 5 MG tablet [id number]  Take 1 tablet (5 mg total) by mouth daily.  Patient not taking: Reported on 07/06/2024   [name], MD  Active Self, Pharmacy Records  linaclotide  (LINZESS ) 145 MCG CAPS capsule [id number]  Take 1 capsule (145 mcg total) by mouth daily before breakfast.  Patient not taking: Reported on 07/06/2024   [name], MD  Active Self, Pharmacy Records  ondansetron  (ZOFRAN -ODT) 4 MG disintegrating tablet [id number] Yes Dissolve  1 tablet under the tongue every 6 (six) hours as needed for nausea.  Patient taking differently: Take 4 mg by mouth every 6 (six) hours as needed for nausea or vomiting.   Merlynn Niki FALCON, FNP  Active Self, Pharmacy Records  pantoprazole  (PROTONIX ) 40 MG tablet [id number] Yes Take 1 tablet (40 mg total) by mouth 2 (two) times daily. [name], MD  Active Self, Pharmacy Records  Polyethyl  Glycol-Propyl Glycol [place] [facility]) [id number] Yes Place 1 drop into both eyes daily as needed (dry eyes). [provider]  Active Self, Pharmacy Records  propranolol  ER (INDERAL  LA) 60 MG 24 hr capsule [id number] Yes Take 1 capsule (60 mg total) by mouth daily. [name], MD  Active Self, Pharmacy Records  rosuvastatin  (CRESTOR ) 5 MG tablet [id number] Yes Take 1 tablet (5 mg total) by mouth at bedtime. [name], MD  Active Self, Pharmacy Records  tamsulosin  (FLOMAX ) 0.4 MG CAPS capsule [id number] Yes Take 1 capsule (0.4 mg total) by mouth daily for 7 days. Rai, Nydia POUR, MD  Active   triamcinolone  cream (KENALOG ) 0.1 % [id number]  Apply 1 Application topically 2 (two) times daily.  Patient not taking: Reported on 07/06/2024   [name], MD  Active Self, Pharmacy Records  valACYclovir  (VALTREX ) 500 MG tablet [id number] Yes Take 2 tablets (1,000 mg total) by mouth daily.  Patient taking differently: Take 1,000 mg by mouth at bedtime.   [name], MD  Active Self, Pharmacy Records            Home Care and Equipment/Supplies: Were Home Health Services Ordered?: Yes Name of Home Health Agency:: [facility] Has Agency set up a time to come to your home?: Yes First Home Health Visit Date: 07/06/24 Any new equipment or medical supplies ordered?: NA  Functional Questionnaire: Do you need assistance with bathing/showering or dressing?: Yes Do you need assistance with meal preparation?: Yes Do you need assistance with eating?: No Do you have difficulty maintaining continence: Yes Do you need assistance with getting out of bed/getting out of a chair/moving?: No Do you have difficulty managing or taking your medications?: Yes  Follow up appointments reviewed: PCP Follow-up appointment confirmed?: Yes (patient's neice could not bring her on 07/13/24.wants this appt date) Date of PCP follow-up appointment?: 07/20/24 Follow-up Provider:  [facility] Follow-up appointment confirmed?: Yes Date of Specialist follow-up appointment?: 07/11/24 Follow-Up Specialty Provider:: uro Do you need transportation to your follow-up appointment?: No Do you understand care options if your condition(s) worsen?: Yes-patient verbalized understanding    SIGNATURE Julian Lemmings, LPN [facility] Nurse Health Advisor Direct Dial [phone]  "

## 2024-07-06 NOTE — Telephone Encounter (Signed)
 FYI Only or Action Required?: Action required by provider: Request for Verbal Orders.  Patient was last seen in primary care on 04/16/2024 by [name], MD.  Called Nurse Triage reporting Verbal Orders.  Symptoms began n/a.  Interventions attempted: Other: n/a.  Symptoms are: n/a.  Triage Disposition: Call PCP When Office is Open  Patient/caregiver understands and will follow disposition?: Yes Reason for Disposition  [1] Caller requesting NON-URGENT health information AND [2] PCP's office is the best resource  Answer Assessment - Initial Assessment Questions Please call [phone] to give verbal order to remove IV, RN is in the home for another 20-30 minutes. Please advise.   1. REASON FOR CALL: What is the main reason for your call? or How can I best help you?     [name], RN from [facility] calling to get a verbal order to remove patient's IV that she came home with from the hospital.  Protocols used: Information Only Call - No Triage-A-AH  Copied from CRM [id number]. Topic: Clinical - Red Word Triage >> Jul 06, 2024  3:03 PM Rosina BIRCH wrote: Red Word that prompted transfer to Nurse Triage: lele from [facility] called stating the patient came home yesterday and has her IV still in and she need an order to get it removed. [name] stated she can remove it while she is there

## 2024-07-06 NOTE — Telephone Encounter (Signed)
 Discussed with DOD. DOD reviewed patients recent ED note, note does not indicate why patient needed to keep the IV. Gave permission to give verbals as patients PCP is out of office.  Sending to PCP & DOD.

## 2024-07-06 NOTE — Telephone Encounter (Signed)
 Verbals given to [facility] Nurse

## 2024-07-09 NOTE — Telephone Encounter (Signed)
 Spoke with [name] notified that Dr. Rollene is out of the office and will return on Monday 12/29.

## 2024-07-10 ENCOUNTER — Telehealth: Payer: Self-pay

## 2024-07-10 NOTE — Telephone Encounter (Signed)
 Copied from CRM [id number]. Topic: Clinical - Prescription Issue >> Jul 10, 2024 10:45 AM Rea ORN wrote: Reason for CRM: Pt's aide Cloretta called at the request of the pt to ask for an rx for Symbicort . Pt stated she no longer wants to take Trelegy any more.  Please call pt back, [phone]

## 2024-07-11 ENCOUNTER — Other Ambulatory Visit (HOSPITAL_BASED_OUTPATIENT_CLINIC_OR_DEPARTMENT_OTHER): Payer: Self-pay

## 2024-07-11 MED ORDER — TAMSULOSIN HCL 0.4 MG PO CAPS
0.4000 mg | ORAL_CAPSULE | Freq: Every evening | ORAL | 11 refills | Status: AC
  Filled 2024-07-11: qty 30, 30d supply, fill #0

## 2024-07-14 ENCOUNTER — Encounter: Payer: Self-pay | Admitting: Internal Medicine

## 2024-07-17 ENCOUNTER — Other Ambulatory Visit: Payer: Self-pay | Admitting: Internal Medicine

## 2024-07-17 ENCOUNTER — Other Ambulatory Visit (HOSPITAL_COMMUNITY): Payer: Self-pay

## 2024-07-17 ENCOUNTER — Other Ambulatory Visit (HOSPITAL_BASED_OUTPATIENT_CLINIC_OR_DEPARTMENT_OTHER): Payer: Self-pay

## 2024-07-17 MED ORDER — BUDESONIDE-FORMOTEROL FUMARATE 160-4.5 MCG/ACT IN AERO
2.0000 | INHALATION_SPRAY | Freq: Two times a day (BID) | RESPIRATORY_TRACT | 3 refills | Status: AC
  Filled 2024-07-17: qty 10.2, 30d supply, fill #0

## 2024-07-17 MED ORDER — BUDESONIDE-FORMOTEROL FUMARATE 160-4.5 MCG/ACT IN AERO
2.0000 | INHALATION_SPRAY | Freq: Two times a day (BID) | RESPIRATORY_TRACT | 3 refills | Status: DC
  Filled 2024-07-17: qty 1, fill #0

## 2024-07-17 NOTE — Telephone Encounter (Signed)
"  Addressed via mychart  "

## 2024-07-17 NOTE — Telephone Encounter (Signed)
 Ok for verbals she has her follow up scheduled for Friday.

## 2024-07-19 ENCOUNTER — Other Ambulatory Visit: Payer: Self-pay | Admitting: Internal Medicine

## 2024-07-20 ENCOUNTER — Ambulatory Visit: Admitting: Internal Medicine

## 2024-07-20 ENCOUNTER — Other Ambulatory Visit: Payer: Self-pay

## 2024-07-20 ENCOUNTER — Encounter: Payer: Self-pay | Admitting: Internal Medicine

## 2024-07-20 ENCOUNTER — Other Ambulatory Visit (HOSPITAL_BASED_OUTPATIENT_CLINIC_OR_DEPARTMENT_OTHER): Payer: Self-pay

## 2024-07-20 VITALS — BP 130/80 | HR 63 | Temp 97.5°F | Ht 59.0 in | Wt 105.6 lb

## 2024-07-20 DIAGNOSIS — F419 Anxiety disorder, unspecified: Secondary | ICD-10-CM

## 2024-07-20 DIAGNOSIS — K219 Gastro-esophageal reflux disease without esophagitis: Secondary | ICD-10-CM

## 2024-07-20 DIAGNOSIS — K5900 Constipation, unspecified: Secondary | ICD-10-CM

## 2024-07-20 DIAGNOSIS — R339 Retention of urine, unspecified: Secondary | ICD-10-CM | POA: Diagnosis not present

## 2024-07-20 DIAGNOSIS — F5101 Primary insomnia: Secondary | ICD-10-CM | POA: Diagnosis not present

## 2024-07-20 MED ORDER — CLONAZEPAM 0.5 MG PO TABS
0.5000 mg | ORAL_TABLET | Freq: Three times a day (TID) | ORAL | 5 refills | Status: AC | PRN
  Filled 2024-07-20 – 2024-08-23 (×2): qty 90, 30d supply, fill #0

## 2024-07-20 MED ORDER — PANTOPRAZOLE SODIUM 40 MG PO TBEC
40.0000 mg | DELAYED_RELEASE_TABLET | Freq: Two times a day (BID) | ORAL | 3 refills | Status: AC
  Filled 2024-07-20: qty 180, 90d supply, fill #0

## 2024-07-20 NOTE — Telephone Encounter (Signed)
 Left message for [name] ok for verbal orders per Dr. Rollene.

## 2024-07-20 NOTE — Progress Notes (Signed)
 "  Subjective:   Patient ID: Tina Riley, female    DOB: 1932/01/08, 89 y.o.   MRN: [id number]  Discussed the use of AI scribe software for clinical note transcription with the patient, who gave verbal consent to proceed.  History of Present Illness Tina Riley is a 89 year old female who presents for follow-up after recent hospitalization for bladder dysfunction.  She was recently hospitalized due to bladder dysfunction and was discharged with a catheter, which has since been removed. She is currently able to urinate without difficulty. Previously, she experienced similar bladder issues and took Macrobid , which initially helped but the problem recurred. She is now on tamsulosin  (Flomax ) to aid bladder function.  She reports digestive issues and has been out of pantoprazole , which she would like to resume as her stomach has been worse, leading her to eat less. She also mentions unpredictable bowel movements, sometimes experiencing constipation followed by sudden diarrhea. She is taking Linzess  but reports minimal bowel movements.  Her sleep pattern is poor, and she has been taking clonazepam  0.5 mg, usually one tablet at night, although she is prescribed up to two tablets. She is hesitant to increase the dose due to concerns about drowsiness, especially since she lives alone. She sometimes gets up during the night to read or watch TV if she cannot sleep.  She lives alone and has been receiving physical therapy at home. She uses incontinence products for bladder and bowel control, which she finds expensive.  She recalls a recent ER visit where she had difficulty getting assistance to use the bathroom, which led to her being moved to another area of the hospital.  Review of Systems  Constitutional:  Positive for activity change, appetite change and fatigue.  HENT: Negative.    Eyes: Negative.   Respiratory:  Negative for cough, chest tightness and shortness of breath.    Cardiovascular:  Negative for chest pain, palpitations and leg swelling.  Gastrointestinal:  Positive for constipation and diarrhea. Negative for abdominal distention, abdominal pain, nausea and vomiting.  Genitourinary:  Negative for difficulty urinating.  Musculoskeletal: Negative.   Skin: Negative.   Neurological:  Positive for weakness.  Psychiatric/Behavioral:  Positive for sleep disturbance. The patient is nervous/anxious.     Objective:  Physical Exam Constitutional:      Appearance: She is well-developed.  HENT:     Head: Normocephalic and atraumatic.  Cardiovascular:     Rate and Rhythm: Normal rate and regular rhythm.  Pulmonary:     Effort: Pulmonary effort is normal. No respiratory distress.     Breath sounds: Normal breath sounds. No wheezing or rales.  Abdominal:     General: Bowel sounds are normal. There is no distension.     Palpations: Abdomen is soft.     Tenderness: There is no abdominal tenderness.  Musculoskeletal:     Cervical back: Normal range of motion.  Skin:    General: Skin is warm and dry.  Neurological:     Mental Status: She is alert and oriented to person, place, and time.     Coordination: Coordination abnormal.     Comments: wheelchair     Vitals:   07/20/24 1040  BP: 130/80  Pulse: 63  Temp: (!) 97.5 F (36.4 C)  TempSrc: Oral  SpO2: 98%  Weight: 105 lb 9.6 oz (47.9 kg)  Height: 4' 11 (1.499 m)    Assessment and Plan Assessment & Plan Urinary retention   She was recently hospitalized for urinary  retention and had a catheter placed, which has now been removed. She can urinate without difficulty. A follow-up with the urologist is scheduled for next week. She is encouraged to continue tamsulosin . Continue home health PT.   Chronic constipation with post-colon surgery changes   She experiences chronic constipation with unpredictable bowel movements following colon surgery. Currently, she takes Linzess  but has minimal bowel  movements. Episodes of diarrhea occur after unexpected food intake, likely due to hard stool impaction causing liquid stool passage. Continue Linzess  as prescribed.  Insomnia  and anxiety disorder She suffers from chronic insomnia and is on a clonazepam  regimen of 0.5 mg at night. We will increase to 0.75 mg clonazepam  at night time and additional 0.5 mg during the daytime for anxiety. She has tried daily regimen which was ineffective and caused side effects so she does not want to re-trial.   GERD Her chronic gastrointestinal symptoms have worsened since stopping pantoprazole . Restart pantoprazole  as previously prescribed.    "

## 2024-07-24 ENCOUNTER — Other Ambulatory Visit (HOSPITAL_BASED_OUTPATIENT_CLINIC_OR_DEPARTMENT_OTHER): Payer: Self-pay

## 2024-07-25 ENCOUNTER — Other Ambulatory Visit: Payer: Self-pay

## 2024-07-25 ENCOUNTER — Other Ambulatory Visit (HOSPITAL_BASED_OUTPATIENT_CLINIC_OR_DEPARTMENT_OTHER): Payer: Self-pay

## 2024-07-25 ENCOUNTER — Emergency Department (HOSPITAL_COMMUNITY)
Admission: EM | Admit: 2024-07-25 | Discharge: 2024-07-26 | Discharge status: Against Medical Advice | Attending: Emergency Medicine | Admitting: Emergency Medicine

## 2024-07-25 ENCOUNTER — Encounter (HOSPITAL_COMMUNITY): Payer: Self-pay

## 2024-07-25 ENCOUNTER — Emergency Department (HOSPITAL_COMMUNITY)

## 2024-07-25 DIAGNOSIS — Z5321 Procedure and treatment not carried out due to patient leaving prior to being seen by health care provider: Secondary | ICD-10-CM | POA: Diagnosis not present

## 2024-07-25 DIAGNOSIS — R4182 Altered mental status, unspecified: Secondary | ICD-10-CM | POA: Diagnosis present

## 2024-07-25 LAB — CBC
HCT: 37.6 % (ref 36.0–46.0)
Hemoglobin: 12.6 g/dL (ref 12.0–15.0)
MCH: 37.2 pg — ABNORMAL HIGH (ref 26.0–34.0)
MCHC: 33.5 g/dL (ref 30.0–36.0)
MCV: 110.9 fL — ABNORMAL HIGH (ref 80.0–100.0)
Platelets: 237 K/uL (ref 150–400)
RBC: 3.39 MIL/uL — ABNORMAL LOW (ref 3.87–5.11)
RDW: 13.6 % (ref 11.5–15.5)
WBC: 6.3 K/uL (ref 4.0–10.5)
nRBC: 0 % (ref 0.0–0.2)

## 2024-07-25 LAB — COMPREHENSIVE METABOLIC PANEL WITH GFR
ALT: 10 U/L (ref 0–44)
AST: 17 U/L (ref 15–41)
Albumin: 4.2 g/dL (ref 3.5–5.0)
Alkaline Phosphatase: 109 U/L (ref 38–126)
Anion gap: 11 (ref 5–15)
BUN: 16 mg/dL (ref 8–23)
CO2: 25 mmol/L (ref 22–32)
Calcium: 9.3 mg/dL (ref 8.9–10.3)
Chloride: 106 mmol/L (ref 98–111)
Creatinine, Ser: 0.95 mg/dL (ref 0.44–1.00)
GFR, Estimated: 57 mL/min — ABNORMAL LOW
Glucose, Bld: 97 mg/dL (ref 70–99)
Potassium: 4.5 mmol/L (ref 3.5–5.1)
Sodium: 141 mmol/L (ref 135–145)
Total Bilirubin: 0.3 mg/dL (ref 0.0–1.2)
Total Protein: 6.9 g/dL (ref 6.5–8.1)

## 2024-07-25 MED ORDER — ESTRADIOL 0.01 % VA CREA
TOPICAL_CREAM | VAGINAL | 3 refills | Status: AC
  Filled 2024-07-25: qty 42.5, 90d supply, fill #0

## 2024-07-25 NOTE — ED Triage Notes (Signed)
 180/80 74HR 96% 119-CBG

## 2024-07-25 NOTE — ED Provider Triage Note (Signed)
 Emergency Medicine Provider Triage Evaluation Note  Tina Riley , a 89 y.o. female  was evaluated in triage.  Pt complains of abnormal sensation on the right side of her face. Hx obtained via family member. Yesterday went to urologist. R head and R face felt funny starting yesterday afternoon at 730pm. Felt confused at that time and had some difficulty remembering numbers.   Review of Systems  Positive:  Negative:   Physical Exam  BP (!) 175/72 (BP Location: Left Arm)   Pulse 68   Temp 98.2 F (36.8 C)   Resp 16   Wt 48.5 kg   BMI 21.61 kg/m  Gen:   Awake, no distress   Resp:  Normal effort  MSK:   Moves extremities without difficulty Other:   NIHSS Exam  Level of Consciousness: Alert  LOC Questions: Answers Month and Age Correctly  LOC Commands: Opens and Closes Eyes and Hands on command  Best Gaze: Horizontal ocular movements intact  Visual Fields: No visual field loss  Facial Palsy: None  L Upper Extremity Motor: No drift after 10 seconds  R Upper Extremity Motor: No drift after 10 seconds  L Lower extremity Motor: No drift after 5 seconds  R Lower extremity Motor: No drift after 5 seconds  Ataxia: Absent  Sensory: Intact sensation to light touch on face, arms, trunk, and legs bilaterally  Best Language: No aphasia  Dysarthria: No dysarthria  Neglect: No visual or sensory neglect   Medical Decision Making  Medically screening exam initiated at 2:43 PM.  Appropriate orders placed.  Tina Riley was informed that the remainder of the evaluation will be completed by another provider, this initial triage assessment does not replace that evaluation, and the importance of remaining in the ED until their evaluation is complete.  OK to go to waiting room if family member is with her.    Tina Lamar BROCKS, MD 07/25/24 [phone]

## 2024-07-25 NOTE — ED Notes (Signed)
 Pt stated that she is leaving. Wristband removed and patient wheeled out by family. Moved OTF.

## 2024-07-25 NOTE — ED Triage Notes (Signed)
 BIB Ems/ caregiver reported that pt last known well was on Monday/ more confused yesterday today pt was unable to complete simple tasks/ pt c/o HA, R side facial tingling, equal smile/ hx of TIA/ pt is A&OX4 at this time

## 2024-07-30 ENCOUNTER — Other Ambulatory Visit (HOSPITAL_BASED_OUTPATIENT_CLINIC_OR_DEPARTMENT_OTHER): Payer: Self-pay

## 2024-08-17 ENCOUNTER — Ambulatory Visit: Payer: Self-pay

## 2024-08-17 NOTE — Telephone Encounter (Signed)
 I would make sure she is taking her linzess  daily. Okay to add maalox or tums after eating along with bentyl . Also make sure she is taking protonix  twice a day. If not increase to twice a day.

## 2024-08-17 NOTE — Telephone Encounter (Signed)
 Spoke with caregiver and relayed message per Dr. Rollene. Caregiver stated pt is taking Liness daily and Protonix  BID.

## 2024-08-17 NOTE — Telephone Encounter (Signed)
 FYI Only or Action Required?: Action required by provider: update on patient condition and asking for additional recommendations than Bentyl .  Patient was last seen in primary care on 07/20/2024 by [name], MD.  Called Nurse Triage reporting Abdominal Pain.  Symptoms began yesterday.  Interventions attempted: Rest, hydration, or home remedies.  Symptoms are: gradually worsening.  Triage Disposition: See Physician Within 24 Hours  Patient/caregiver understands and will follow disposition?: Yes  Message from Ahlexyia S sent at 08/17/2024 11:19 AM EST  Reason for Triage: Pt called in stating that she is having severe abdominal pain. Pt stated that she took dicyclomine  (BENTYL ) 10 MG capsule for pain but then It comes right back. Pt denied any other symptoms. Warm transferred to nurse triage.   Reason for Disposition  [1] MODERATE pain (e.g., interferes with normal activities) AND [2] pain comes and goes (cramps) AND [3] present > 24 hours  (Exception: Pain with Vomiting or Diarrhea - see that Guideline.)  Answer Assessment - Initial Assessment Questions Patient calling to alert provider she is in a IBS flare- Last night started having abdominal cramping/pain in the Left side some into the back. Hits after she eats, cramping 8-9/10, feels bloated but denies distension. Bentyl  and BM give relief and then it comes back when she eats again.   Asking if Dr Rollene has any further recommendations than Bentyl  to help with cramping pain over the weekend. Pharmacy confirmed  1. LOCATION: Where does it hurt?      Left side at waistline  2. RADIATION: Does the pain shoot anywhere else? (e.g., chest, back)     denies 3. ONSET: When did the pain begin? (e.g., minutes, hours or days ago)      Yesterday  4. SUDDEN: Gradual or sudden onset?     When she eats 5. PATTERN Does the pain come and go, or is it constant?     Coming and going but because she is taking Bentyl   6.  SEVERITY: How bad is the pain?  (e.g., Scale 1-10; mild, moderate, or severe)     8-9/10 at worst, eases off  7. RECURRENT SYMPTOM: Have you ever had this type of stomach pain before? If Yes, ask: When was the last time? and What happened that time?      IBS for years  8. CAUSE: What do you think is causing the stomach pain? (e.g., gallstones, recent abdominal surgery)     IBS 9. RELIEVING/AGGRAVATING FACTORS: What makes it better or worse? (e.g., antacids, bending or twisting motion, bowel movement)     BM and feels better and with meds  10. OTHER SYMPTOMS: Do you have any other symptoms? (e.g., back pain, diarrhea, fever, urination pain, vomiting)       Denies  Protocols used: Abdominal Pain - Female-A-AH

## 2024-08-23 ENCOUNTER — Other Ambulatory Visit (HOSPITAL_BASED_OUTPATIENT_CLINIC_OR_DEPARTMENT_OTHER): Payer: Self-pay

## 2024-08-23 ENCOUNTER — Other Ambulatory Visit: Payer: Self-pay

## 2024-09-12 ENCOUNTER — Ambulatory Visit: Admitting: Podiatry

## 2024-10-15 ENCOUNTER — Ambulatory Visit: Admitting: Internal Medicine

## 2025-01-22 ENCOUNTER — Ambulatory Visit: Admitting: Internal Medicine

## 2025-05-20 ENCOUNTER — Ambulatory Visit
